# Patient Record
Sex: Male | Born: 1950 | Race: White | Hispanic: No | Marital: Single | Smoking: Never smoker
Health system: Southern US, Community
[De-identification: ages and names within clinical notes are randomized; demographics above are authoritative.]

## PROBLEM LIST (undated history)

## (undated) ENCOUNTER — Telehealth

## (undated) ENCOUNTER — Encounter: Attending: Family | Primary: Family

## (undated) ENCOUNTER — Telehealth
Attending: Pharmacist Clinician (PhC)/ Clinical Pharmacy Specialist | Primary: Pharmacist Clinician (PhC)/ Clinical Pharmacy Specialist

## (undated) ENCOUNTER — Ambulatory Visit: Payer: MEDICARE

## (undated) ENCOUNTER — Non-Acute Institutional Stay: Payer: MEDICARE

## (undated) ENCOUNTER — Telehealth: Attending: Family | Primary: Family

## (undated) ENCOUNTER — Encounter: Attending: "Endocrinology | Primary: "Endocrinology

## (undated) ENCOUNTER — Ambulatory Visit

## (undated) ENCOUNTER — Encounter: Attending: Radiation Oncology | Primary: Radiation Oncology

## (undated) ENCOUNTER — Ambulatory Visit: Payer: MEDICARE | Attending: "Endocrinology | Primary: "Endocrinology

## (undated) ENCOUNTER — Ambulatory Visit: Attending: Neurology | Primary: Neurology

## (undated) ENCOUNTER — Encounter

## (undated) ENCOUNTER — Encounter: Payer: MEDICARE | Attending: Family | Primary: Family

## (undated) ENCOUNTER — Ambulatory Visit: Payer: Medicare (Managed Care)

## (undated) ENCOUNTER — Telehealth
Attending: Student in an Organized Health Care Education/Training Program | Primary: Student in an Organized Health Care Education/Training Program

## (undated) ENCOUNTER — Encounter: Attending: Gastroenterology | Primary: Gastroenterology

## (undated) ENCOUNTER — Ambulatory Visit: Attending: Radiation Oncology | Primary: Radiation Oncology

## (undated) ENCOUNTER — Encounter: Attending: Audiologist | Primary: Audiologist

## (undated) ENCOUNTER — Ambulatory Visit: Payer: Medicare (Managed Care) | Attending: "Endocrinology | Primary: "Endocrinology

## (undated) ENCOUNTER — Ambulatory Visit: Payer: MEDICARE | Attending: Pulmonary Disease | Primary: Pulmonary Disease

## (undated) ENCOUNTER — Telehealth: Attending: Neurology | Primary: Neurology

## (undated) ENCOUNTER — Telehealth: Attending: Radiation Oncology | Primary: Radiation Oncology

## (undated) ENCOUNTER — Encounter
Attending: Student in an Organized Health Care Education/Training Program | Primary: Student in an Organized Health Care Education/Training Program

## (undated) ENCOUNTER — Telehealth: Attending: Anesthesiology | Primary: Anesthesiology

## (undated) ENCOUNTER — Ambulatory Visit: Attending: Sports Medicine | Primary: Sports Medicine

## (undated) ENCOUNTER — Encounter: Attending: Neurological Surgery | Primary: Neurological Surgery

## (undated) ENCOUNTER — Encounter: Payer: MEDICARE | Attending: Clinical | Primary: Clinical

## (undated) ENCOUNTER — Ambulatory Visit: Payer: Medicare (Managed Care) | Attending: Otology & Neurotology | Primary: Otology & Neurotology

## (undated) ENCOUNTER — Ambulatory Visit
Attending: Pharmacist Clinician (PhC)/ Clinical Pharmacy Specialist | Primary: Pharmacist Clinician (PhC)/ Clinical Pharmacy Specialist

## (undated) ENCOUNTER — Encounter
Payer: MEDICARE | Attending: Student in an Organized Health Care Education/Training Program | Primary: Student in an Organized Health Care Education/Training Program

## (undated) ENCOUNTER — Encounter: Payer: MEDICARE | Attending: Gastroenterology | Primary: Gastroenterology

## (undated) ENCOUNTER — Encounter: Attending: General Practice | Primary: General Practice

## (undated) ENCOUNTER — Ambulatory Visit: Attending: Rheumatology | Primary: Rheumatology

## (undated) ENCOUNTER — Ambulatory Visit: Payer: Medicare (Managed Care) | Attending: Radiation Oncology | Primary: Radiation Oncology

## (undated) ENCOUNTER — Ambulatory Visit: Attending: Family Medicine | Primary: Family Medicine

## (undated) ENCOUNTER — Encounter: Attending: Sports Medicine | Primary: Sports Medicine

## (undated) ENCOUNTER — Encounter: Attending: Pain Medicine | Primary: Pain Medicine

## (undated) ENCOUNTER — Encounter: Attending: Otology & Neurotology | Primary: Otology & Neurotology

## (undated) ENCOUNTER — Encounter
Attending: Pharmacist Clinician (PhC)/ Clinical Pharmacy Specialist | Primary: Pharmacist Clinician (PhC)/ Clinical Pharmacy Specialist

## (undated) ENCOUNTER — Encounter: Payer: MEDICARE | Attending: Hand Surgery | Primary: Hand Surgery

## (undated) ENCOUNTER — Ambulatory Visit: Payer: MEDICARE | Attending: Orthopaedic Surgery | Primary: Orthopaedic Surgery

## (undated) DIAGNOSIS — I442 Atrioventricular block, complete: Secondary | ICD-10-CM

## (undated) DIAGNOSIS — Z950 Presence of cardiac pacemaker: Secondary | ICD-10-CM

## (undated) DIAGNOSIS — K759 Inflammatory liver disease, unspecified: Secondary | ICD-10-CM

## (undated) DIAGNOSIS — B1920 Unspecified viral hepatitis C without hepatic coma: Secondary | ICD-10-CM

## (undated) HISTORY — DX: Atrioventricular block, complete: I44.2

## (undated) HISTORY — PX: TONSILLECTOMY: SUR1361

## (undated) HISTORY — DX: Presence of cardiac pacemaker: Z95.0

## (undated) HISTORY — PX: LIVER BIOPSY: SHX301

## (undated) MED ORDER — ALENDRONATE 70 MG TABLET: ORAL | 0 days

## (undated) MED ORDER — OMEGA 3-DHA-EPA-FISH OIL 1,000 MG (120 MG-180 MG) CAPSULE: ORAL | 0 days

## (undated) MED ORDER — VITAMIN B COMPLEX ER TABLET,EXTENDED RELEASE: Freq: Every day | ORAL | 0 days

## (undated) MED ORDER — CHOLECALCIFEROL (VITAMIN D3) 250 MCG (10,000 UNIT) CAPSULE: ORAL | 0 days

## (undated) MED ORDER — DICLOFENAC 1 % TOPICAL GEL: Freq: Four times a day (QID) | TOPICAL | 0 days | PRN

---

## 1980-06-21 HISTORY — PX: KNEE ARTHROSCOPY: SUR90

## 2008-12-12 ENCOUNTER — Inpatient Hospital Stay: Payer: Self-pay | Admitting: Specialist

## 2017-01-10 ENCOUNTER — Encounter: Payer: Self-pay | Admitting: Podiatry

## 2017-01-10 ENCOUNTER — Ambulatory Visit (INDEPENDENT_AMBULATORY_CARE_PROVIDER_SITE_OTHER): Payer: Self-pay | Admitting: Podiatry

## 2017-01-10 ENCOUNTER — Ambulatory Visit: Payer: Self-pay

## 2017-01-10 VITALS — BP 110/70 | HR 72 | Resp 16

## 2017-01-10 DIAGNOSIS — M722 Plantar fascial fibromatosis: Secondary | ICD-10-CM

## 2017-01-10 DIAGNOSIS — M79673 Pain in unspecified foot: Secondary | ICD-10-CM

## 2017-01-10 NOTE — Progress Notes (Signed)
   Subjective:    Patient ID: Devin Hale, male    DOB: 10-04-1950, 66 y.o.   MRN: [id number]  HPI he presents today with chief complaint of pain to his arches bilaterally. Stasis been going on now for the past several months. He said the right one seems to be bothering him worse than the left one. He wears old orthotics that are about 64 years old. He states that he has followed arches and takes naproxen daily he also has pain to the medial aspect of the longitudinal arch left foot. He denies trauma.    Review of Systems  HENT: Positive for sinus pressure.   All other systems reviewed and are negative.      Objective:   Physical Exam: Vital signs are stable alert and oriented 3. Pulses are palpable. Neurologic sensorium is intact. Deep tendon reflexes are intact and muscle strength is normal. He does have some fluctuance of the posterior tibial tendon left and of the tibialis anterior tendon both at their insertion sites on the navicular and the medial cuneiforms respectively. There is some soft tissue swelling in that area. Otherwise no reproducible pain bilateral. Radiographs taken today do not demonstrate any type of osseus abnormalities in this area. Tenderness on palpation medially. Tubercle bilateral.        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 Plantar fasciitis bilateral.  Plan: He was casted for orthotics.

## 2017-01-26 ENCOUNTER — Encounter: Payer: Self-pay | Admitting: *Deleted

## 2017-02-09 ENCOUNTER — Ambulatory Visit: Payer: Self-pay | Admitting: Orthotics

## 2017-02-09 DIAGNOSIS — M722 Plantar fascial fibromatosis: Secondary | ICD-10-CM

## 2017-02-09 NOTE — Progress Notes (Signed)
Patient picked up F/O.  Fit well.  Patient argumentative about balance owed.  Will pay $76 in payments.

## 2017-03-15 ENCOUNTER — Telehealth: Payer: Self-pay | Admitting: Podiatry

## 2017-03-15 NOTE — Telephone Encounter (Signed)
Pt called asking about his orthotics that he got he has put it in his new balance but did not remove the other insert prior to wearing. He asked if it was ok to buy a very thin insert to wear since his orthotics are not full length.  I told pt he needed to take the other insert out and try the orthotics that way. I also talked with Liliane Channel and he said it would be ok for him to get a very thin insert to wear just nothing with support.   I have left a message for pt stating this and asked him to call if any further questions.

## 2017-06-21 HISTORY — PX: NASAL SINUS SURGERY: SHX719

## 2017-10-25 DIAGNOSIS — R7989 Other specified abnormal findings of blood chemistry: Secondary | ICD-10-CM | POA: Insufficient documentation

## 2017-10-25 DIAGNOSIS — F419 Anxiety disorder, unspecified: Secondary | ICD-10-CM | POA: Insufficient documentation

## 2017-11-10 DIAGNOSIS — R768 Other specified abnormal immunological findings in serum: Secondary | ICD-10-CM | POA: Insufficient documentation

## 2017-11-10 DIAGNOSIS — M2550 Pain in unspecified joint: Secondary | ICD-10-CM | POA: Insufficient documentation

## 2017-11-10 DIAGNOSIS — R600 Localized edema: Secondary | ICD-10-CM | POA: Insufficient documentation

## 2017-11-23 DIAGNOSIS — M170 Bilateral primary osteoarthritis of knee: Secondary | ICD-10-CM | POA: Insufficient documentation

## 2017-11-23 DIAGNOSIS — M25361 Other instability, right knee: Secondary | ICD-10-CM | POA: Insufficient documentation

## 2017-11-25 ENCOUNTER — Telehealth: Payer: Self-pay | Admitting: Gastroenterology

## 2017-11-25 NOTE — Telephone Encounter (Signed)
Left voice message with patient that we received his voice message. We were calling  due to a cancellation with Dr. Allen Norris. That appt was filled and we will continue to call if we have any other openings.  I also told him if he was calling regarding another matter to please call us back.

## 2017-12-01 ENCOUNTER — Telehealth: Payer: Self-pay | Admitting: Gastroenterology

## 2017-12-01 NOTE — Telephone Encounter (Signed)
Offered pt 12/28/17 apt for sooner apt pt declined

## 2017-12-12 ENCOUNTER — Encounter: Admit: 2017-12-12 | Discharge: 2017-12-12 | Payer: MEDICARE

## 2017-12-12 ENCOUNTER — Ambulatory Visit: Admit: 2017-12-12 | Discharge: 2017-12-12 | Payer: MEDICARE

## 2017-12-12 DIAGNOSIS — M79641 Pain in right hand: Principal | ICD-10-CM

## 2017-12-12 DIAGNOSIS — M79642 Pain in left hand: Secondary | ICD-10-CM

## 2017-12-12 DIAGNOSIS — M542 Cervicalgia: Principal | ICD-10-CM

## 2017-12-26 ENCOUNTER — Telehealth: Payer: Self-pay | Admitting: *Deleted

## 2017-12-26 NOTE — Telephone Encounter (Signed)
Copied from [place] [id number]. Topic: General - Other >> Dec 16, 2017 11:49 AM Yvette Rack wrote: Reason for CRM: pt calling to speak with practice Administrator wanting to know more about practice and Dr [name] she would like to be a new patient here   >> Dec 20, 2017 11:01 AM Bea Graff, NT wrote: Pt would like to speak with Devin Hale regarding speaking to him last week on 12/16/17.  >> Dec 26, 2017  1:27 PM Valla Leaver wrote: Devin Hale calling back to speak with practice [name]. He says he's called twice and has not heard back.

## 2017-12-27 ENCOUNTER — Telehealth: Payer: Self-pay

## 2017-12-27 NOTE — Telephone Encounter (Signed)
Entered in error

## 2017-12-28 ENCOUNTER — Ambulatory Visit: Payer: Medicare Other | Admitting: Gastroenterology

## 2018-01-02 ENCOUNTER — Ambulatory Visit: Payer: Medicare Other | Admitting: Gastroenterology

## 2018-01-03 ENCOUNTER — Encounter

## 2018-01-03 ENCOUNTER — Ambulatory Visit: Payer: Medicare Other | Admitting: Gastroenterology

## 2018-01-18 ENCOUNTER — Ambulatory Visit: Admit: 2018-01-18 | Discharge: 2018-01-18 | Payer: MEDICARE | Attending: Gastroenterology | Primary: Gastroenterology

## 2018-01-18 DIAGNOSIS — B182 Chronic viral hepatitis C: Principal | ICD-10-CM

## 2018-01-18 DIAGNOSIS — R198 Other specified symptoms and signs involving the digestive system and abdomen: Secondary | ICD-10-CM

## 2018-01-18 DIAGNOSIS — Z1159 Encounter for screening for other viral diseases: Secondary | ICD-10-CM

## 2018-01-18 LAB — COMPREHENSIVE METABOLIC PANEL
ALBUMIN: 4.1 g/dL (ref 3.5–5.0)
ALKALINE PHOSPHATASE: 115 U/L (ref 38–126)
ANION GAP: 8 mmol/L — ABNORMAL LOW (ref 9–15)
AST (SGOT): 97 U/L — ABNORMAL HIGH (ref 17–47)
BILIRUBIN TOTAL: 0.9 mg/dL (ref 0.0–1.2)
BLOOD UREA NITROGEN: 17 mg/dL (ref 7–21)
BUN / CREAT RATIO: 23
CALCIUM: 9.6 mg/dL (ref 8.5–10.2)
CHLORIDE: 106 mmol/L (ref 98–107)
CO2: 26 mmol/L (ref 22.0–30.0)
CREATININE: 0.73 mg/dL (ref 0.70–1.30)
EGFR CKD-EPI AA MALE: >90 mL/min/{1.73_m2} (ref >=60)
EGFR CKD-EPI NON-AA MALE: >90 mL/min/{1.73_m2} (ref >=60)
GLUCOSE RANDOM: 90 mg/dL (ref 65–99)
POTASSIUM: 3.9 mmol/L (ref 3.5–5.0)
PROTEIN TOTAL: 7.6 g/dL (ref 6.5–8.3)
SODIUM: 140 mmol/L (ref 135–145)

## 2018-01-18 LAB — CBC W/ AUTO DIFF
EOSINOPHILS ABSOLUTE COUNT: 0.1 10*9/L (ref 0.0–0.4)
EOSINOPHILS RELATIVE PERCENT: 1.3 %
HEMATOCRIT: 45.1 % (ref 41.0–53.0)
HEMOGLOBIN: 14.5 g/dL (ref 13.5–17.5)
LARGE UNSTAINED CELLS: 1 % (ref 0–4)
LYMPHOCYTES ABSOLUTE COUNT: 1.5 10*9/L (ref 1.5–5.0)
LYMPHOCYTES RELATIVE PERCENT: 18.8 %
MEAN CORPUSCULAR HEMOGLOBIN: 27.7 pg (ref 26.0–34.0)
MEAN CORPUSCULAR VOLUME: 85.9 fL (ref 80.0–100.0)
MEAN PLATELET VOLUME: 8.1 fL (ref 7.0–10.0)
MONOCYTES ABSOLUTE COUNT: 0.4 10*9/L (ref 0.2–0.8)
MONOCYTES RELATIVE PERCENT: 5.1 %
NEUTROPHILS RELATIVE PERCENT: 73 %
PLATELET COUNT: 152 10*9/L (ref 150–440)
RED BLOOD CELL COUNT: 5.25 10*12/L (ref 4.50–5.90)
RED CELL DISTRIBUTION WIDTH: 13.7 % (ref 12.0–15.0)
WBC ADJUSTED: 8.2 10*9/L (ref 4.5–11.0)

## 2018-01-18 LAB — THYROID STIMULATING HORMONE: Thyrotropin:ACnc:Pt:Ser/Plas:Qn:: 1.634

## 2018-01-18 LAB — RHEUMATOID FACTOR: Rheumatoid factor:ACnc:Pt:Ser/Plas:Qn:: <8.6

## 2018-01-18 LAB — FERRITIN: Ferritin:MCnc:Pt:Ser/Plas:Qn:: 262

## 2018-01-18 LAB — BASOPHILS RELATIVE PERCENT: Lab: 0.7

## 2018-01-18 LAB — PROTIME: Lab: 13.3 — ABNORMAL HIGH

## 2018-01-18 LAB — ANION GAP: Anion gap 3:SCnc:Pt:Ser/Plas:Qn:: 8 — ABNORMAL LOW

## 2018-01-18 LAB — TOTAL IRON BINDING CAPACITY (CALC): Lab: 339.2

## 2018-01-18 LAB — IRON & TIBC
IRON: 75 ug/dL (ref 35–165)
TRANSFERRIN: 269.2 mg/dL (ref 200.0–380.0)

## 2018-01-18 LAB — AFP-TUMOR MARKER: Alpha-1-Fetoprotein.tumor marker:MCnc:Pt:Ser/Plas:Qn:: 7

## 2018-01-18 LAB — ERYTHROCYTE SEDIMENTATION RATE: Lab: 13

## 2018-01-18 LAB — CREATINE KINASE TOTAL: Creatine kinase:CCnc:Pt:Ser/Plas:Qn:: 52

## 2018-01-18 NOTE — Unmapped (Signed)
[facility]    Peter Mejia, M.D.  Professor of Medicine  Director, [facility]  [place] [facility] [place] [facility] [place]    [phone]    Peter Mejia    Peter Redwood, MD  [address]  [place], [place] [address]     Chief complaint: Patient referred for consultation for chronic hepatitis C genotype 1a.    Present illness: Patient is a 67 y.o. Caucasian male with chronic hepatitis C for at least 30 years.  He was found to have an abnormal ALT in the 1970s.  Interestingly, he was treated at the [facility] and I evaluated him there during my hepatology fellowship between [phone].  At the [facility] by his description, he was treated with interferon monotherapy which she did not tolerate well and then he was treated with ribavirin monotherapy.    Since that time he has had periodic laboratory testing but did not get treatment for hepatitis C.  He states he did well until sometime in December when he had a marked change in his clinical status.  He developed extensive fatigue, mild right upper quadrant discomfort, generalized pain manifested by myalgias and arthralgias, as well as generalized muscle weakness.  He denies nausea, vomiting, abdominal pain, chest pain, or shortness of breath.  Patient states he was seen by a rheumatologist at the [facility] clinic I do have some serologies available to me which were negative although I do not have a full consultation notes at this time.     After the patient developed all the symptoms, he called the [facility] and spoke to Dr. Nickola Major who recommended that he be evaluated at [facility].    10 system review of systems shows numerous somatic complaints as described above.    Past medical history:  1.  Resolved depression  2.  No history of diabetes, coronary artery disease, lung disease, or hypertension  4.  No prior hospitalizations  5.  Immune to hepatitis A  6.  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     No Known Allergies    Current Outpatient Medications   Medication Sig Dispense Refill   ??? NAPROXEN, BULK, MISC 400 mg.       No current facility-administered medications for this visit.      Social history: The patient is not married.  He does not have any children.  He formerly did some work from [facility] but stopped last year when he started feeling poorly.  He does not smoke cigarettes or drink alcohol.    Family history: Negative for liver disease or liver cancer.    BP 115/75  - Pulse 70  - Temp 37 ??C (98.6 ??F)  - Resp 20  - Ht 177.8 cm (5' 10)  - Wt 75.8 kg (167 lb)  - SpO2 98%  - BMI 23.96 kg/m??     Pleasant individual in NAD    HEENT: Sclera are anicteric, no temporal muscle loss, oropharynx is negative  NECK: No thyromegaly or lymphadenopathy, No carotid bruits  Chest: Clear to auscultation and percussion  Heart: S1, S2, RR, No murmurs  Abdomen: Soft, non-tender, non-distended, no hepatosplenomegaly, no masses appreciated, no ascites  Skin: No spider angiomata, No rashes  Extremities: Without pedal edema, no palmar erythema, no muscle tenderness.  Neuro: Grossly intact, No focal deficits, strength 3/4 in lower and upper extremities.      Results for orders placed or performed in visit  on 01/18/18   Rheumatoid Factor   Result Value Ref Range    Rheumatoid Factor <8.6 0.0 - 15.0 IU/mL   CK   Result Value Ref Range    Creatine Kinase, Total 52.0 45.0 - 250.0 U/L   Comprehensive Metabolic Panel   Result Value Ref Range    Sodium 140 135 - 145 mmol/L    Potassium 3.9 3.5 - 5.0 mmol/L    Chloride 106 98 - 107 mmol/L    CO2 26.0 22.0 - 30.0 mmol/L    Anion Gap 8 (L) 9 - 15 mmol/L    BUN 17 7 - 21 mg/dL    Creatinine 1.61 0.96 - 1.30 mg/dL    BUN/Creatinine Ratio 23     EGFR CKD-EPI Non-African American, Male >90 >=60 mL/min/1.60m2    EGFR CKD-EPI African American, Male >90 >=60 mL/min/1.55m2    Glucose 90 65 - 99 mg/dL    Calcium 9.6 8.5 - 04.5 mg/dL    Albumin 4.1 3.5 - 5.0 g/dL    Total Protein 7.6 6.5 - 8.3 g/dL    Total Bilirubin 0.9 0.0 - 1.2 mg/dL    AST 97 (H) 17 - 47 U/L    ALT 74 (H) 13 - 69 U/L    Alkaline Phosphatase 115 38 - 126 U/L   PT-INR   Result Value Ref Range    PT 13.3 (H) 10.2 - 12.8 sec    INR 1.17    Ferritin   Result Value Ref Range    Ferritin 262.0 27.0 - 377.0 ng/mL   Iron Level and TIBC   Result Value Ref Range    Iron 75 35 - 165 ug/dL    TIBC 409.8 119.1 - 478.2 mg/dL    Transferrin 956.2 130.8 - 380.0 mg/dL    Iron Saturation (%) 22 20 - 50 %   AFP tumor marker   Result Value Ref Range    AFP-Tumor Marker 7 <8 ng/mL   Hepatitis B Surface Antigen   Result Value Ref Range    Hepatitis B Surface Ag Nonreactive Nonreactive   Hepatitis B Core Antibody, total   Result Value Ref Range    Hep B Core Total Ab Nonreactive Nonreactive   Hepatitis B Surface Antibody   Result Value Ref Range    Hep B S Ab Reactive (A) Nonreactive, Grayzone    Hepatitis B Surface Ab Quant 16.12 (H) <8.00 m(IU)/mL   ESR   Result Value Ref Range    Sed Rate 13 0 - 20 mm/h   TSH   Result Value Ref Range    TSH 1.634 0.600 - 3.300 uIU/mL   CBC w/ Differential   Result Value Ref Range    WBC 8.2 4.5 - 11.0 10*9/L    RBC 5.25 4.50 - 5.90 10*12/L    HGB 14.5 13.5 - 17.5 g/dL    HCT 65.7 84.6 - 96.2 %    MCV 85.9 80.0 - 100.0 fL    MCH 27.7 26.0 - 34.0 pg    MCHC 32.3 31.0 - 37.0 g/dL    RDW 95.2 84.1 - 32.4 %    MPV 8.1 7.0 - 10.0 fL    Platelet 152 150 - 440 10*9/L    Neutrophils % 73.0 %    Lymphocytes % 18.8 %    Monocytes % 5.1 %    Eosinophils % 1.3 %    Basophils % 0.7 %    Absolute Neutrophils 6.0 2.0 - 7.5 10*9/L  Absolute Lymphocytes 1.5 1.5 - 5.0 10*9/L    Absolute Monocytes 0.4 0.2 - 0.8 10*9/L    Absolute Eosinophils 0.1 0.0 - 0.4 10*9/L    Absolute Basophils 0.1 0.0 - 0.1 10*9/L    Large Unstained Cells 1 0 - 4 %         Impression:  1.  Chronic hepatitis C genotype 1a.  Long-standing duration of at least 40 years. Today we discussed the natural history of HCV infection, including the risks for progression to cirrhosis, liver failure, liver cancer, and risks of hepatocellular carcinoma. Patient is aware of the need for continued follow-up and monitoring.  We discussed the importance of remaining abstinent from alcohol due to additive effects on disease progression to cirrhosis.  We discussed potential treatment options that include all-oral regimens with low rates of side effects and high rates of cure (sustained virological response).     Patient will meet with our pharmacist Erskine Squibb to review treatment options.    Addendum: FibroScan result 12.3 kPa consistent with stage F4 borderline cirrhosis.  Patient remains well compensated and I have reassured him about this finding.  Plan remains the same.    2.  Multiple somatic complaints including generalized weakness, throughout his and myalgias.  Patient apparently was evaluated by rheumatologist in [facility].  I do not have all those results available to me at this time.  We will check rheumatoid factor and cryoglobulins to see if there is any hepatitis C associated extrahepatic manifestation that could be implicated.  Patient should continue to follow-up with his primary physician and his rheumatologist as well.  If any of his symptomatology is related to hepatitis C then curing the infection hopefully will improve his symptomatology.    3.  Check for exposure and immunity to hepatitis B and vaccinate as needed.ADDENDUM-IMMUNE TO HBV    4.  HCC surveillance: Patient has evidence of well compensated cirrhosis.  We will perform abdominal MRI for [facility] surveillance.  We will also look to see if he has other evidence of lymphadenopathy that could contribute to his generalized symptomatology.    The patient will return for follow-up in 3 months or sooner once hepatitis C medications have been started to see Peter Shark, DNP.    Peter Mejia, M.D.  Professor of Medicine  Director, [facility]  [place] [facility] [place] [facility] [place]    [phone]

## 2018-01-18 NOTE — Unmapped (Addendum)
[facility]  FAST ??? Fibrosis Assessment Team  Division of Gastroenterology and Hepatology  ??  ??    ??  FIBROSCAN will be performed to assess hepatic fibrosis (scarring) in order to stage this patient's liver disease. This will assist with evaluating the natural course of the disease and will provide important information regarding prognosis, duration of therapy, and potential response to treatment. This information will also help assess risk for hepatocellular carcinoma and need for liver cancer surveillance.??  ??  FibroscanProcedure:   After obtaining verbal consent, the patient was placed in a supine position. Physical characteristics and landmarks were assessed to establish appropriate mid-axillary intercostal space for probe placement. 50Hz  Shear Wave pulses were applied and the resulting Shear Wave and Propagation Speed detected with a 3.5 MHz ultrasonic signal, using the FibroScan probe.  Skin to liver capsule distance and liver parenchyma were accessed during the entire examination with the FibroScan probe. The patient was instructed to breathe normally and to abstain from sudden movements during the procedure, allowing for random measurements of liver stiffness. At least ten Shear Waves were produced; individual measurements of each Shear Wave were calculated. Patient tolerated the procedure well and was discharged without incident.  ??  Probe used []  M+    Serial # [id number]                         [x]  XL+   Serial # [id number]  ??  Main Etiology of Liver Disease:  [x] HCV     [] HBV   [] Alcohol    [] NASH  [] PBC      [] PSC     [] Other________________  ??  50Hz  shear wave pulses were applied and the resulting shear wave and propagation speed detected with a 3.5 MHz ultrasonic signal, using the FibroScan probe.     ??  At least ten Shear Waves were produced; individual measurements of each shear wave were calculated.    ??  Patient tolerated the procedure well and was discharged without incident.  ??  Fibroscan score: ___12.3___kPa  ??  IQR:                       ___15___%  ??  Test performed by: Luiz Ochoa, RN  ??  [facility]  FAST ??? Fibrosis Assessment Team  Division of Gastroenterology and Hepatology  ??  ??    ??  ??  ??  Estimation of the stage of liver fibrosis (Metavir Score):  The results of the Liver Stiffness Score are consistent with the following liver fibrosis stage:  ??  ??           Borderline  []  F0-F1             []  F2               []  F3               [x]  F4  ??  ??  GENERAL RECOMMENDATIONS ACCORDING TO THE STAGE OF LIVER FIBROSIS.  ??  F0-F1: No-minimal fibrosis. The risk of progression to advanced fibrosis and cirrhosis is low. If the cause of liver disease is not removed, a 1-2 yr follow-up study is recommended.   F2: Significant fibrosis. There is a moderate risk of progression to cirrhosis. If the cause of liver disease is not removed, a follow-up study in 12 months is recommended.  F3: Advanced (pre-cirrhotic stage). The risk of progression  to cirrhosis is high. Imaging studies to rule out hepatocellular carcinoma should be considered. Efforts to remove the cause of liver disease are highly recommended.  F4: Cirrhosis. There is significant risk of portal hypertension and esophageal varices. An upper endoscopy is recommended. Imaging studies for hepatocellular carcinoma screening are recommended.   ??  Any and all FibroScan studies must be carefully evaluated, taking fully into account all individual measurement/scans, patient history and other factors.  As with liver biopsy, any estimation of liver fibrosis may be subject to under or over staging due to sampling error.  Any further medical or surgical intervention should be made only while fully considering the circumstances of this patient and in consultation with this patient.    I have reviewed and interpreted the FIBROSCAN test results as described above.

## 2018-01-18 NOTE — Unmapped (Signed)
Chronic hepatitis C. You will be a good candidate for treatment with medications that have a high rate of cure and a low risk of side effects. You will meet our pharmacist Erskine Squibb to review treatment options. Labs and FIBROSCAN today.  We are also checking on possible causes of muscle weakness that could be related to hepatitis C or other causes. Liver MRI scheduled for a different day. Return to office in 3 months or sooner once hepatitis C medications have been started. Continue to followup with Dr. Ephraim Hamburger as well.

## 2018-01-18 NOTE — Unmapped (Signed)
Counseling for HCV treatment     B18.2 Hep C: yes    K74.60 Cirrhosis: yes,   Child Pugh Score if applicable and for Medicaid pts: 5  Z94.4 Liver Transplant: no    Genotype: 1a (Ref Att 12/28/17 8:49 pg 3)  HCV RNA: 891,000 IU/ml on 09/05/17 (Ref Att Lab 2, pg 2)  Fibrosis score: F3-4 (12.3 kPa) on Fibroscan on 01/18/18  HIV Co-infection? no  Signs of liver decompensation? no  Previous treatment? YES, ribavirin and interferon    Planned regimen: Epclusa (sofosbuvir/velpatasvir 400/100mg ) x 12 weeks  Urgency: Routine Request  Prescribing Provider/NPI: Dr. Gavin Potters / [id number]  Please see Media tab under Chart Review on date 01/18/18 for patient signature/waiver forms.  Insurance: [employer] Part D    Peter Mejia is a 67 y.o. Caucasian male presents to clinic alone and is interested in starting treatment with Epclusa. We discussed the prior authorization (PA) process of obtaining the medication through insurance and that this may take some time.  Patient reporting progressive muscle weakness and joint pains which started about 6 months ago.     Current medications: Naproxen 220mg  2 tabs BID    Following topics were discussed during counseling:    1. Indications for medication, dosage and administration.     A. Epclusa 400/100mg  1 tablet to take daily with or without food.    2. Common side effects of medications and management strategies. (fatigue, headache)    3. Importance of adherence to regimen, follow-up clinic visits and lab monitoring.     A.Asked patient to call Ramah Kras [phone] before starting treatment to establish start date and to schedule TW#4 appointment.    4. Drug-drug interaction.    A. Current medications have been reviewed and assessed for possible interaction. Advised to check with MD or pharmacist before taking any OTC/herbal medications, with emphasis regarding indigestion/heartburn medications.  Denies use of herbal medication such as milk thistle or St. John's wart.  Allergies have been verified. Endorses seldom alcohol.   5. Importance of informing pharmacy and clinic of updated contact information.  Discussed the process of obtaining medication through specialty pharmacy and when approved medication will be delivered to patient's home. Stressed importance of being able to be reached over the phone.    Patient verbalized understanding. Provided contact information for any questions/concerns.       Park Breed, Pharm D., BCPS, BCGP, CPP  [facility]  [address]  [place], [place] [address]  [phone]    This portion of the visit was 30 minutes in duration and greater than 50% was spend in direct counseling and coordination of care regarding hepatitis C medication management.

## 2018-01-20 LAB — HEPATITIS B CORE TOTAL ANTIBODY: Hepatitis B virus core Ab:PrThr:Pt:Ser/Plas:Ord:IA: NONREACTIVE

## 2018-01-20 LAB — HEPATITIS B SURFACE ANTIGEN: Hepatitis B virus surface Ag:PrThr:Pt:Ser:Ord:: NONREACTIVE

## 2018-01-20 LAB — HEPATITIS B SURFACE ANTIBODY QUANT: Hepatitis B virus surface Ab:ACnc:Pt:Ser:Qn:: 16.12 — ABNORMAL HIGH

## 2018-01-23 LAB — CRYOGLOBULIN: Cryoglobulin/Serum.total:VFr:Pt:Ser:Qn:Spun Westergren tube: NEGATIVE

## 2018-01-23 NOTE — Unmapped (Signed)
HE SPEED Outpatient Study Visit:    This is the patient's (SID: [id number]) first assessment of their enrollment visit.    The patient was identified via chart review    Their inclusion/exclusion criteria were evaluated by Vanessa Ralphs.     Their ability to provide consent was assessed by Vanessa Ralphs and they were deemed to have the decisional capacity to provide consent.    The patient provided written consent on 01/18/18 at 2 pm.    The patient was presented the informed consent form by the study coordinator and was agreeable to participate in the study with all questions, if applicable, having been answered to their satisfaction.    Clinical information as follows:    Etiology of cirrhosis: HCV    BMI: 23.96    MELD-Na score: 8 at 01/18/2018  4:56 PM  MELD score: 8 at 01/18/2018  4:56 PM  Calculated from:  Serum Creatinine: 0.73 mg/dL (Rounded to 1 mg/dL) at 1/61/0960  4:54 PM  Serum Sodium: 140 mmol/L (Rounded to 137 mmol/L) at 01/18/2018  4:56 PM  Total Bilirubin: 0.9 mg/dL (Rounded to 1 mg/dL) at 0/98/1191  4:78 PM  INR(ratio): 1.17 at 01/18/2018  4:56 PM  Age: 67 years

## 2018-01-24 ENCOUNTER — Encounter: Admit: 2018-01-24 | Discharge: 2018-01-25 | Payer: MEDICARE

## 2018-01-24 DIAGNOSIS — B182 Chronic viral hepatitis C: Principal | ICD-10-CM

## 2018-01-26 MED ORDER — EPCLUSA 400 MG-100 MG TABLET
ORAL | 2 refills | 0 days
Start: 2018-01-26 — End: 2018-03-15

## 2018-01-26 MED ORDER — SOFOSBUVIR 400 MG-VELPATASVIR 100 MG TABLET
ORAL_TABLET | Freq: Every day | ORAL | 0 refills | 0.00000 days | Status: CP
Start: 2018-01-26 — End: 2018-06-12
  Filled 2018-01-31: qty 28, 28d supply, fill #0

## 2018-01-26 MED ORDER — SOFOSBUVIR 400 MG-VELPATASVIR 100 MG TABLET: 1 | tablet | Freq: Every day | 2 refills | 0 days | Status: AC

## 2018-01-27 NOTE — Unmapped (Signed)
[facility] Medication Referral: PA APPROVED    Medication (Brand/Generic): EPCLUSA    Initial FSI Test Claim completed with resulted information below:  No PA required  Patient ABLE to fill at [facility] Company:  [employer]  Anticipated Copay: $3.80  Is anticipated copay with a copay card or grant? NO    As Co-pay is under $100 defined limit, per policy there will be no further investigation of need for financial assistance at this time unless patient requests. This referral has been communicated to the provider and handed off to the [facility] team for further processing and filling of prescribed medication.   ______________________________________________________________________  Please utilize this referral for viewing purposes as it will serve as the central location for all relevant documentation and updates.

## 2018-01-27 NOTE — Unmapped (Signed)
Reason for call: Pt called to find out when he can start treatment and had questions about pain management    Hep C  Genotype:1a (Ref Att 12/28/17 8:49 pg 3)  Planned Treatment: Epclusa x 12 wks  Fibrosis: F3-4 (12.3 kPa) on Fibroscan on 01/18/18    Pt called to let us know he received a message from [facility] that he is approved for Epclusa and wanted to know when he is able to start treatment. Notified pt the next step is for Jane CPP to call and provide counseling. The patient will be given complete instructions on the next steps once counseling has been completed. Pt asked if the counseling will be provided today. Notified pt that Erskine Squibb CPP will not be calling today but I will be happy to let her know he called. Pt stated he is very anxious to start tx. Pt also stated he is having pain and the Tylenol is not helping and wanted to see if Erskine Squibb could prescribe something stronger. Advised pt to follow up with his PCP on pain management. Pt stated his PCP prescribed oxycodone and he is going to have his prescription filled today.     Vertell Limber RN, BSN  Nursing Care Coordinator   Pharmacy Adult GI Medicine  [facility]  [address]   [place], [place] [address]  [phone]

## 2018-01-30 NOTE — Unmapped (Signed)
Initial Counseling for HCV Treatment     Planned regimen: Epclusa (sofosbuvir/velpatasvir 400/100mg ) x 12 weeks  Planned start date: 02/01/18    Pharmacy: [facility] [phone] option #4    PMH:  1.  Resolved depression  2.  No history of diabetes, coronary artery disease, lung disease, or hypertension  3.  No prior hospitalizations  4.  Immune to hepatitis A  5.  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   ??  Current medications: naproxen 220mg  2 tabs BID, MVI prn (Has not taken in months)    Patient is ready to start treatment with Epclusa. Patient inquiring about pain medication adjustment. Recommend to follow up with PCP and request a referral to pain clinic. Patient in agreement.     Following topics were discussed during counseling:   Patient Counseling    Counseled the patient on the following:  doses and administration discussed, safe handling, storage, and disposal discussed, possible adverse effects and management discussed, possible drug and prescription drug interactions discussed, possible drug and OTC drug and food interactions discussed, lab monitoring and follow-up discussed, cost of medications and cost implications discussed, adherence and missed doses discussed, pharmacy contact information discussed          1. Indications for medication, dosage and administration.     A. Epclusa 400/100mg  1 tablet to take daily with or without food. Patient plans to take in the evenings.     2. Common side effects of medications and management strategies. (fatigue, headache)      3. Importance of adherence to regimen, follow-up clinic visits and lab monitoring.   Medication Adherence    Demonstrates understanding of importance of adherence:  yes  Informant:  patient  Patient is at risk for Non-Adherence:  No  Reasons for non-adherence:  no problems identified  Confirmed plan for next specialty medication refill:  delivery by pharmacy         A. Asked patient to call [facility] [phone] to establish start date for treatment and to schedule appointment 4 weeks before starting treatment.      4. Drug-drug interaction.  Drug Interactions    Drug interactions evaluated:  yes  Clinically relevant drug interactions identified:  no         A. Current medications have been reviewed and assessed for possible interaction.  We discussed the mechanism of drug-drug interaction with acid lowering agents. If to restart MVI, advised to separate by at least 4 hours from [place]. Advised to check with MD or pharmacist before taking any OTC/herbal medications, with emphasis regarding indigestion/heartburn medications.  Denies use of herbal medication such as milk thistle or [place]'s wart.  Allergies have been verified. Denies alcohol.     5. Importance of informing pharmacy and clinic of updated contact information. Advised patient to call pharmacy when down to about 7 day supply left to ensure there's no interruption in therapy.  Refill Coordination    Has the Patients' Contact Information Changed:  No  Is the Shipping Address Different:  No       Shipping Information    Delivery Date:  02/01/18  Medications to be Shipped:  Epclusa       Patient verbalized understanding. Provided contact information for any questions/concerns.     Park Breed, Pharm D., BCPS, BCGP, CPP  [facility]  [address]  [place], [place] [address]  [phone]    January 30, 2018 10:03 AM

## 2018-01-31 MED FILL — EPCLUSA 400 MG-100 MG TABLET: 28 days supply | Qty: 28 | Fill #0 | Status: AC

## 2018-02-03 DIAGNOSIS — M6281 Muscle weakness (generalized): Secondary | ICD-10-CM | POA: Insufficient documentation

## 2018-02-03 DIAGNOSIS — M791 Myalgia, unspecified site: Secondary | ICD-10-CM | POA: Insufficient documentation

## 2018-02-06 NOTE — Unmapped (Signed)
Patient called to report of having a bad weekend of pain. He reports he was seen by rheumatologist on Friday (See note in Care Everywhere) and was prescribed tramadol. Advised tramadol would be ok to take while on Epclusa. Patient asks if he can take tramadol more than 1 tab BID. I instructed pt to take no more than as prescribed but he may request his rheumatologist to see if he can be prescribed to take more (ex. q6h prn). His rheumatologist recommend referral to neurologist and I encouraged pt to follow up with neurologist for further workup as pt's RF and cryo were both negative. Pt verbalized understanding. No issues with Epclusa per patient.

## 2018-02-15 ENCOUNTER — Encounter: Admit: 2018-02-15 | Discharge: 2018-02-16 | Payer: MEDICARE

## 2018-02-15 DIAGNOSIS — M542 Cervicalgia: Secondary | ICD-10-CM | POA: Insufficient documentation

## 2018-02-15 DIAGNOSIS — M4802 Spinal stenosis, cervical region: Secondary | ICD-10-CM | POA: Insufficient documentation

## 2018-02-15 DIAGNOSIS — G894 Chronic pain syndrome: Secondary | ICD-10-CM

## 2018-02-15 DIAGNOSIS — R292 Abnormal reflex: Secondary | ICD-10-CM

## 2018-02-15 DIAGNOSIS — M170 Bilateral primary osteoarthritis of knee: Secondary | ICD-10-CM

## 2018-02-15 NOTE — Unmapped (Signed)
Thank you for being seen at the [facility] pain management clinic today  I have order thoracic spine as well as lumbar spine MRI -I will contact you upon obtaining the results  You may also choose to discuss those results with neurosurgery when you see them  Please continue your current management in terms of using Tylenol and tramadol as per your hepatologist  Please discuss with hepatologist possibility of doing a steroid taper as well as utilization of Voltaren gel to joints on as-needed basis before refilling the medication which I will prescribe to you today  I am also prescribing your TENS unit that you can apply to painful areas on as-needed basis  We will place referral to pain psychology for teaching of pain coping skills  Please try to schedule follow-up appointment with me as well as appointment with pain psychology on the same day  Return after completion of MRIs

## 2018-02-15 NOTE — Unmapped (Signed)
Faxed TENS unit RX to [facility].  Original RX scanned into Media tab of pt's chart

## 2018-02-15 NOTE — Unmapped (Signed)
Department of Anesthesiology  [facility]  [address]  [place], [place] [address]  [phone]    Date: February 15, 2018  Patient Name: Peter Mejia  MRN: [id number]  PCP: Marton Redwood  Referring Provider: Preston Fleeting*    Assessment:   Attending: Leighton Parody is a 67 y.o. male. He [facility] for chronic pain localized to cervical spine, lower back, and legs.    Patient presents to the clinic complaining of pain all over with his worse pain being localized to knees bilaterally, lumbar spine, and cervical spine. He reports that his pain started in December 2018, first presenting itself as flu symptoms. When his symptoms persisted, he scheduled a doctor's visit at which point he describes his pain migrated towards his right flank and RUQ. Being aware of prior diagnosis of Hep C (reportedly for >40 yrs), there was a concern for possible liver involvement. He was being seen by Dr. Annitta Needs and is diagnosed with liver cirrhosis. In March 2019, he was started on hepatitis C treatment, but despite initiation of the medication, his symptoms have not improved.     The etiology of patients diffuse body pain is certainly unknown at this point, however it is likely multifactorial in nature. Hepatitis C chronic infection is well known to contribute to diffuse body pain which in combination with age related OA changes can be quite debilitating. Hepatitis C infection causes chronic proinflammatory state which negatively influences pain. Despite diffuse nature of his pain, he does not exhibit tenderness at many of the fibromyalgia associated points. His pain is more discrete and joint focused. Based on his limited imaging he has evidence of OA changes in joints of hands as well as cervical spine with cervical spine MRI showing Multilevel cervical spondylosis, detailed above, worst at C5-6 where there is moderate canal stenosis and left foraminal stenosis. On physical exam however, the patient has +3 brisk DTRs in LEB, but not in UE as well as 5 beat ankle clonus bilaterally which certainly cannot be explained by cervical spinal stenosis, thus I would like to additionally obtain thoracic and lumbar spine MRI. Patient has neurosurgery appointment already scheduled, and I advised him to get the MRIs prior to his evaluation.     I have also discussed with the patient today various pharmacological options for pain management in the light of likely significant OA changes and stage 4 liver disease, and that this options are limited. He is currently taking tramadol 50mg  TID for 15 days (start date 02/09/2018) which I am in agreement with and 2 grams of tylenol daily (reportedly as per advise of hepatologist). The patient reports avoiding NSAIDs. Due to the diffuse nature of his pain I also advised that he discuss with hepatologist the possibility of doing a steroid taper. I have prescribed Voltaren 1% as it has inconsequential systemic absorption and may benefit the patient especially in terms of his joint pain. I have ordered a TENS unit. The patient also agreed to be seen by pain psychology for the purpose of teaching of pain coping skills and if opioids are needed for COM evaluation.      1. Chronic pain syndrome    2. Neck pain    3. Cervical spinal stenosis    4. Hyperreflexia of lower extremity    5. Bilateral primary osteoarthritis of knee        General Recommendations: The pain condition that  the patient suffers from is best treated with a multidisciplinary approach that involves an increase in physical activity to prevent de-conditioning and worsening of the pain cycle, as well as psychological counseling (formal and/or informal) to address the co-morbid psychological affects of pain.  Treatment will often involve judicious use of pain medications and interventional procedures to decrease the pain, allowing the patient to participate in the physical activity that will ultimately produce long-lasting pain reductions.  The goal of the multidisciplinary approach is to return the patient to a higher level of overall function and to restore their ability to perform activities of daily living.      Plan:     -Order MRI of Lumbar Spine  -Order MRI of Thoracic Spine  -Start Voltaren Gel if cleared by Hepatologist   - Patient advise to keep the neurosurgery appointment  - TENS unit  -Referral to Pain Psychology for COM and pain coping skills teaching  -Follow up after imaging      Orders Placed This Encounter   Procedures   ??? MRI Lumbar Spine Wo Contrast     Standing Status:   Future     Standing Expiration Date:   02/16/2019     Order Specific Question:   REASON FOR EXAM     Answer:   PAIN LUMBAR SPINE-LUMBAGO     Order Specific Question:   What is the patient's sedation requirement?     Answer:   No Sedation     Order Specific Question:   Does the patient have metallic implants or external cardiac devices?     Answer:   No Implants     Order Specific Question:   Performed at     Answer:   [facility]   ??? MRI Thoracic Spine Wo Contrast     Standing Status:   Future     Standing Expiration Date:   02/15/2019     Order Specific Question:   REASON FOR EXAM     Answer:   PAIN THORACIC SPINE     Order Specific Question:   What is the patient's sedation requirement?     Answer:   No Sedation     Order Specific Question:   Does the patient have metallic implants or external cardiac devices?     Answer:   No Implants     Order Specific Question:   Performed at     Answer:   [facility]   ??? AMBULATORY REFERRAL TO PAIN PSYCHOLOGY     Standing Status:   Future     Standing Expiration Date:   02/16/2019     Referral Priority:   Routine     Referral Type:   Generic Referral     Number of Visits Requested:   1     Requested Prescriptions      No prescriptions requested or ordered in this encounter         Subjective:     HPI:  Peter Mejia is seen in consultation at the request of Mancheno Revelo, Koleen Nimrod*  For evaluation and recommendations regarding His chronic in cervical paraspinals, generalized back pain and knee pain bilaterally.    Patient states that he first experienced symptoms December 2018 that he attributed to the flu. He saw a physican in March 2019 at which point he noticed pain migrating from his back anteriorly to RUQ. The pattern of his pain prompted his physician to think the pain was liver related. He has had liver issues for over  40 years and was recently started on hepatitis medication.     Patient also presented with secondary pain of arthritis in his knee and thumbs. Patient reports that he experiences pain essentially everywhere but pain in joints and his neck is the worst. He states that he only experiences relief when he is stationary taking both his tylenol and tramadol concurrently. He maintains that he takes the maximum dose advised by his hepatologist of 2000mg  tylenol per day. He states he had initial concerns regarding opioids as his doctor wanted to start him on oxycodone. He refused but was later put on tramadol which he benefits from.     Patient states that he initially did not desire interventional procedures, but has seen an orthopedist and decided to undergo Hyaluronic acid treatment for his knee pain. Patient reports that aside from his knees, the second worse pain he experiences is localized in his neck. He takes tramadol twice a day in between doses of tylenol. He states that tramadol provides some benefit. He finds that he receive best analgesia when he overlaps tylenol and tramadol. He denies trying topicals such as voltaren and lidocaine. He denies ever trying a TENS unit. He also denies having any implanted device.    Pain Clinic? No    Current Medications:  Tramadol 50mg  TID for 15 days (02/09/2018 start)  Tylenol 1000mg  BID    His pain started approximately  9 months ago.  His pain is localized to bilateral neck, shoulder, hands, upper back, lower back and knee.    His pain is described as aching, sharp, shooting and stabbing  Peter Mejia is  presenting to the clinic today with pain that is graded as 10/10 in intensity. His highest level of pain is reported as 10/10 in intensity, least pain level is 5/10 and average pain level is 9/10.  His pain started Over time.  His pain symptoms are associated with numbness, tingling, stiffness and swelling.  The patient has experienced lost of bowel or bladder control.  His pain is made worse with standing, walking and climbing stairs.  His pain is present all of the time   His pain is improved with medication and nothing.       Previous interventions include Medications    Previous tests include MRI and XRAYs  Workmans Compensation n/a involved.  This n/a involved with a lawsuit or lawyer  The treatment goals include Complete resolution and no medication, Return to previous occupation, Ability to return to some type of employment and Ability to return to previous daily routine and activity    Previous Medication Trials: Naproxen/Aleve and Tramadol    No past medical history on file.  No past surgical history on file.  No family history on file.    Social History:  He reports that he has never smoked. He has never used smokeless tobacco. He reports that he does not drink alcohol.    The patient is single  The patient has children: No  The patient is living alone  Highest level of education: some university/college  Current Employment: Unemployed/part-time, because of pain  Occupation: Former Scientist, water quality  Exercise: None  Recreational Drugs: Has tried previously  Treatment for Substance abuse: No  Use anothers prescription medications: No    Allergies as of 02/15/2018   ??? (No Known Allergies)      Current Outpatient Medications   Medication Sig Dispense Refill   ??? acetaminophen (TYLENOL) 100 mg/mL suspension Take 1,000 mg by mouth.     ???  EPCLUSA 400-100 mg tablet TAKE 1 TABLET BY MOUTH ONCE DAILY 28 each 2   ??? traMADol (ULTRAM) 50 mg tablet 1 tab as needed for pain, 3 times per day for fifteen days  Forty five tablets with no refills     ??? NAPROXEN, BULK, MISC 400 mg.       No current facility-administered medications for this visit.        Imaging/Tests: MRI Cervical Spine     EXAM: XR HAND 3 OR MORE VIEWS BILATERAL  DATE: 12/12/2017 1:45 PM  ACCESSION: [id number]  DICTATED: 12/12/2017 1:56 PM  INTERPRETATION LOCATION: [facility]    CLINICAL INDICATION: 67 years old Male with M79.641 - Pain in both hands - M79.642 - Pain in both hands ??    COMPARISON: None.    TECHNIQUE: PA, oblique and lateral views of the hands.    FINDINGS:   Remote fracture of the left long finger proximal phalanx with dorsal angulation. No evidence for recent fracture. There is bilateral first CMC subluxation with narrowing, sclerosis, geode swelling, and osteophytosis. Scattered round well-circumscribed lucencies in the carpal bones are likely degenerative geodes. Multifocal distal interphalangeal narrowing with osteophytosis, greatest at the right index and long fingers. No marginal erosions or chondrocalcinosis. No soft tissue swelling.      Impression     Multifocal osteoarthrosis of both hands, greatest at the first [facility] joints.             EXAM: Magnetic resonance imaging, spinal canal and contents, cervical without contrast material.  DATE: 12/12/2017 1:12 PM  ACCESSION: [id number] UN  DICTATED: 12/12/2017 3:13 PM  INTERPRETATION LOCATION: [facility]    CLINICAL INDICATION: 67 years old Male with neurological deficits ??- M54.2 - Pain, neck ??    COMPARISON: None    TECHNIQUE: Multiplanar multisequence MRI was performed through the cervical spine without intravenous contrast.    FINDINGS:   Bone marrow signal is heterogeneous. Spinal cord signal is normal.    At C2-3 there is Grade one C2 anterolisthesis. No significant disc bulging, canal or foraminal stenosis.    At C3-4 there is disc bulging and uncovertebral hypertrophy without significant canal stenosis. Mild to moderate left foraminal stenosis. No significant right foraminal stenosis.    At C4-5 there is disc bulging and uncovertebral hypertrophy without significant canal stenosis. Mild to moderate left foraminal stenosis. No significant right foraminal stenosis.    At C5-6 there is moderate disc height loss. Grade one C5 retrolisthesis. Disc bulging, and uncovertebral hypertrophy, and ligamentum flavum hypertrophy cause moderate canal stenosis and moderate left, mild right ??foraminal stenosis.    At C6-7 there is disc bulging, uncovertebral hypertrophy, and ligament flavum hypertrophy causing mild canal stenosis. Mild bilateral foraminal stenosis.    At C7-T1 there is no significant disc bulging, canal or foraminal stenosis.      Impression     --Multilevel cervical spondylosis, detailed above, worst at C5-6 where there is moderate canal stenosis and left foraminal stenosis.         Urine toxiciology screen  No results found for: AMPHU, BARBU, BENZU, CANNAU, METHU, OPIAU, COCAU    OPIOID CONFIRMATION:  No results found for: CDIFFTOX, NBUPR, LABCO, HYDROCODONE, HYDROMORPH, MORPHINE, OXYCODONE, OXMU, MAMU, OPIU     BENZODIAZEPINE CONFIRMATION:  No results found for: CDIFFTOX, CDIFFTOX, OHALP, CLONU, HDXYFLRAZUR, 7NHFLU, DESALKYCONF, LORAZURQT, HDXYTRIAZUR, MIDAZURQT, BNZU    Review of Systems:  GENERAL: weight loss, fatigue, daytime drowsiness  HEENT: blurred vision, deafness/hearing problems, problems swallowing, ear aches, bleeding gums  SKIN:itching, dryness  PULMONARY:shortness of breath  ENDOCRINE: excess urination, change in hair or skin texture, intolerance to heat, intolerance to cold, decreased sex drive, decreased sexual performance, difficulties with orgasm  GASTROINTESTINAL:constipation  GENITOURINARY:frequent urination  MUSCULOSKELETAL:joint aches/, joint swelling, back pain, muscle aches, muscle weakness and muscle wasting  CARDIOVASCULAR:shortness of breath on exertion and swelling of ankles/legs  NEUROLOGIC:dizzy/vertigo, excessive sleeping, coordination difficulty, migraines/headaches and numbness/tingling  PSYCHIATRIC: anxiety, low concentration, low energy, anger, emotional outbursts, lack of interests in activities and feeling hopeless/helpless  He denies any homicidal or suicidal ideation.   HEMATOLOGIC: none  IMMUNOLOGIC: HCV      Objective:     PHYSICAL EXAM:  Vitals:    02/15/18 1250   BP: 118/72   Pulse: 75   SpO2: 95%     Wt Readings from Last 3 Encounters:   01/18/18 75.8 kg (167 lb)   04/24/09 77.8 kg (171 lb 8.3 oz)     GENERAL:  The patient is a well developed, male and appears to be in no apparent distress. The patient is pleasant and interactive. He presented in a wheelchair and reports difficulty ambulating. Patient is a good historian.  No evidence of sedation or intoxication.  HEAD/NECK:    Reveals normocephalic/atraumatic. Mucous membranes are moist.  Oropharynx is clear.   Range of motion of cervical spine fairly preserved with limited extension  CARDIOVASCULAR:   Regular rate and regular rhythm  RESPIRATORY:   Clear to auscultation bilaterally. No wheezing noted.  EXTREMITIES:  no clubbing, cyanosis noted, some muscle atrophy and mild LEB edema was noted.  GASTROINTESTINAL:  +BSX4, nontender  NEUROLOGIC:    The patient was alert and oriented times 3 with normal language, attention, cognition.  Cranial nerve exam was normal.  Sensation Intact to light touch throughout the bilateral upper and lower extremities.  MUSCULOSKELETAL:    Appropriate muscle mass in the upper and lower extremities bilaterally.    Strength 5/5 in upper extremities to flexion and extension, +4/5 in LEB diffusely  REFLEXES:  Reflexes were +3 in BLE patellar,  +2 Achilles, +2 BUE, + clonus ankle 5 beats bilaterally  Preserved sensation in all extremities to touch, pin prick and cold swab  GAIT:  The patient rises from a seated position with some difficulty, finds ambulation challenging, gait wide based and unsteady, although he is able to rise to toes and heels with assistance, gait nonantalgic.   SPINE:  The patient does  have pain with palpation of the Cervical and lumbar paraspinal musculature and facets.  Surgical scaring is not noted on spine.  - SLRT bilaterally  JOINTS:  No acute synovitis noted, some OA changes, bilateral knee crepitus, no effusion, ROM preserved  SKIN:   No obvious rashes lesions or erythema on the exposed skin  PSYCHIATRIC:  Appropriate affect today in clinic    I have personally reviewed the patient's medical record.   I have personally reviewed the patient's clinical labs and/or urine toxicology studies  I have independently reviewed patient's imaging studies.   I have reviewed the [place] Narcotic Database today    ______________________________________________________________________    Documentation assistance was provided by Peggyann Juba, Scribe, on February 15, 2018 at 12:56 PM for Dr. Clarene Essex, MD.     ----------------------------------------------------------------------------------------------------------------------  February 19, 2018 6:14 PM. Documentation assistance provided by the Scribe. I was present during the time the encounter was recorded. The information recorded by the Scribe was done at my direction and has been reviewed and validated by me.  ----------------------------------------------------------------------------------------------------------------------

## 2018-02-17 NOTE — Unmapped (Signed)
Pt left a message on the nurse line wanting to coordinate his  Clinic appts and pain psych appts at [facility].  Please call pt to schedule.  Thanks, renee

## 2018-02-21 ENCOUNTER — Encounter: Admit: 2018-02-21 | Discharge: 2018-02-22 | Payer: MEDICARE

## 2018-02-21 DIAGNOSIS — R292 Abnormal reflex: Secondary | ICD-10-CM

## 2018-02-21 DIAGNOSIS — G894 Chronic pain syndrome: Principal | ICD-10-CM

## 2018-02-21 NOTE — Unmapped (Signed)
Patient is doing ok on the epclusa  He hasn't been able to get around too great with the 'liver condition' but he says he's alright overall    He was concerned about the delivery- assured him I will call with the tracking number tomorrow so he can watch it online and get an idea for when they'll come-however cannot guarantee anything other than it will be there by 8pm    [facility] Refill Coordination Note    Specialty Medication(s) to be Shipped:   Infectious Disease: Eplusa    Other medication(s) to be shipped: n/a     Peter Mejia, DOB: September 23, 1950  Phone: [phone] (home)   Shipping Address: [address]  [place] [address]    All above HIPAA information was verified with patient.     Completed refill call assessment today to schedule patient's medication shipment from the [facility] [phone]).       Specialty medication(s) and dose(s) confirmed: Regimen is correct and unchanged.   Changes to medications: Dimas Aguas reports no changes reported at this time.  Changes to insurance: No  Questions for the pharmacist: No    The patient will receive a drug information handout for each medication shipped and additional FDA Medication Guides as required.      DISEASE/MEDICATION-SPECIFIC INFORMATION        For Hepatitis C patients:  Treatment start date: 8/14  Current treatment week: 2    ADHERENCE     patient hasn't missed any doses       SHIPPING     Shipping address confirmed in Epic.     Delivery Scheduled: Yes, Expected medication delivery date: 9/4 via UPS or courier.     Renette Butters   [facility] Specialty Technician

## 2018-02-21 NOTE — Unmapped (Signed)
Left msg for pt to call back and schedule.

## 2018-02-22 MED FILL — EPCLUSA 400 MG-100 MG TABLET: 28 days supply | Qty: 28 | Fill #1 | Status: AC

## 2018-02-22 MED FILL — EPCLUSA 400 MG-100 MG TABLET: ORAL | 28 days supply | Qty: 28 | Fill #1

## 2018-02-24 ENCOUNTER — Encounter
Admit: 2018-02-24 | Discharge: 2018-02-25 | Payer: MEDICARE | Attending: Neurological Surgery | Primary: Neurological Surgery

## 2018-02-24 DIAGNOSIS — M542 Cervicalgia: Principal | ICD-10-CM

## 2018-02-24 DIAGNOSIS — R531 Weakness: Secondary | ICD-10-CM

## 2018-02-24 NOTE — Unmapped (Signed)
NEUROSURGERY CLINIC NOTE  NEW PATIENT    Assessment and Recommendations  Peter Mejia is a 67 y.o. male who presents to clinic for evaluation of neck pain.  Over the past 9 months, the patient has had a neurologic decline with aggressive myalgias, joint pain, muscular weakness.  These symptoms are diffuse involving both upper and lower extremities.  He has been evaluated by a rheumatologist at the [facility] clinic who had referred him to neurology and pain medicine for further treatment and evaluation.  In addition imaging of his entire spine was obtained which showed multilevel degenerative disease with a moderate amount of central canal stenosis in his cervical spine without obvious cord compression or signal change that could explain his neurologic decline.  On neurologic exam he has diffuse muscle weakness and takes an incredible amount of effort to provide strength in his extremities.  In addition he has long track signs with hyperreflexia and bilateral clonus.  We think it is possible that he has ALS and have placed an urgent referral to [facility] neurology for evaluation.  In addition he has a neurology appointment at the month with an outside neurologist.  We have also ordered MRI brain and EMG/nerve conduction studies to help complete his work-up.  No neurosurgical intervention indicated in his case at this time.    Encounter diagnoses   Diagnosis ICD-10-CM Associated Orders   1. Neck pain M54.2 Ambulatory referral to Neurosurgery       History of present illness  Peter Mejia is a 67 y.o. male whom we are seeing today in neurosurgical clinic at the request of Raynaldo Opitz* for evaluation of neck pain and diffuse weakness.  The patient reports that over the past 9 months to 1 year he has had worsening diffuse joint pain as well as muscle pains and weakness.  He reports that he currently has difficulty with his activities of daily living and takes him 2 hours to shower.  He has been seen by a rheumatologist and orthopedist at an outside facility and has been referred to a neurologist.  Upon being seen in [facility] pain management clinic, he was found to have long track signs including bilateral clonus and MRIs of the thoracic and lumbar spines were obtained.  He was referred to [facility] neurosurgery for evaluation of his cervical stenosis in the setting of neck pain and diffuse weakness.  Of note, the patient has hepatitis C and was started on treatment with Epclusa in the recent past.  He is followed by Dr. Annitta Needs at [facility] for this treatment.    Review of Systems  A 10-system review of systems was conducted and was negative except as documented above in the HPI.    Physical Exam    Vital Signs:   BP 101/66  - Pulse 83 Comment: 8 - Temp 37 ??C  - Resp 16  - Ht 177.8 cm (5' 10)  - Wt 78.9 kg (174 lb)  - BMI 24.97 kg/m??   General: No acute distress; Body mass index is 24.97 kg/m??.    Neurological Exam:  Eyes open spontaneously  Oriented to name, location, time  Pupils equal and reactive bilaterally  Extraocular movements intact bilaterally  Face symmetric  Tongue protrudes in the midline with possible subtle fasciculations  Full shrug bilaterally  RUE: 4+/5 deltoid, 4+/5 bicep, 4/5 tricep, 3/5 wrist extension, 3/5 wrist flexion, 4-/5 hand grip, 3/5 hand intrinsics  LUE: 4+/5 deltoid, 4+/5 bicep, 4-/5 tricep, 3/5 wrist extension, 3/5 wrist flexion, 3/5 hand  grip, 3/5 hand intrinsics  RLE: 4+/5 hip flexion, 4/5 knee extension, 4/5 dorsiflexion, 4/5 plantarflexion, 4/5 EHL  LLE: 4+/5 hip flexion, 4+/5 knee extension, 4/5 dorsiflexion, 4/5 plantarflexion, 4/5 EHL  Absent Hoffman sign bilaterally  Positive ankle clonus on both side/s  Hyperreflexic in bilateral lower extremities and right upper extremity    Cardiovascular: Warm and well perfused with good pulses, no edema  Pulmonary: Unlabored breathing, no pursed lips or wheezing, acyanotic  Abdomen: Soft, nontender, nondistended  Skin: No petechiae, rashes or ecchymoses Neurological imaging reviewed  MRI cervical spine: Multilevel degenerative disease with posterior augmented hypertrophy from C5-C6 causing moderate canal stenosis  MRI thoracic and lumbar spines: Multilevel degenerative disease with mild scoliotic deformity.  No obvious central canal stenosis with minimal multilevel lumbar neuroforaminal stenosis.    ---Historical data---      Allergies  Patient has no known allergies.    Medications    Medications reviewed in Epic.    Past Medical History  Past Medical History:   Diagnosis Date   ??? Back pain    ??? HCV (hepatitis C virus)    ??? Osteoarthritis        Past Surgical History  No past surgical history on file.    Family History  Family History   Problem Relation Age of Onset   ??? Lymphoma Mother    ??? Heart attack Father        Social History  Social History     Tobacco Use   ??? Smoking status: Never Smoker   ??? Smokeless tobacco: Never Used   Substance Use Topics   ??? Alcohol use: Never     Frequency: Never   ??? Drug use: Not on file

## 2018-02-24 NOTE — Unmapped (Signed)
You were seen today in neurosurgery clinic for neck pain.  We will plan to order EMG/nerve conduction studies and refer you to a neurologist.    _____________________________    Thank you for coming to see me today.  The best way to contact me is to establish your [facility] patient portal.  This is called [facility].  The information on how to set up an account is on your after visit summary.    Communication to the patient portal comes directly to Dr. Samuella Cota or his team, and is attached to your medical record.  The communications are secure, and designed to protect the privacy of your information.  We will ask that you use this for communication of important medical information between you and your medical team.  Obviously if you have an urgent or emergent issue, it is important to call or contact us immediately rather than relying on patient portal communications. The phone number for Dr. Kandice Hams nurse, Peyton Bottoms, is [phone].    Sampson Goon, MD  Department of Neurosurgery  [facility]

## 2018-02-27 ENCOUNTER — Ambulatory Visit: Admit: 2018-02-27 | Discharge: 2018-02-28 | Payer: MEDICARE

## 2018-02-27 DIAGNOSIS — R531 Weakness: Principal | ICD-10-CM

## 2018-02-27 NOTE — Unmapped (Signed)
Please place a new referral for pain psychology. Patient stated, I didn't think Dr. Fayrene Fearing needed me to see pain psychology. Patient declined appointment when contacted by M. Deaver.  I explained to patient, that Dr. Fayrene Fearing would not be able to prescribe pain medication without this appointment. Patient verbalized understanding.

## 2018-02-27 NOTE — Unmapped (Signed)
Patient called and left a message that his condition is deteriorating severely and rapidly and he feels it is due to [place]. He cancelled an appointment with Owens Shark, DNP, NP-C on Tuesday and wants to reschedule it.   Patient appointment is still on the schedule.  He is requesting a call back from Dr. Romeo Apple.  WIll forward message to her and to Park Breed PharmD.

## 2018-02-27 NOTE — Unmapped (Signed)
Follow up for HCV treatment:    Regimen: Epclusa x 12 wks  Start date: 02/01/18    Patient called to report he had an appointment with Neurosurgery last Friday, 02/24/18 for symptoms which have been progressively getting worse since last December (not related to [facility]) and was told other than bit of stenosis in cervical spine, findings were non-worrisome. However, he reports Neurosurgeon made an urgent referral to Neurology for further work up and wants to know if he can reschedule his appointment with our clinic tomorrow to another day as Neurology had appt available tomorrow. Advised we can reschedule to 03/01/18 if he needs to see Neurology tomorrow. Pt called back later to report he will keep his appointment with our clinic as scheduled.     Park Breed, Pharm D., BCPS, BCGP, CPP  [facility]  [address]  [place], [place] [address]  [phone]

## 2018-02-28 ENCOUNTER — Encounter: Admit: 2018-02-28 | Discharge: 2018-03-01 | Payer: MEDICARE

## 2018-02-28 ENCOUNTER — Encounter: Admit: 2018-02-28 | Discharge: 2018-03-01 | Payer: MEDICARE | Attending: Family | Primary: Family

## 2018-02-28 DIAGNOSIS — B182 Chronic viral hepatitis C: Principal | ICD-10-CM

## 2018-02-28 DIAGNOSIS — K7460 Unspecified cirrhosis of liver: Secondary | ICD-10-CM

## 2018-02-28 LAB — CBC W/ AUTO DIFF
BASOPHILS ABSOLUTE COUNT: 0.1 10*9/L (ref 0.0–0.1)
BASOPHILS RELATIVE PERCENT: 0.8 %
EOSINOPHILS ABSOLUTE COUNT: 0.1 10*9/L (ref 0.0–0.4)
EOSINOPHILS RELATIVE PERCENT: 1 %
HEMATOCRIT: 42 % (ref 41.0–53.0)
LARGE UNSTAINED CELLS: 1 % (ref 0–4)
LYMPHOCYTES ABSOLUTE COUNT: 1.5 10*9/L (ref 1.5–5.0)
LYMPHOCYTES RELATIVE PERCENT: 20.3 %
MEAN CORPUSCULAR HEMOGLOBIN CONC: 33.3 g/dL (ref 31.0–37.0)
MEAN CORPUSCULAR HEMOGLOBIN: 28.3 pg (ref 26.0–34.0)
MEAN CORPUSCULAR VOLUME: 85 fL (ref 80.0–100.0)
MONOCYTES ABSOLUTE COUNT: 0.4 10*9/L (ref 0.2–0.8)
MONOCYTES RELATIVE PERCENT: 5.5 %
NEUTROPHILS ABSOLUTE COUNT: 5.3 10*9/L (ref 2.0–7.5)
NEUTROPHILS RELATIVE PERCENT: 71.1 %
PLATELET COUNT: 179 10*9/L (ref 150–440)
RED BLOOD CELL COUNT: 4.93 10*12/L (ref 4.50–5.90)
RED CELL DISTRIBUTION WIDTH: 13.2 % (ref 12.0–15.0)
WBC ADJUSTED: 7.5 10*9/L (ref 4.5–11.0)

## 2018-02-28 LAB — HEPATIC FUNCTION PANEL
ALKALINE PHOSPHATASE: 92 U/L (ref 38–126)
ALT (SGPT): 13 U/L (ref 13–69)
BILIRUBIN DIRECT: 0.1 mg/dL (ref 0.00–0.40)
PROTEIN TOTAL: 7.7 g/dL (ref 6.5–8.3)

## 2018-02-28 LAB — BASIC METABOLIC PANEL
ANION GAP: 9 mmol/L (ref 9–15)
BLOOD UREA NITROGEN: 12 mg/dL (ref 7–21)
BUN / CREAT RATIO: 16
CHLORIDE: 103 mmol/L (ref 98–107)
CO2: 25 mmol/L (ref 22.0–30.0)
CREATININE: 0.77 mg/dL (ref 0.70–1.30)
EGFR CKD-EPI AA MALE: >90 mL/min/{1.73_m2} (ref >=60)
EGFR CKD-EPI NON-AA MALE: >90 mL/min/{1.73_m2} (ref >=60)
GLUCOSE RANDOM: 104 mg/dL (ref 65–179)
POTASSIUM: 4.2 mmol/L (ref 3.5–5.0)

## 2018-02-28 LAB — PROTIME-INR: INR: 1.15

## 2018-02-28 LAB — PROTIME: Lab: 13.3 — ABNORMAL HIGH

## 2018-02-28 LAB — ALBUMIN: Albumin:MCnc:Pt:Ser/Plas:Qn:: 4.2

## 2018-02-28 LAB — CHLORIDE: Chloride:SCnc:Pt:Ser/Plas:Qn:: 103

## 2018-02-28 LAB — MEAN CORPUSCULAR HEMOGLOBIN CONC: Lab: 33.3

## 2018-02-28 NOTE — Unmapped (Signed)
[facility]    Alba Destine, M.D.  Professor of Medicine  Director, [facility]  [facility]    [phone]    Marton Redwood, MD  [address]  [place], [place] [address]     Chief complaint: Treatment follow up HCV, genotype 1a. Prior treatment exposure  Epclusa x 12 weeks  Start Date: 02/01/2018  TW #4 D #7    Present illness: Patient is a 67 y.o. Caucasian male with chronic hepatitis C for at least 30 years. He was found to have an abnormal ALT in the 1970s.  Interestingly, he was treated at the [facility] and was evaluated by Dr. Sharon Mt when he was there during his hepatology fellowship between [phone].  At the [facility] by his description, he was treated with interferon monotherapy which she did not tolerate well and then he was treated with ribavirin monotherapy.    He presents alone today. Prior treatment exposure patient. He has adhered to taking Epclusa one tablet daily ~ 2 PM. Denies having missed any doses. No alcohol use or anti acid medications during treatment. He does not feel he has experienced any potential adverse events with DAA. He presents today with several complaints. He states he was normal state of health up until sometime in December when he had a marked change in his clinical status. He developed extensive fatigue, mild right upper quadrant discomfort, generalized pain manifested by myalgias and arthralgias, as well as generalized muscle weakness. He was seen by Dr. Gavin Potters at [facility] and evaluation was inconclusive. Seen by Dr. Dayna Barker and again inconclusive evaluation. RF <8.6 with negative cryoglobulin.     He denies nausea, vomiting, abdominal pain, chest pain, or shortness of breath. Complaint of being very weak today and requesting wheelchair to get from waiting room to examination room. Requesting something for pain. Advises he is going to run out of Tramadol. He has been seen by pain management clinic at [facility] x 1 time.  Their plan is inclusive of him seeing pain psychologist. He has declined seeing her. He advises, no one will give me something else for pain. He advises he is experiencing severe pain all over at times. He is unable to walk at times. In review of his dietary intake. Very poor caloric intake. Skipping meals frequently.      ROS: All systems reviewed and negative except in HPI.     Liver Section:  1. Chronic hepatitis C, treatment naive, genotype 1A  Baseline HCV RNA: 891,000 IU/ml on 09/05/17   HCV RNA TW #5 - detected less than 12 IU/mL    2. + immunity hepatitis A.   3.  Immunity hepatitis B:   4.  F3-4 (12.3 kPa) on Fibroscan on 01/18/18  5.  HCC screening: MRI of abdomen 01/24/2018 - Cirrhotic morphology of the liver with sequela of portal hypertension including splenomegaly and upper abdominal varices.  6. Variceal screening: EGD ordered. Pending examination result.   7.  OLT: Not indicated at this time. Low MELD score. ? Ideal candidate. Concern existing with psych well being.   8.  MELD-Na score: 8 at 02/28/2018  4:27 PM  MELD score: 8 at 02/28/2018  4:27 PM  Calculated from:  Serum Creatinine: 0.77 mg/dL (Rounded to 1 mg/dL) at 01/13/3663  4:03 PM  Serum Sodium: 137 mmol/L at 02/28/2018  4:27 PM  Total Bilirubin: 0.9 mg/dL (Rounded to 1 mg/dL) at  02/28/2018  4:27 PM  INR(ratio): 1.15 at 02/28/2018  4:27 PM  Age: 56 years    Past medical history:    Active Ambulatory Problems     Diagnosis Date Noted   ??? Chronic pain syndrome 02/15/2018   ??? Neck pain 02/15/2018   ??? Cervical spinal stenosis 02/15/2018     Resolved Ambulatory Problems     Diagnosis Date Noted   ??? No Resolved Ambulatory Problems     Past Medical History:   Diagnosis Date   ??? Back pain    ??? HCV (hepatitis C virus)    ??? Hepatitis C    ??? Osteoarthritis    Resolved depression  No history of diabetes, coronary artery disease, lung disease, or hypertension.     Surgical History:  Past Surgical History:   Procedure Laterality Date   ??? KNEE ARTHROSCOPY Left 1982    meniscus   ??? TONSILLECTOMY       Family History:  Family History   Problem Relation Age of Onset   ??? Lymphoma Mother         non hodkins   ??? Heart attack Father    Negative for liver disease or liver cancer.    Social History:   Social History     Socioeconomic History   ??? Marital status: Single     Spouse name: Not on file   ??? Number of children: 0   ??? Years of education: Not on file   ??? Highest education level: Some college, no degree   Occupational History   ??? Not on file   Social Needs   ??? Financial resource strain: Not on file   ??? Food insecurity:     Worry: Not on file     Inability: Not on file   ??? Transportation needs:     Medical: Not on file     Non-medical: Not on file   Tobacco Use   ??? Smoking status: Never Smoker   ??? Smokeless tobacco: Never Used   Substance and Sexual Activity   ??? Alcohol use: Never     Frequency: Never   ??? Drug use: Not Currently     Types: Cocaine   ??? Sexual activity: Not Currently     Partners: Female   Lifestyle   ??? Physical activity:     Days per week: Not on file     Minutes per session: Not on file   ??? Stress: Not on file   Relationships   ??? Social connections:     Talks on phone: Not on file     Gets together: Not on file     Attends religious service: Not on file     Active member of club or organization: Not on file     Attends meetings of clubs or organizations: Not on file     Relationship status: Not on file   Other Topics Concern   ??? Exercise Not Asked   ??? Living Situation Not Asked   Social History Narrative   ??? Not on file   The patient is not married.  He does not have any children.  He formerly did some work from [employer] but stopped last year when he started feeling poorly.     No Known Allergies    Current Outpatient Medications   Medication Sig Dispense Refill   ??? acetaminophen (TYLENOL) 100 mg/mL suspension Take 1,000 mg by mouth.     ??? EPCLUSA 400-100 mg tablet  TAKE 1 TABLET BY MOUTH ONCE DAILY 28 each 2   ??? traMADol (ULTRAM) 50 mg tablet Take 50 mg by mouth Four (4) times a day.      ??? NAPROXEN, BULK, MISC 400 mg.       No current facility-administered medications for this visit.      Physical Examination:    BP 108/72  - Pulse 70  - Temp 36.8 ??C (98.2 ??F) (Oral)  - Resp 18  - Ht 177.8 cm (5' 10)  - Wt 74.4 kg (164 lb 1.6 oz)  - SpO2 98%  - BMI 23.55 kg/m??     General: Pleasant individual in NAD. WD. WN. Moaning with pain at times. Weak appearance but laughing and manic like behavior.   Neuro: No gross neurological deficits.   Rest of PE deferred.     Laboratory Results:   Results for orders placed or performed in visit on 02/28/18   Basic metabolic panel   Result Value Ref Range    Sodium 137 135 - 145 mmol/L    Potassium 4.2 3.5 - 5.0 mmol/L    Chloride 103 98 - 107 mmol/L    CO2 25.0 22.0 - 30.0 mmol/L    Anion Gap 9 9 - 15 mmol/L    BUN 12 7 - 21 mg/dL    Creatinine 8.11 9.14 - 1.30 mg/dL    BUN/Creatinine Ratio 16     EGFR CKD-EPI Non-African American, Male >90 >=60 mL/min/1.67m2    EGFR CKD-EPI African American, Male >90 >=60 mL/min/1.65m2    Glucose 104 65 - 179 mg/dL    Calcium 9.6 8.5 - 78.2 mg/dL   Hepatic Function Panel   Result Value Ref Range    Albumin 4.2 3.5 - 5.0 g/dL    Total Protein 7.7 6.5 - 8.3 g/dL    Total Bilirubin 0.9 0.0 - 1.2 mg/dL    Bilirubin, Direct 9.56 0.00 - 0.40 mg/dL    AST 25 17 - 47 U/L    ALT 13 13 - 69 U/L    Alkaline Phosphatase 92 38 - 126 U/L   PT-INR   Result Value Ref Range    PT 13.3 (H) 10.2 - 13.1 sec    INR 1.15    Hepatitis C RNA, Quantitative, PCR   Result Value Ref Range    HCV RNA Detected     HCV RNA (IU) <12 (H) <=0 IU/mL    HCV RNA Log(10)      HCV RNA Comment       Hepatitis C virus RNA quantification is performed using the FDA-approved Abbott RealTime PCR HCV test, targeting the 5' UTR. This test can quantify HCV RNA over the range of 12-100,000,000 IU/mL (1.08 log(10) - 8.0 log(10) IU/mL). Both the limit of detection and limit of quantification are 12 IU/mL. The reference range for this assay is Not Detected.   CBC w/ Differential   Result Value Ref Range    WBC 7.5 4.5 - 11.0 10*9/L    RBC 4.93 4.50 - 5.90 10*12/L    HGB 14.0 13.5 - 17.5 g/dL    HCT 21.3 08.6 - 57.8 %    MCV 85.0 80.0 - 100.0 fL    MCH 28.3 26.0 - 34.0 pg    MCHC 33.3 31.0 - 37.0 g/dL    RDW 46.9 62.9 - 52.8 %    MPV 7.9 7.0 - 10.0 fL    Platelet 179 150 - 440 10*9/L    Neutrophils % 71.1 %  Lymphocytes % 20.3 %    Monocytes % 5.5 %    Eosinophils % 1.0 %    Basophils % 0.8 %    Absolute Neutrophils 5.3 2.0 - 7.5 10*9/L    Absolute Lymphocytes 1.5 1.5 - 5.0 10*9/L    Absolute Monocytes 0.4 0.2 - 0.8 10*9/L    Absolute Eosinophils 0.1 0.0 - 0.4 10*9/L    Absolute Basophils 0.1 0.0 - 0.1 10*9/L    Large Unstained Cells 1 0 - 4 %     Assessment/Plan:  1.  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 Prior treatment exposure. He presents today for treatment follow up ~ TW #4. He has adhered to taking Epclusa one tablet daily. Denies having missed any doses. No alcohol use or anti-acid medications during treatment. Overall, he has tolerated treatment well. He was symptomatic with myalgias and chronic pain prior to initiation of treatment. He does not feel his symptoms have worsen with DAA treatment. FibroScan result 12.3 kPa consistent with stage F4 borderline cirrhosis.      ~ HCV safety labs ordered.   ~ Office follow up nine weeks for EOT   ~ Continue Epclusa one tablet daily. Avoid missing any doses.   ~ No alcohol or anti acid use.   ~ Increase water intake  ~ Exchange toothbrushes and razors regular basis.     2. 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 We discussed the naturally history of cirrhosis including increased risk of liver failure, hepatic decompensation, liver cancer and need for liver transplantation. At this point, patient has a relatively low MELD and is not in need of liver transplant evaluation.     3. Variceal screening: EGD ordered. Pending performance and result.     4.  HCC screening: Up to date. It has been discussed with patient of the importance of hepatic imaging performed approximately every 6 months to screen for the development of HCC. Warrants surveillance 07/2018.     4. Alcohol assessment: No alcohol use. AUDIT score = zero.     5. Nutritional support: Instructed patient to increase lean protein in your diet. Recommend 30 grams of lean protein with each meal and high protein snack at bedtime. Patient education given on high protein food choices.     *Following office visit, after telephone communication with patient, we will proceed with referral to our nutritional services to assist patient. Patient in agreement to proceed forward in this manner.     6. Chronic pain syndrome: Multilevel degenerative changes in the lumbar spine and thoracic spine per MRI findings. Pending evaluation with neurology. Under the care of pain management clinic at [facility]. He has been instructed to see pain psychologist, Harland German, MD. He has not been taking Tramadol as prescribed. Taking qid as opposed to tid along with 2,000 mg Tylenol daily. As stated above he requested medication refill. Instructed patient I am legally unable to fulfill his request with him being under the care of [facility] pain management clinic. Patient voiced understanding, but not pleased with my response.     7. Positive immunity hepatitis B.     8. Immunity status hepatitis A: IgG test ordered.     All patient's questions were answered to his satisfaction during office visit today. Patient was seen by Park Breed, our clinical pharmacist per patient request to  update her of my status.    Rodman Key, DNP, FNP-BC  [facility]  [address]  [place] [address]  Phone [phone] Office visit ~ 60 minutes in length given extensive questions as well as numerous complaints. Discussed patient's office visit with Dr. Sharon Mt.

## 2018-02-28 NOTE — Unmapped (Addendum)
1. Laboratory studies done today as part of continued hepatitis C and cirrhosis care.   2. Office follow up nine weeks for end of treatment follow up.   3. Continue taking Epclusa one tablet daily. Avoid missing any doses.   4. Increase lean protein in your diet. Recommend 30 grams of lean protein with each meal and high protein snack at bedtime. See patient information below.   5.  Exhange toothbrushes, razor blades as we discussed.   6.  Remain well hydrated.   7.  Okay to take Tylenol up to 2,000 mg total daily as needed for pain.   8.  Any questions please call Sherin Marciano Sequin at [phone]. She will know how to reach me.   9.  Follow up with pain management as scheduled.   10.  Remain scheduled for neurology appointment.    Patient Education     Learning About Getting More Protein  How does your body use protein?    Protein is an essential part of our diets. It is made up of chemicals called amino acids. Your body needs protein to help build and repair muscle, skin, and other body tissues. Protein also helps fight infection, balance body fluids, and carry oxygen through the body.  How much protein do you need?  How much protein you need each day depends on your age, sex, and how active you are. It's recommended that most adults eat 5 to 7 ounces of protein foods a day. Sometimes you may need to eat more protein. Your doctor may advise you on the right amount of protein you need.  What foods contain protein?  Protein is found in a variety of foods. High-protein foods include lean meat, poultry, and fish. A serving of these foods is 2 to 3 ounces. (3 ounces is about the size and thickness of a deck of cards.)  Protein isn't just found in meat. If you are looking for other types of protein, the following foods are equal to about 1 ounce of meat:  ?? ?? cup of cooked beans, peas, or lentils  ?? ?? cup of tofu (about 2 ounces)  ?? 2 Tbsp of hummus  ?? ?? ounce of nuts or seeds (for example, 12 almonds or 7 walnut halves) ?? 1 egg  ?? 1 Tbsp of peanut butter or other nut or seed butter  Other sources of protein include cheese, milk, and other milk products. You can also buy protein bars, drinks, and powders. Check the nutrition label for the amount of protein in each serving.  What are some tips for getting more protein?  You can get more protein in your food by adding high-protein ingredients. For example, you can:  ?? Add powdered milk to other foods, such as pudding or soups.  ?? Add powdered protein to fruit smoothies and cooked cereal.  ?? Add beans to soup and chili.  ?? Add nuts, seeds, or wheat germ to yogurt.  You can also:  ?? Spread peanut butter onto a banana.  ?? Mix cottage cheese into noodle dishes or casseroles.  ?? Sprinkle hard-boiled eggs on a salad.  ?? Grate cheese over vegetables and soups.  Where can you learn more?  Go to [facility] at https://carlson-fletcher.info/.  [facility] under the Resources menu. Enter 404-056-3840 in the search box to learn more about Learning About Getting More Protein.  Current as of: April 27, 2017  Content Version: 12.2  ?? 2006-2019 [employer]. Care instructions adapted under license by  [facility]. If you have questions about a medical condition or this instruction, always ask your healthcare professional. [employer], Inc[employer] disclaims any warranty or liability for your use of this information.

## 2018-02-28 NOTE — Unmapped (Signed)
I placed an order for pain psychology for COM eval as requested

## 2018-02-28 NOTE — Unmapped (Signed)
Patient refused pain psychology aptms

## 2018-02-28 NOTE — Unmapped (Signed)
Patient left message on nurse line, stating I want to talk directly with Dr. Cheri Guppy anyone else in the office, not even nurse Ranata Laughery. I need clarification about our discussion during my clinic visit. (FYI-Patient declined pain psychologist appointment when Emelda Fear Deaver attempted to schedule. Patient insisted when speaking with me that Dr. Fayrene Fearing said I do not need to see pain psychology. I need her to take over witting my Rx's asap. I am running out of medication. I checked with Renee. We noted in plan patient was to see pain psychology, prior to next visit. I had new referral placed Harland German, MD r/t original being canceled upon patient declining appointment. Patient was instructed to call and schedule next appointment with Dr. Fayrene Fearing and pain psychologist once referral was placed. Patient voiced his displeasure when he was told we were not able to prescribe his pain medications, stating No one informed me of that at my initial visit.

## 2018-03-01 NOTE — Unmapped (Signed)
Spoke with patient. He advises he has questions about proper nutritional intake for himself in the setting of having cirrhosis and noted weight loss and myalgias. Referral placed for nutritional services at [facility].

## 2018-03-01 NOTE — Unmapped (Signed)
Attempted to reach patient. Left message on voice and telephone identified machine. Request patient to call me back at telephone number he was given yesterday and/or I will attempt to reach him later this afternoon.

## 2018-03-02 LAB — HCV RNA(IU): Hepatitis C virus RNA:ACnc:Pt:Ser/Plas:Qn:Probe.amp.tar: <12 — ABNORMAL HIGH

## 2018-03-02 LAB — HEPATITIS C RNA, QUANTITATIVE, PCR

## 2018-03-03 NOTE — Unmapped (Signed)
Patient left message on nurse line, stating he missed a call from Dr. Fayrene Fearing and would like to speak with her today if possible.

## 2018-03-03 NOTE — Unmapped (Signed)
Spoke with Mr Peter Mejia about his good results: liver enzymes are normalized and HCV level is < 12, essentially not detected. Patient verbalizes understands. States feels lack of energy. We discussed the importance of the protein intact and the various choices available. Told him he was doing a great job with the epclusa and to keep taking it each day. He replied he takes it at 2pm every day.

## 2018-03-07 ENCOUNTER — Encounter: Admit: 2018-03-07 | Discharge: 2018-03-08 | Payer: MEDICARE

## 2018-03-07 ENCOUNTER — Ambulatory Visit: Admit: 2018-03-07 | Discharge: 2018-03-08 | Payer: MEDICARE

## 2018-03-07 ENCOUNTER — Encounter
Admit: 2018-03-07 | Discharge: 2018-03-08 | Payer: MEDICARE | Attending: Student in an Organized Health Care Education/Training Program | Primary: Student in an Organized Health Care Education/Training Program

## 2018-03-07 DIAGNOSIS — R531 Weakness: Principal | ICD-10-CM

## 2018-03-07 DIAGNOSIS — B182 Chronic viral hepatitis C: Secondary | ICD-10-CM

## 2018-03-07 DIAGNOSIS — R634 Abnormal weight loss: Secondary | ICD-10-CM

## 2018-03-07 DIAGNOSIS — G1221 Amyotrophic lateral sclerosis: Principal | ICD-10-CM

## 2018-03-07 DIAGNOSIS — K7460 Unspecified cirrhosis of liver: Secondary | ICD-10-CM

## 2018-03-07 DIAGNOSIS — G959 Disease of spinal cord, unspecified: Secondary | ICD-10-CM

## 2018-03-07 LAB — VITAMIN B-12: Cobalamins:MCnc:Pt:Ser/Plas:Qn:: 839

## 2018-03-07 NOTE — Unmapped (Signed)
pt. returned my phone call. pt requested I send a message to Dr. Foy Guadalajara with specific questions relating to his not feeling well, if he needs a colonoscopy, and a referral to oncology due to his concerns of having a hidden cancer.   Msg. sent per pt. request.

## 2018-03-07 NOTE — Unmapped (Signed)
[facility]  [facility] [place]  [address]  [place] [address]  [phone]  Division of Neuromuscular disorders  Hilbert Bible, MD  Clinical Assistant Professor    Date: March 07, 2018  Patient Name: Peter Mejia  MRN: [id number]  PCP: Creola Corn, MD    [name] is a 67 y.o. Caucasian right handed male seen in consultation at the [facility] at the request of Dr. Samuella Cota for evaluation of muscle weakness and a concern for MND.  The following is the information obtained from the patient, , from his electronic medical records from [facility].    Assessment:      [name] is a 67 y.o. male who  has a past medical history of Back pain, HCV (hepatitis C virus), Hepatitis C, and Osteoarthritis. presenting in consultation for evaluation of MND.     The patient's clinical presentation is not consistent with MND, such as ALS. He has significant pain syndrome: pain in his joints and his muscles, as well as some cognitive issues ( he is here unaccompanied, so I am not sure if this is his baseline), he has difficulties to follow up even simple commands, he has to repeat the instructions be fore he follows commands. He also reports that he has to think about the movements to make his arm or leg move. He has asymmetric reflexes, R>L and ankle clonus on the right.   The DDx should include autoimmune/inflammaotry myelopathy:     Plan:      Blood work:  - ANA/ENA, Immunofixation, cryoglobulin, B12, Copper, Vit E, HIV, hepatitis B, paraneoplastic panel  - CSF: basic: cell count, protein and glucose; CMV, EBV, HHV-6, oligoclonal bands  - MRI cervical and thoracic spine with contrast: he has numerous lesions on his MRI of the spine in vertebral bodies, which has not been commented on the read. I have contacted the radiology today and have discussed his images, they have recommended to obtain the MRI with contrast.   - I'll see him back in 2 months to review all these test results.       More than 50 % of today's 90 minute visit was dedicated to face-to-face counseling on test results, diagnosis, treatment and prognosis.    Subjective:      Main complaints: pain muscle and joint/bone    HPI:  Initial symptoms started with arthritis in his knees in November- December 2018. 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 In December 2018 he started feeling lethargic: he didn't have energy and some pain in his liver area. He has been diagnosed with Hep C in 1978.  Then he started feeling that he is starting loosing muscle bulk and since March 2019 it became very difficult to walk, take stares, lifting his right arm.     [facility]              MYOPATHY QUESTIONNAIRE                PLEASE ANSWER QUESTIONS AS THEY PERTAIN TO THE LAST 1 WEEK     FUNCTION     GRADE     SCORE     no changes compare to baseline mild dysfunction, 1-30 % less compare to baseline  moderate dysfunction, 30-60%  less compare to baseline  severe dysfunction, 60-90%  less compare to baseline  advanced stage- unable  0 1 2 3 4      Chewing  x             Swallowing, some, no episodes of chocking, but  He needs water sometimes to move the food     x           Holding head up  x             Raising arms up: combing hair, placing objects on the shelves: he has to think how to lift his arms up, he also has weakness and pain in his muscles and jopints        x       Lifting heavy objects, he can hold gallon of milk now, but has to assist with the other hand, used to do 50 lbs      x         Carrying heavy objects      x         Hand grip  x             Dexterity: buttoning, writing, picking up small objects  x             Sitting from lying position, mild difficult     x           Standing form sitting position, very difficuolt      x         Maintaining standing position a few minutes      x         Walking, 50 steps x       Running, last time 3-4 years ago               Going up stairs, don't know               Muscle pain, 10/10        x       Breathing while lying down/ sitting/ with physical activities  x                       Total score:     Other               Weight loss, over 10 lbs in 6 montsh      x         Skin changes               Joint pain      x         Swelling    x             Muscle pain: with movements, 10/10, at rest he has no pain    Bowel: he has BM 2-3 days, thsi was daily before  bludder: frequent urination: since April 2019, both, during the day or at the night  Muscle cramps: no cramps, only big toe extension  Muscle twitching  Muscle waisting   Memory: nothing severe  Behavioral changes: more anxiety  Lives alone  Numbness and tingling: sometimes in his right foot    I have personally reviewed his images:  MRIs: brain, C-spine, T-spine and L spine , personally reviewed:  - he has multilevel DDD with moderate spinal stenosis, no signal changes in the spine. He has numerous changes in the vertebrae, this was not commented on the read.     CK normal, cryoglobulin normal, AST and ALT elevated 97/74 from 7/19  and normal on 02/28/18, hepatitis B Abs/Ag- normal, TSH 1.634, positive Hep C, normal RF      Past Medical Hx:  Past Medical History:   Diagnosis Date   ??? Back pain    ??? HCV (hepatitis C virus)    ??? Hepatitis C    ??? Osteoarthritis        Past Surgical Hx:  Past Surgical History:   Procedure Laterality Date   ??? KNEE ARTHROSCOPY Left 1982    meniscus   ??? TONSILLECTOMY         Social Hx:  Social History     Socioeconomic History   ??? Marital status: Single     Spouse name: None   ??? Number of children: 0   ??? Years of education: None   ??? Highest education level: Some college, no degree   Occupational History   ??? None   Social Needs   ??? Financial resource strain: None   ??? Food insecurity:     Worry: None     Inability: None   ??? Transportation needs:     Medical: None     Non-medical: None   Tobacco Use ??? Smoking status: Never Smoker   ??? Smokeless tobacco: Never Used   Substance and Sexual Activity   ??? Alcohol use: Never     Frequency: Never   ??? Drug use: Not Currently     Types: Cocaine   ??? Sexual activity: Not Currently     Partners: Female   Lifestyle   ??? Physical activity:     Days per week: None     Minutes per session: None   ??? Stress: None   Relationships   ??? Social connections:     Talks on phone: None     Gets together: None     Attends religious service: None     Active member of club or organization: None     Attends meetings of clubs or organizations: None     Relationship status: None   Other Topics Concern   ??? Exercise Not Asked   ??? Living Situation Not Asked   Social History Narrative   ??? None       Family Hx:  Family History   Problem Relation Age of Onset   ??? Lymphoma Mother         non hodkins   ??? Heart attack Father        ALLERGIES:  No Known Allergies    CURRENT MEDICATIONS:  Prior to Admission medications    Medication Sig Start Date End Date Taking? Authorizing Provider   acetaminophen (TYLENOL) 100 mg/mL suspension Take 1,000 mg by mouth.    Historical Provider, MD   EPCLUSA 400-100 mg tablet TAKE 1 TABLET BY MOUTH ONCE DAILY 01/26/18 01/26/19  Calton Golds, MD   traMADol Janean Sark) 50 mg tablet Take 50 mg by mouth Four (4) times a day.  02/09/18   Historical Provider, MD       Review of Systems:  A 10-systems review was performed and, unless otherwise noted, declared negative by patient.     Objective:     Vitals:    03/07/18 1109   BP: 112/83   BP Site: L Arm   BP Position: Sitting   BP Cuff Size: Medium   Pulse: 74   Resp: 17   Temp: 36.5 ??C (97.7 ??F)   TempSrc: Temporal   SpO2: 98%  Physical Exam:  GENERAL:NAD, awake, alert, well nourished   Eyes: no scleral injection, no tearing  ENT: Moist mucous membranes of the oral cavity.   Neck: Supple, no carotid bruits   Cardiovascular: RRR, w/o any murmurs, rub, or gallop.   Lungs: CTAB, no wheezes/crackles/rhonchi.   Skin: No rash, lesions, breakdown   Psychiatry: Mood and affect appropriate   Abdomen: soft, non tender, non distended,   Extremeties: No cyanosis, clubbing or edema.     Neurological Examination:   Mental Status:   alert, oriented to person, place and time. Speech with no  dysarthria, able to name and repeat with no  difficulty.  He has difficulties with following up commands: he has to repeat the command and f/u on it very slowly, but eventually is abl;e to do.     Cranial Nerves:   II, III- Pupls are equal 3 mm and reactive to light b/l, visual fields are intact   III, IV, VI- extraocular movements are intact, No ptosis, no nystagmus.  V- sensation is intact on the face   VII- face symmetrical, no facial droop, normal facial movements with smile/grimace  VIII- Hearing grossly intact.  IX and X- symmetric palate contraction, normal gag bilaterally  XI- Full shoulder shrug bilaterally  XII- Tongue protrudes midline, full range of movements of the tongue    Motor Exam:    Normal bulk and tone, no abnormal spontaneous movements observed in the muscles, such as fasciculations, myokymia.   Patient did not have high arched feet, hammer toes, kyphoscoliosis.    5/5 neck flexion/extension  Eye closure: normal  Buccinator: normal  Jaw strength: normal    Muscle strength in the upper and lower extremities was assessed using MRC score (Right/Left). There was no pain with passive and active movements of the upper and lower extremities.  Arm Abduction (deltoids, supraspinati): 5/5   Arm Adduction: 5/5  Arm Flexion: 5/5    Arm Extension: 5/5   Wrist Extension: 5/5  Wrist Flexion: 5/5   Hand grip: 5/5   Long Finger Flexors: median innervated 5/5, ulnar innervated 5/5  Finger Extensors: 5/5   Interossei: 4/4  Hip Flexion: 4-/4-   Knee Flexion: 5/5  Knee Eextension: 5/5   Foot Dorsiflexion: 5/5   Foot Plantar flexion: 5/5   Big toe Dorsiflexion: 5/5   Big toe Plantar flexion: 5/5       Sensory UEs LEs    R L R L   Temperature WNL WNL WNL WNL Vibration WNL WNL WNL WNL       Reflexes R L   Biceps +2, brisk +2   Triceps +2 +2   Patella +2 +2   Achilles +3 +1     Ankle clonus 2+  Negative Hoffmann's sign b/l     Coordination: FtN without dysmetria or ataxia. Foot tap is normal    Gait: small steps, wide base gait, unsteady in Romberg, unable to tandem and walk on toes and heels       Diagnostic Studies and Review of Records:      Please see in HPI

## 2018-03-07 NOTE — Unmapped (Addendum)
You were seen in the neuromuscular clinic for evaluation of weakness and pian.    Plan:  - [facility] 2[facility]30[facility]    I'll see you back in 2 months in the clinic (different location), someone will call you to schedule for the f/u.

## 2018-03-08 LAB — HEPATITIS A IGG: Hepatitis A virus Ab.IgG:PrThr:Pt:Ser:Ord:: REACTIVE — AB

## 2018-03-08 NOTE — Unmapped (Signed)
Call Center Message, 03/08/2018, 11:27 AM    Renea Ee:    Mr. [name], MR# [id number], called to report to Dr. Lanora Manis that he is scheduled for his cervical and thoracic MRI on 03/14/18 at the [facility] in [place], [place], and an upper GI endoscopy on 04/17/18 at [facility].  He is inquiring if his upper GI can be scheduled in [place].  Please advise.  Phone:  [phone]

## 2018-03-08 NOTE — Unmapped (Signed)
Dr. Fran Lowes see message below.

## 2018-03-09 LAB — IMMUNOFIXATION ELECTROPHORESIS 1: Lab: 0

## 2018-03-09 LAB — SERUM PROTEIN ELECTROPHORESIS AND IMMUNOFIXATION
ALBUMIN (SPE): 4.6 g/dL (ref 3.5–5.0)
ALPHA-1 GLOBULIN: 0.4 g/dL (ref 0.2–0.5)
ALPHA-2 GLOBULIN: 1.2 g/dL — ABNORMAL HIGH (ref 0.5–1.1)
BETA-1 GLOBULIN: 0.4 g/dL (ref 0.3–0.6)
BETA-2 GLOBULIN: 0.4 g/dL (ref 0.2–0.6)
GAMMAGLOBULIN: 1.6 g/dL — ABNORMAL HIGH (ref 0.5–1.5)

## 2018-03-09 LAB — COPPER: Copper:MCnc:Pt:Ser/Plas:Qn:: 1.43

## 2018-03-10 LAB — ANA

## 2018-03-10 LAB — ANA TITER 1: Lab: 1:320 {titer}

## 2018-03-10 NOTE — Unmapped (Signed)
Patient called to inquire about any interaction between his contrast dye, Gadobenate dimeglumine and Epclusa. Advised ok to get his imaging done as his providers deem necessary.

## 2018-03-11 LAB — GAD65 AB, SERUM: Glutamate decarboxylase 65 Ab:SCnc:Pt:Ser:Qn:: 0

## 2018-03-11 LAB — VITAMIN E LEVEL: Alpha tocopherol:MCnc:Pt:Ser/Plas:Qn:: 15

## 2018-03-11 NOTE — Unmapped (Signed)
Addended by: Tommie Raymond on: 03/11/2018 02:32 PM     Modules accepted: Orders

## 2018-03-14 ENCOUNTER — Encounter: Admit: 2018-03-14 | Discharge: 2018-03-15 | Payer: MEDICARE

## 2018-03-14 DIAGNOSIS — G959 Disease of spinal cord, unspecified: Secondary | ICD-10-CM

## 2018-03-14 DIAGNOSIS — R531 Weakness: Principal | ICD-10-CM

## 2018-03-14 LAB — PARANEOPLASTIC ANTIBODY PANEL
ACHR GANGLIONIC NEURONAL ANTIBODY: 0 nmol/L
AGNA-1, SERUM: NEGATIVE {titer}
ANNA TYPE 3, SERUM: NEGATIVE {titer}
ANTINEURONAL NUCLEAR ANTIBODY-TYPE 1: NEGATIVE {titer}
ANTINEURONAL NUCLEAR ANTIBODY-TYPE 2: NEGATIVE {titer}
N-TYPE CA CHANNEL ANTIBODY, SERUM: 0 nmol/L
P/Q-TYPE CA CHANNEL ANTIBODY: 0 nmol/L
PCA TYPE 1, SERUM: NEGATIVE {titer}
PCA TYPE 2, SERUM: NEGATIVE {titer}
PCA TYPE TR SERUM: NEGATIVE {titer}
STRIATED MUSCLE AB: NEGATIVE {titer}
VGKC ANTIBODY, SERUM: 0 nmol/L

## 2018-03-14 LAB — AMPHIPHYSIN ANTIBODY, SERUM: Amphiphysin Ab:Titr:Pt:Ser:Qn:: NEGATIVE

## 2018-03-15 NOTE — Unmapped (Signed)
[facility] Refill Coordination Note  Specialty Medication(s): Epclusa 400-100 mg  Additional Medications shipped: none    [name], DOB: 03-25-1951  Phone: [phone] (home) , Alternate phone contact: N/A  Phone or address changes today?: No  All above HIPAA information was verified with patient.  Shipping Address: [address]  [place] [address]   Insurance changes? No    Completed refill call assessment today to schedule patient's medication shipment from the [facility] [phone]      Confirmed the medication and dosage are correct and have not changed: Yes, regimen is correct and unchanged.    Confirmed patient started or stopped the following medications in the past month:  No, there are no changes reported at this time.    Are you tolerating your medication?:  Dimas Aguas reports tolerating the medication.    ADHERENCE    Did you miss any doses in the past 4 weeks? No missed doses reported.    FINANCIAL/SHIPPING    Delivery Scheduled: Yes, Expected medication delivery date: 03/23/18 via UPS    The patient will receive a drug information handout for each medication shipped and additional FDA Medication Guides as required.      Dimas Aguas did not have any additional questions at this time.    Delivery address validated in Epic.    We will follow up with patient monthly for standard refill processing and delivery.      Thank you,  Verdia Kuba   [facility] Specialty Pharmacist

## 2018-03-20 NOTE — Unmapped (Signed)
Patient calling requesting his MRI results. He would like to know if there is anything that may require his immediate attention. He was informed that Dr. Lanora Manis is not in the office until next week and he stated he would like for someone to give him the results prior to that.     Please let me know if you would like for me to call patient back

## 2018-03-21 LAB — ENA SCREEN: Lab: 0.8 — ABNORMAL HIGH

## 2018-03-22 MED FILL — EPCLUSA 400 MG-100 MG TABLET: ORAL | 28 days supply | Qty: 28 | Fill #0

## 2018-03-22 MED FILL — EPCLUSA 400 MG-100 MG TABLET: 28 days supply | Qty: 28 | Fill #0 | Status: AC

## 2018-03-23 LAB — ENA PANEL
ANTI-JO1 AB: NEGATIVE
ANTI-RNP AB: UNDETERMINED — AB
ANTI-SCL-70 AB: NEGATIVE
ANTI-SSA AB: NEGATIVE
ANTI-SSB AB: UNDETERMINED — AB

## 2018-03-23 LAB — ANTI-RNP AB: Lab: UNDETERMINED — AB

## 2018-03-24 NOTE — Unmapped (Signed)
Has multiple concerns with parking and time for prep  Given phone number for parking issues  Get up early for to finish the prep so that dont need to worry about travel .  Length of conversation 10 minutes

## 2018-03-29 NOTE — Unmapped (Signed)
pt. called with several questions.   He stated he was under the impression he is cured and if he needed to continue taking his Epclusa.  Stressed to pt. to continue taking his medication as directed.  Pt verbalized understanding and will begin his last bottle tomorrow.   pt. expressed difficulty procuring transportation for upcoming GI procedures, and may have his PCP send a referral for procedures to be done at [facility]. Requested pt. notify Endoscopy Dept. if he needs to cancel his procedures at [facility].  pt verbalized understanding with no further questions.

## 2018-04-12 NOTE — Unmapped (Signed)
Patient called to clarify the necessity of having endoscopic procedures done at [facility]. Informed him that he may have procedures done at any facility - results just need to be sent here.  Referral should be made by PCP. Faxed request to PCP office.  Phone x 25 minutes.

## 2018-04-13 NOTE — Unmapped (Signed)
Follow-Up Counseling for HCV Treatment      Regimen: Epclusa (sofosbuvir/velpatasvir 400/100mg ) x 12 weeks  Start Date: 02/01/18  Completed Treatment Week #10    Pharmacy: [facility] [phone]     Following topics were reviewed during the phone call:  Patient Counseling    Counseled the patient on the following:  doses and administration discussed, possible adverse effects and management discussed, possible drug and prescription drug interactions discussed, possible drug and OTC drug and food interactions discussed, lab monitoring and follow-up discussed, therapeutic rationale discussed, adherence and missed doses discussed, pharmacy contact information discussed         1. Medication administration - Takes Epclusa every day around 2:00 PM.     2. Importance of adherence -   Medication Adherence    Patient reported X missed doses in the last month:  0  Specialty Medication:  Epclusa  Patient is on additional specialty medications:  No  Patient is on more than two specialty medications:  No  Any gaps in refill history greater than 2 weeks in the last 3 months:  no  Demonstrates understanding of importance of adherence:  yes  Informant:  patient  Reliability of informant:  reliable  Provider-estimated medication adherence level:  90-100%  Patient is at risk for Non-Adherence:  No  Reasons for non-adherence:  no problems identified      Adherence tools used:  directed education               Pill count over the phone revealed #13 tablets which is appropriate.    3. Side effects - Pt reports  my mental acuity has popped up. Pt also reports he has more energy, and his appetitive has improved. Pt states he is working on the 30 g of protein that Dawn suggested for each meal. Pt stated he is eating more robustly.   Adverse Effects        *All other systems reviewed and are negative         4. Drug-drug interaction -Pt denies starting any new medications. Pt denies any alcohol use.   Drug Interactions    Drug interactions evaluated:  yes  Clinically relevant drug interactions identified:  no                 5. Follow up - Has follow up appointment scheduled in HCV treatment clinic on 05/04/18 @ 2:30 PM with Owens Shark, DNP.  Informed patient that there is still a small chance of relapse after finishing the treatment. Stressed importance of follow up 3 months post treatment to assess for cure.      All questions were answered.      Vertell Limber RN, [facility]   Pharmacy Adult GI Medicine  [facility]  [address]   [place], [place] [address]  [phone]    April 13, 2018 10:25 AM

## 2018-04-20 ENCOUNTER — Encounter: Payer: Self-pay | Admitting: *Deleted

## 2018-04-21 NOTE — Unmapped (Signed)
Referral for endoscopic procedures faxed to [facility]

## 2018-04-25 ENCOUNTER — Ambulatory Visit: Admit: 2018-04-25 | Discharge: 2018-04-26 | Payer: MEDICARE

## 2018-04-25 ENCOUNTER — Non-Acute Institutional Stay: Admit: 2018-04-25 | Discharge: 2018-04-26 | Payer: MEDICARE | Attending: Clinical | Primary: Clinical

## 2018-04-25 ENCOUNTER — Encounter: Admit: 2018-04-25 | Discharge: 2018-04-26 | Payer: MEDICARE | Attending: Clinical | Primary: Clinical

## 2018-04-25 DIAGNOSIS — F419 Anxiety disorder, unspecified: Secondary | ICD-10-CM

## 2018-04-25 DIAGNOSIS — G894 Chronic pain syndrome: Principal | ICD-10-CM

## 2018-04-25 DIAGNOSIS — Z79891 Long term (current) use of opiate analgesic: Secondary | ICD-10-CM

## 2018-04-25 DIAGNOSIS — M47816 Spondylosis without myelopathy or radiculopathy, lumbar region: Secondary | ICD-10-CM

## 2018-04-25 LAB — TOXICOLOGY SCREEN, URINE
AMPHETAMINE SCREEN URINE: <500
BARBITURATE SCREEN URINE: <200
BENZODIAZEPINE SCREEN, URINE: <200
CANNABINOID SCREEN URINE: <20
COCAINE(METAB.)SCREEN, URINE: <150
OPIATE SCREEN URINE: <300

## 2018-04-25 LAB — COCAINE(METAB.)SCREEN, URINE: Lab: <150

## 2018-04-25 MED ORDER — TRAMADOL 50 MG TABLET
Freq: Four times a day (QID) | ORAL | 0 refills | 0.00000 days | Status: CP
Start: 2018-04-25 — End: 2018-04-25

## 2018-04-25 MED ORDER — TRAMADOL 50 MG TABLET: 50 mg | each | Freq: Four times a day (QID) | 0 refills | 0 days | Status: AC
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
## 2018-04-25 NOTE — Unmapped (Signed)
Department of Anesthesiology  [facility]  [address]  [place], [place] [address]  [phone]    Date: April 30, 2018  Patient Name: Peter Mejia  MRN: [id number]  PCP: Pamala Hurry Revelo  Referring Provider: Preston Fleeting*    Assessment:   Attending: San Jetty is a 67 y.o. male. [facility] pain localized to cervical spine, lower back, and legs. He reports that his pain started in December 2018, first presenting itself as flu symptoms. When his symptoms persisted, he scheduled a doctor's visit at which point he describes his pain migrated towards his right flank and RUQ in the light of prior Hep C diagnosis, he is currently undergoing Hep C treatment and reportedly will be done with it tomorrow.    Since last visit, pt has been evaluated by Dr. Willette Brace for COM. After discussion with her, we have agreed to take over his Tramadol prescription at today's visit. His pain today is relatively unchanged since last visit. He continues to endorse neck and shoulder pain along with BLE weakness. He has been doing at-home exercises and stretching to help his shoulder tightness, which has been helpful. He relates to me his concerns about possible ALS vs MS contributing to his symptoms, but I do not see an evidence of such on imaging. I have reviewed with him the imaging I have ordered (lumbar/thoracic spine MRI) which shows multilevel degenerative changes in the lumbar spine, with mild bilateral neuroforaminal narrowing at L5-S1, and mild right neuroforaminal narrowing at L4-L5 but with no evidence of high-grade canal or neural foraminal narrowing. He certainly has reasons for back pain as he has multilevel facet arthropathy which we may consider to treat with lumbar facet injections in the future, however facet arthropathy does not explain his diffuse weakness and stiffness in BUE and BLE musculature, although on exam he appears to have uniform and adequate 5/5 muscle strength with exception of hip flexion which is 4/5. At this point I believe the patient should first follow up with neurology to determine etiology of his symptoms, thus will I will hold of with any interventions at this point. In fact, he has an appointment with neurology tomorrow. His upper extremity discomfort may in fact be related to cervical radiculopathy as his cervical spine MRI shows evidence of cervical spondylosis, worst at C5-C6, where there is moderate canal and bilateral neural foraminal stenosis.     Regarding pharmacological therapy, I will be taking over his Tramadol prescription today of 50 mg every 6 hours. Will obtain UDS and opioid agreement today, the NCCSRS database was reviewed and is appropriate, the patient denies any misuse abuse or diversion of medication and reports an medication significantly improve patient's quality of life as well as functionality status. The patient denies any medication associated side effects and wishes to continue on medications as currently prescribed. Risks and benefits of opiate medications were once again discussed with patient and patient describes his understanding.    I have reviewed this patient's treatment plan and have determined that opioid management, potentially at high doses, is appropriate for this patient based on underlying pathology, prior history in the clinic, co-morbidities, risk assessment, adverse effects of non-opioid treatments, and continued close monitoring for risk, benefits, and side effects.      These measures fulfill the spirit and intent of [employer] professional regulatory board advice and statements to maintain a safe  care environment for patients, individualize care, while keeping responsible medication management at the forefront of the treatment plan      1. Chronic pain syndrome    2. Lumbar facet arthropathy    3. Long term current use of opiate analgesic        General Recommendations: The pain condition that the patient suffers from is best treated with a multidisciplinary approach that involves an increase in physical activity to prevent de-conditioning and worsening of the pain cycle, as well as psychological counseling (formal and/or informal) to address the co-morbid psychological affects of pain.  Treatment will often involve judicious use of pain medications and interventional procedures to decrease the pain, allowing the patient to participate in the physical activity that will ultimately produce long-lasting pain reductions.  The goal of the multidisciplinary approach is to return the patient to a higher level of overall function and to restore their ability to perform activities of daily living.      Plan:     - Will prescribe Tramadol 50 mg q6h  - Obtain UDS and opioid agreement today   - Continue following with Dr. Willette Brace  - Follow up with Neurology tomorrow  - May consider lumbar facet injections in the future   - Follow up in one month    Future Considerations:  Butrans  Low dose Naltrexone    Orders Placed This Encounter   Procedures   ??? Toxicology Screen, Urine     OPIATE CONFIRMATION   ??? Opiate Confirmation, Urine     Requested Prescriptions     Signed Prescriptions Disp Refills   ??? traMADol (ULTRAM) 50 mg tablet 120 each 0     Sig: Take 1 tablet (50 mg total) by mouth Four (4) times a day.         Subjective:     HPI:  Peter Mejia is seen in follow-up for evaluation and recommendations regarding His chronic in cervical paraspinals, generalized back pain and knee pain bilaterally, which has been ongoing since December 2018. He saw a physican in March 2019 at which point he noticed pain migrating from his back anteriorly to RUQ. The pattern of his pain prompted his physician to think the pain was liver related. He has had liver issues for over 40 years and was recently started on hepatitis medication.     Patient returns today reporting that he is doing about the same since last visit. He continues to endorse pain primarily to BUE in the shoulders down to his elbows. He has been doing exercises at home in the mornings to help loosen up the muscles, which has been helpful. He does endorse shoulder tightness causing decreased range of motion at times. Denies any associated numbness or tingling, but does experience some weakness. Pt has a concern that he has some sort of auto-immune disorder that makes him weak and is looking forward to getting answers as to the nature of his condition during his neurology appointment tomorrow. He also endorses pain to his bilateral knees and associated weakness in his BLE, which is relatively unchanged since last visit.     Of note, pt is almost done with his hepatitis C treatment. He takes his last dose of medication tomorrow. Some of his myalgias may remain to be Hep C related.    He is currently taking Tramadol 50 mg every 6 hours, but reports that he runs out of that today. He was seen by Dr. Willette Brace today, who deemed him an appropriate  opioid candidate. He plans on continuing to follow up with her. He has also been taking tylenol 500 mg QID, but does not feel that it helps. He is inquiring about adding a new medication to his regimen if possible.    Current Medications:  Tramadol 50mg  q6h  Tylenol 500 mg four times per day    The patient states his pain is located to back, right shoulder, bilateral knees, bilateral feet, right thigh and the severity of his pain ranges from 4/10 to 9/10.  His pain currently is 8/10 and on average is 8.5/10. He describes the sensation of his pain as burning, sharp. His pain is present all of the time and worst mornings. The patient???s pain impacts enjoyment of life, general activity, mood, normal work, recreational activities, relationships, walking, standing. His interval history includes PCP visit. His pain is worse, and he does not have new pain to discuss today. He is not on blood thinners or anti-coagulants. In regards to medications currently taken for pain management, the patient is tolerating these medications well and complains of associated side effects: ankle swelling, constipation.      Previous Medication Trials: Naproxen/Aleve and Tramadol    Allergies as of 04/25/2018   ??? (No Known Allergies)      Current Outpatient Medications   Medication Sig Dispense Refill   ??? acetaminophen (TYLENOL) 500 MG tablet Take 500 mg by mouth Every six (6) hours.      ??? sofosbuvir-velpatasvir (EPCLUSA) 400-100 mg tablet Take 1 tablet by mouth daily. 28 tablet 0   ??? traMADol (ULTRAM) 50 mg tablet Take 1 tablet (50 mg total) by mouth Four (4) times a day. 120 each 0     No current facility-administered medications for this visit.        Imaging/Tests:     MRI Thoracic Spine 03/14/18:  FINDINGS: Levoscoliosis of the thoracic spine with apex at T3-T4. Straightening of the lower thoracic spine. The vertebral body heights are maintained. There is mild multilevel disc desiccation. Scattered small vertebral hemangiomas, largest at T11. No abnormal contrast enhancement. The spinal cord is normal in signal.  ??  Minimal left subarticular protrusion at T6-T7. No significant canal stenosis. No significant neural foraminal stenosis.  ??  IMPRESSION:  Mild degenerative changes in the thoracic spine without high-grade neural foraminal or canal stenosis.  ??  No abnormal signal or enhancement in the cord.    MRI Cervical Spine 03/14/18:  FINDINGS: Reversal of the cervical lordosis at C3-C4. Grade 1 anterolisthesis of C2 on C3 and C3 on C4. Stepwise grade 1 retrolisthesis from C4 to C7.  ??  The marrow signal is heterogeneous. The spinal cord is normal in signal. There is no abnormal contrast enhancement.  ??  C2-C3: No significant canal or neural foraminal stenosis.  ??  C3-C4: Disc bulge, uncovertebral arthrosis. Moderate left neural foraminal stenosis, unchanged. No significant canal or right neural foraminal stenosis.  ??  C4-C5: Uncovertebral arthrosis. Mild left neural foraminal stenosis, unchanged. No significant canal or right neural foraminal stenosis.  ??  C5-C6: Disc bulge, ligamentum flavum thickening and uncovertebral arthrosis. Moderate canal and severe bilateral neural foraminal stenosis, worse on the left, unchanged.  ??  C6-C7: Disc bulge, uncovertebral arthrosis and ligamentum flavum thickening. Mild canal and severe right neural foraminal stenosis.  ??  C7-T1: No significant canal or neural foraminal stenosis.  ??  IMPRESSION:  Unchanged cervical spondylosis, worst at C5-C6, where there is moderate canal and bilateral neural foraminal stenosis.  MRI Lumbar Spine 02/21/18:  FINDINGS: For the purposes of this dictation, the lowest well formed intervertebral disc space is assumed to be the L5-S1 level, and there are presumed to be five lumbar-type vertebral bodies.  ??  Mild thoracic dextroscoliosis. Mild lumbar levoscoliosis. The vertebral bodies are normally aligned. Vertebral body heights are preserved. Multilevel intervertebral disc height loss most prominently at L5-S1. T11 osseous hemangioma. The conus medullaris ends at a normal level.  ??  At L1-2, mild bulges, mild facet arthropathy, and hypertrophy of the ligamentum flavum, resulting in mild indentation on the ventral thecal sac. No significant neuroforaminal narrowing.  ??  At L2-3, mild disc bulge, facet arthropathy, and mild thickening of the ligamentum flavum there is no significant neuroforaminal narrowing.  ??  At L3-4, disc bulge, facet arthropathy, and hypertrophy of the ligamentum flavum resulting in mild indentation on the ventral lateral thecal sac with narrowing of the right lateral recess. No significant neuroforaminal narrowing.  ??  At L4-5, there is a mild circumferential disc bulge, facet arthropathy, and thickening of ligamentum flavum without significant central canal narrowing. There is mild right neuroforaminal narrowing.  ??  At L5-S1, there is degenerative disc height loss with lateral foraminal disc bulges bilaterally resulting in mild, left greater than right bilateral neuroforaminal narrowing. No significant central canal stenosis.  ??  There is no significant spinal canal or neural foraminal stenosis in the thoracic spine.  ??  IMPRESSION:  - Multilevel degenerative changes in the lumbar spine, as above with mild bilateral neuroforaminal narrowing at L5-S1, and mild right neuroforaminal narrowing at L4-L5. No high-grade canal or neural foraminal narrowing.        Urine toxiciology screen  Lab Results   Component Value Date    Amphetamine Screen, Ur <500 ng/mL 04/25/2018    Barbiturate Screen, Ur <200 ng/mL 04/25/2018    Benzodiazepine Screen, Urine <200 ng/mL 04/25/2018    Cannabinoid Scrn, Ur <20 ng/mL 04/25/2018    Methadone Screen, Urine <300 ng/mL 04/25/2018    Opiate Scrn, Ur <300 ng/mL 04/25/2018    Cocaine(Metab.)Screen, Urine <150 ng/mL 04/25/2018       OPIOID CONFIRMATION:  Lab Results   Component Value Date    Hydrocodone-by LC-MS/MS Negative 04/25/2018        BENZODIAZEPINE CONFIRMATION:  No results found for: CDIFFTOX, CDIFFTOX, OHALP, CLONU, HDXYFLRAZUR, 7NHFLU, DESALKYCONF, LORAZURQT, HDXYTRIAZUR, MIDAZURQT, BNZU    Review Of Systems  General weight loss, fatigue, daytime drowsiness  Cardiovascular chest pain, swelling of ankles/legs  Gastrointestinal bloody stool/black stool, diarrhea and constipation  Skin dryness  Endocrine excess urination, change in hair or skin texture, intolerance to heat or cold, decreased sex drive, decreased sexual performance  Musculoskeletal joint aches/swelling, back pain, muscle aches/weakness, muscle wasting  Neurologic dizziness/vertigo, numbness/tingling  Psychiatric anxiety, low energy, agitation, anger, lack of interest in activities, feeling hopeless/helpless        Objective:     PHYSICAL EXAM:  Vitals:    04/25/18 1545   BP: 119/71   Pulse: 72   Resp: 16   Temp: 36.7 ??C (98 ??F)   SpO2: 98% Wt Readings from Last 3 Encounters:   04/26/18 73.6 kg (162 lb 3.2 oz)   04/25/18 73 kg (161 lb)   02/28/18 74.4 kg (164 lb 1.6 oz)     GENERAL:  WD/WN slender male in NAD, arrived in wheelchair, well groomed  HEAD/NECK:    Rondo/AT, PERRLA, EOMI  CARDIOVASCULAR:   RR  RESPIRATORY:   NWB  EXTREMITIES:  Well  perfused, warm, mild LE edema noted  NEUROLOGIC:    The patient was alert and oriented times 3    Cranial nerve II-XII intact  Sensation Intact to light touch throughout the bilateral upper and lower extremities.  MUSCULOSKELETAL:    Appropriate muscle mass in the upper and lower extremities bilaterally.    Strength 5/5 in upper extremities to flexion and extension, +4/5 in LEB hip flexion, 5/5 knee extension  REFLEXES:  Reflexes were +3 in BLE patellar,  +2 Achilles, +2 BUE, + clonus ankle 5 beats bilaterally  Preserved sensation in all extremities to touch, pin prick and cold swab  SPINE:  Facet tenderness bilat in cervical and lumbar spine  SKIN:   No obvious rashes lesions or erythema on the exposed skin  PSYCHIATRIC:  Appropriate affect today in clinic    Scribe's Attestation: Clarene Essex, MD obtained and performed the history, physical exam and medical decision making elements that were  entered into the chart. Documentation assistance was provided by me personally, a scribe. Signed by Maple Hudson, scribe, on April 25, 2018 at 1:09 PM.     ----------------------------------------------------------------------------------------------------------------------  April 30, 2018 9:48 PM. Documentation assistance provided by the Scribe. I was present during the time the encounter was recorded. The information recorded by the Scribe was done at my direction and has been reviewed and validated by me.  ----------------------------------------------------------------------------------------------------------------------

## 2018-04-25 NOTE — Unmapped (Addendum)
It was good to see you!    Today we did the following:  -I have prescribed Tramadol 50 mg to take once every 6 hours  -Continue tylenol 500 mg up to four times per day as needed  -Follow up with neurology tomorrow  - Urine toxicology today  - Treatment agreement renewal  -Return to clinic to see me and Dr. Willette Brace in one month

## 2018-04-26 ENCOUNTER — Ambulatory Visit
Admit: 2018-04-26 | Discharge: 2018-04-27 | Payer: MEDICARE | Attending: Student in an Organized Health Care Education/Training Program | Primary: Student in an Organized Health Care Education/Training Program

## 2018-04-26 DIAGNOSIS — G959 Disease of spinal cord, unspecified: Principal | ICD-10-CM

## 2018-04-26 LAB — C4 COMPLEMENT: Complement C4:MCnc:Pt:Ser/Plas:Qn:: 35.1

## 2018-04-26 LAB — C3 COMPLEMENT: Complement C3:MCnc:Pt:Ser/Plas:Qn:: 106

## 2018-04-26 NOTE — Unmapped (Signed)
Treatment Agreement, AVS and Rxes reviewed.  Below controlled  Rxes sent electronically to pt's pharmacy.v  raMADol (ULTRAM) 50 mg tablet 120 each 0 04/25/2018     Sig - Route: Take 1 tablet (50 mg total) by mouth Four (4) times a day. - Oral    Sent to pharmacy as: traMADol 50 mg tablet Janean Sark)    E-Prescribing Status: Receipt confirmed by pharmacy (04/25/2018 ??3:55 PM EST)        Pt voiced understanding.

## 2018-04-26 NOTE — Unmapped (Signed)
[facility]  [facility]   [address]  [place] [address]  [phone]  Division of Neuromuscular disorders  Peter Bible, MD  Clinical Assistant Professor    Date: April 26, 2018  Patient Name: Peter Mejia  MRN: [id number]  PCP: Creola Corn, MD    Peter Mejia is a 67 y.o. Caucasian right handed male seen in consultation at the [facility] at the request of Peter Mejia for evaluation of muscle weakness and a concern for MND.  The following is the information obtained from the patient, , from his electronic medical records from [facility].    Assessment:      [name] is a 67 y.o. male who  has a past medical history of Back pain, HCV (hepatitis C virus), Hepatitis C, and Osteoarthritis. presenting in consultation for evaluation of MND.      The DDx should include autoimmune/inflammaotry myelopathy:  He had following evaluations since his last clinic visit:  ANA/ENA: 1:320/ equivocal for RNP-Abs  Immunofixation: The SPE pattern demonstrates an irregularity in the gamma region, which may represent monoclonal protein. Comment: Monoclonal component typed as IgM Lambda.   Concentration of monoclonal protein is too low to accurately quantify.  Cryoglobulin, B12 , Copper, Vit E, paraneoplastic panel - negative/normal     Plan:      Blood work:  - repeat Immunofixation  - repeat ANA  - obtain free light chains  - obtain dsDNA, C3, C4  LP with CSF:   basic: cell count, protein and glucose; CMV, EBV, HHV-6, oligoclonal bands, VDRL    - I'll see him back in 2 months to review all these test results.       More than 50 % of today's 25 minute visit was dedicated to face-to-face counseling on test results, diagnosis, treatment and prognosis.    Subjective:      Interval history:  Patient continues complaining of pain in the morning, severe stiffness and coordination issues. He reports stiffness and deformation in his knee and hand joints, which is more profound in the morning. However, he reports that this gets better through the day, however, he has been experiencing stiffness in his thigh muscles, which is different from his joint pain. He has quite severe restrictions of his ambulation because of aforementioned symptoms, he can not walk much, even to reach to his mailbox on some days.     HPI:  Initial symptoms started with arthritis in his knees in November- December 2018. 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 In December 2018 he started feeling lethargic: he didn't have energy and some pain in his liver area. He has been diagnosed with Hep C in 1978.  Then he started feeling that he is starting loosing muscle bulk and since March 2019 it became very difficult to walk, take stares, lifting his right arm.     [facility]              MYOPATHY QUESTIONNAIRE                PLEASE ANSWER QUESTIONS AS THEY PERTAIN TO THE LAST 1 WEEK     FUNCTION     GRADE     SCORE     no changes compare to baseline mild dysfunction, 1-30 % less compare to baseline  moderate dysfunction, 30-60%  less compare to baseline  severe dysfunction, 60-90%  less compare to baseline  advanced stage- unable        0 1 2 3 4      Chewing  x             Swallowing, some, no episodes of chocking, but  He needs water sometimes to move the food     x           Holding head up  x             Raising arms up: combing hair, placing objects on the shelves: he has to think how to lift his arms up, he also has weakness and pain in his muscles and jopints        x       Lifting heavy objects, he can hold gallon of milk now, but has to assist with the other hand, used to do 50 lbs      x         Carrying heavy objects      x         Hand grip  x             Dexterity: buttoning, writing, picking up small objects  x             Sitting from lying position, mild difficult     x           Standing form sitting position, very difficuolt      x         Maintaining standing position a few minutes      x         Walking, 50 steps        x       Running, last time 3-4 years ago               Going up stairs, don't know               Muscle pain, 10/10        x       Breathing while lying down/ sitting/ with physical activities  x                       Total score:     Other               Weight loss, over 10 lbs in 6 montsh      x         Skin changes               Joint pain      x         Swelling    x             Muscle pain: with movements, 10/10, at rest he has no pain    Bowel: he has BM 2-3 days, thsi was daily before  bludder: frequent urination: since April 2019, both, during the day or at the night  Muscle cramps: no cramps, only big toe extension  Muscle twitching  Muscle waisting   Memory: nothing severe  Behavioral changes: more anxiety  Lives alone  Numbness and tingling: sometimes in his right foot    I have personally reviewed his images:  MRIs: brain, C-spine, T-spine and L spine , personally reviewed:  - he has multilevel DDD with moderate spinal stenosis, no signal changes in the spine. He has numerous  changes in the vertebrae, this was not commented on the read.     CK normal, cryoglobulin normal, AST and ALT elevated 97/74 from 7/19 and normal on 02/28/18, hepatitis B Abs/Ag- normal, TSH 1.634, positive Hep C, normal RF      Past Medical Hx:    Past Medical History:   Diagnosis Date   ??? Back pain    ??? HCV (hepatitis C virus)    ??? Hepatitis C    ??? Osteoarthritis        Past Surgical Hx:    Past Surgical History:   Procedure Laterality Date   ??? KNEE ARTHROSCOPY Left 1982    meniscus   ??? TONSILLECTOMY         Social Hx:    Social History     Socioeconomic History   ??? Marital status: Single     Spouse name: None   ??? Number of children: 0   ??? Years of education: None   ??? Highest education level: Some college, no degree   Occupational History   ??? None   Social Needs   ??? Financial resource strain: None   ??? Food insecurity:     Worry: None     Inability: None   ??? Transportation needs:     Medical: None     Non-medical: None   Tobacco Use   ??? Smoking status: Never Smoker   ??? Smokeless tobacco: Never Used   Substance and Sexual Activity   ??? Alcohol use: Never     Frequency: Never   ??? Drug use: Not Currently     Types: Cocaine   ??? Sexual activity: Not Currently     Partners: Female   Lifestyle   ??? Physical activity:     Days per week: None     Minutes per session: None   ??? Stress: None   Relationships   ??? Social connections:     Talks on phone: None     Gets together: None     Attends religious service: None     Active member of club or organization: None     Attends meetings of clubs or organizations: None     Relationship status: None   Other Topics Concern   ??? Exercise Not Asked   ??? Living Situation Not Asked   Social History Narrative   ??? None       Family Hx:    Family History   Problem Relation Age of Onset   ??? Lymphoma Mother         non hodkins   ??? Heart attack Father        ALLERGIES:  No Known Allergies    CURRENT MEDICATIONS:  Prior to Admission medications    Medication Sig Start Date End Date Taking? Authorizing Provider   acetaminophen (TYLENOL) 100 mg/mL suspension Take 1,000 mg by mouth.    Historical Provider, MD   EPCLUSA 400-100 mg tablet TAKE 1 TABLET BY MOUTH ONCE DAILY 01/26/18 01/26/19  Calton Golds, MD   traMADol Janean Sark) 50 mg tablet Take 50 mg by mouth Four (4) times a day.  02/09/18   Historical Provider, MD       Review of Systems:  A 10-systems review was performed and, unless otherwise noted, declared negative by patient.     Objective:     Vitals:    04/26/18 1047   BP: 110/70   BP Site: R Arm   BP Position: Sitting  BP Cuff Size: Medium   Pulse: 70   Weight: 73.6 kg (162 lb 3.2 oz)   Height: 179.1 cm (5' 10.51)          Physical Exam:  GENERAL:NAD, awake, alert, well nourished   Eyes: no scleral injection, no tearing  ENT: Moist mucous membranes of the oral cavity.   Neck: Supple, no carotid bruits   Cardiovascular: RRR, w/o any murmurs, rub, or gallop.   Lungs: CTAB, no wheezes/crackles/rhonchi.   Skin: No rash, lesions, breakdown   Psychiatry: Mood and affect appropriate   Abdomen: soft, non tender, non distended,   Extremeties: No cyanosis, clubbing or edema.     Neurological Examination:   Mental Status:   alert, oriented to person, place and time. Speech with no  dysarthria, able to name and repeat with no  difficulty.  He has difficulties with following up commands: he has to repeat the command and f/u on it very slowly, but eventually is abl;e to do.     Cranial Nerves:   II, III- Pupls are equal 3 mm and reactive to light b/l, visual fields are intact   III, IV, VI- extraocular movements are intact, No ptosis, no nystagmus.  V- sensation is intact on the face   VII- face symmetrical, no facial droop, normal facial movements with smile/grimace  VIII- Hearing grossly intact.  IX and X- symmetric palate contraction, normal gag bilaterally  XI- Full shoulder shrug bilaterally  XII- Tongue protrudes midline, full range of movements of the tongue    Motor Exam:    Normal bulk and tone, no abnormal spontaneous movements observed in the muscles, such as fasciculations, myokymia.   Patient did not have high arched feet, hammer toes, kyphoscoliosis.    5/5 neck flexion/extension  Eye closure: normal  Buccinator: normal  Jaw strength: normal    Muscle strength in the upper and lower extremities was assessed using MRC score (Right/Left). There was no pain with passive and active movements of the upper and lower extremities.  Arm Abduction (deltoids, supraspinati): 5/5   Arm Adduction: 5/5  Arm Flexion: 5/5    Arm Extension: 5/5   Wrist Extension: 5/5  Wrist Flexion: 5/5   Hand grip: 5/5   Long Finger Flexors: median innervated 5/5, ulnar innervated 5/5  Finger Extensors: 5/5   Interossei: 4/4  Hip Flexion: 4-/4-   Knee Flexion: 5/5  Knee Eextension: 5/5   Foot Dorsiflexion: 5/5   Foot Plantar flexion: 5/5   Big toe Dorsiflexion: 5/5   Big toe Plantar flexion: 5/5       Sensory UEs LEs    R L R L   Temperature WNL WNL WNL WNL   Vibration WNL WNL WNL WNL       Reflexes R L   Biceps +2, brisk +2   Triceps +2 +2   Patella +2 +2   Achilles +3 +1     Ankle clonus 2+  Negative Hoffmann's sign b/l     Coordination: FtN without dysmetria or ataxia. Foot tap is normal    Gait: small steps, wide base gait, unsteady in [place], unable to tandem and walk on toes and heels       Diagnostic Studies and Review of Records:      Please see in HPI

## 2018-04-26 NOTE — Unmapped (Addendum)
You were seen in the neuromuscular clinic for evaluation of weakness and pian.    Plan:  - blood work today: repeat ANA/ENA, SPEP; obtain free light chains, dsDNA, C3 and C4  - lumbar puncture and CSF for: glucose, protein, cell count, oligoclonal bands, HHV-6, CMV, EBV    I'll see you back in 2 months

## 2018-04-27 LAB — DSDNA ANTIBODY: Lab: NEGATIVE

## 2018-04-28 LAB — OPIATE, URINE, QUANTITATIVE
CODEINE-BY LC-MS/MS: NEGATIVE ng/mL
DIHYDROCODEINE-BY LC-MS/MS: NEGATIVE ng/mL
HYDROMORPHONE-BY LC-MS/MS: NEGATIVE ng/mL
OPIATES INTERPRETATION: NEGATIVE
OXYCODONE-BY LC-MS/MS: NEGATIVE ng/mL
OXYMORPHONE-BY LC-MS/MS: NEGATIVE ng/mL

## 2018-04-28 LAB — MORPHINE-BY LC-MS/MS: Morphine:MCnc:Pt:Urine:Qn:Confirm: NEGATIVE

## 2018-04-28 LAB — IMMUNOGLOBULIN FREE LT CHAINS BLOOD
K/L FLC RATIO: 1.48 (ref 0.26–1.65)
LAMBDA FREE, SER: 1.92 mg/dL (ref 0.57–2.63)

## 2018-04-28 LAB — ANA TITER 1: Lab: 1:160 {titer}

## 2018-04-28 LAB — LAMBDA FREE, SER: Lab: 1.92

## 2018-04-28 LAB — ANA: ANA TITER 1: 1:160 {titer}

## 2018-04-30 DIAGNOSIS — Z79891 Long term (current) use of opiate analgesic: Secondary | ICD-10-CM | POA: Insufficient documentation

## 2018-04-30 DIAGNOSIS — M47816 Spondylosis without myelopathy or radiculopathy, lumbar region: Secondary | ICD-10-CM | POA: Insufficient documentation

## 2018-05-01 LAB — ENA SCREEN: Lab: 0.8 — ABNORMAL HIGH

## 2018-05-01 NOTE — Unmapped (Signed)
Addended by: Tommie Raymond on: 05/01/2018 07:54 AM     Modules accepted: Orders

## 2018-05-03 LAB — ANTI-SM AB: Lab: NEGATIVE

## 2018-05-03 LAB — ENA PANEL
ANTI-JO1 AB: NEGATIVE
ANTI-SCL-70 AB: NEGATIVE
ANTI-SM AB: NEGATIVE
ANTI-SSA AB: NEGATIVE

## 2018-05-03 NOTE — Unmapped (Signed)
Patient is requesting the Lumbar puncture be done at [facility] because it is easier to get to as opposed to [facility].    He has spoken with the hospital and they state than a doctor from [facility] comes to [facility] weekly to do the procedure.    Please call him at [phone] if Dr. Lanora Manis is ok with this and referral will need to be sent to [place].

## 2018-05-05 NOTE — Unmapped (Signed)
Patient called about getting LP done locally (see message from [place]).  He would like to hear back today on plan.  He is anxious to get this in place.  [facility] cannot do until 07/07/2018.    His phone: [phone]

## 2018-05-08 NOTE — Unmapped (Signed)
Dr. Lanora Manis: patient is anxious to know if you authorized the LP to be done locally. Please let me know and I will fax the order.

## 2018-05-08 NOTE — Unmapped (Signed)
Left message for patient to call me back. 

## 2018-05-09 NOTE — Unmapped (Signed)
Patient called with multiple questions about his plan of care. He missed his last clinic appointment . Will change Peter Mejia???s appointment on Jun 22, 2018 to the first week in February. His SVR date is Jan 29. He is going to get labs done at [facility] today for EOT and again the last week of January.  E-mail to K. Mims.  Orders to [facility] via portal.  Phone x 15 minutes

## 2018-05-10 NOTE — Unmapped (Signed)
Left message for patient to call me back. 

## 2018-05-15 NOTE — Unmapped (Signed)
Patient called to request Peter Mejia call him back. He is wanting to know about an option for an LP for himself.    Please call at [phone].

## 2018-05-15 NOTE — Unmapped (Signed)
Spoke with patient and he will contact his insurance for guidance in who can perform an LP locally. He will communicate with [place] once he coordinates.

## 2018-05-16 ENCOUNTER — Encounter: Admit: 2018-05-16 | Discharge: 2018-05-16 | Payer: MEDICARE

## 2018-05-16 ENCOUNTER — Encounter: Admit: 2018-05-16 | Discharge: 2018-05-16 | Payer: MEDICARE | Attending: Clinical | Primary: Clinical

## 2018-05-16 DIAGNOSIS — M179 Osteoarthritis of knee, unspecified: Secondary | ICD-10-CM | POA: Insufficient documentation

## 2018-05-16 DIAGNOSIS — K5903 Drug induced constipation: Principal | ICD-10-CM

## 2018-05-16 DIAGNOSIS — F419 Anxiety disorder, unspecified: Principal | ICD-10-CM

## 2018-05-16 DIAGNOSIS — T402X5A Adverse effect of other opioids, initial encounter: Secondary | ICD-10-CM

## 2018-05-16 DIAGNOSIS — G894 Chronic pain syndrome: Secondary | ICD-10-CM

## 2018-05-16 DIAGNOSIS — M47816 Spondylosis without myelopathy or radiculopathy, lumbar region: Secondary | ICD-10-CM

## 2018-05-16 LAB — TEST INFORMATION:

## 2018-05-16 LAB — HCV RT-PCR

## 2018-05-16 MED ORDER — SENNOSIDES 8.6 MG TABLET
ORAL_TABLET | Freq: Every day | ORAL | 2 refills | 0 days | Status: CP
Start: 2018-05-16 — End: 2019-01-23

## 2018-05-16 MED ORDER — TRAMADOL 50 MG TABLET
Freq: Four times a day (QID) | ORAL | 1 refills | 0.00000 days | Status: CP
Start: 2018-05-16 — End: 2018-07-25

## 2018-05-16 MED ORDER — DOCUSATE SODIUM 100 MG CAPSULE
Freq: Two times a day (BID) | ORAL | 2 refills | 0.00000 days | Status: CP
Start: 2018-05-16 — End: ?

## 2018-05-16 MED ORDER — GABAPENTIN 300 MG CAPSULE
ORAL_CAPSULE | Freq: Three times a day (TID) | ORAL | 2 refills | 0 days | Status: CP
Start: 2018-05-16 — End: 2018-06-12

## 2018-05-16 NOTE — Unmapped (Signed)
Medication:TRAMADOL 50MG   ZOX:WRUE 1 TABLET 4 TIMES A DAY   Quantity on RX: #120  Filled on:04/25/18  Pill count today: #45

## 2018-05-16 NOTE — Unmapped (Signed)
Left message for patient to call me back. 

## 2018-05-16 NOTE — Unmapped (Signed)
Department of Anesthesiology  [facility]  [address]  [place], [place] [address]  [phone]    Date: May 16, 2018  Patient Name: Peter Mejia  MRN: [id number]  PCP: Crisoforo Oxford Revelo  Referring Provider: Preston Fleeting*    Assessment:   Attending: San Jetty is a 67 y.o. male. He is [facility] for diffuse chronic pain. His primary pain generator are low back and legs. He reports that his pain started in December 2018, first presenting itself as flu symptoms. When his symptoms persisted, he scheduled a doctor's visit at which point he describes his pain migrated towards his right flank and RUQ in the light of prior Hep C diagnosis, he is currently undergoing Hep C treatment and reportedly will be done with it tomorrow.    His pain today is relatively unchanged since last visit. He continues to endorse low back pain along with BLE weakness. I have reviewed with him the imaging I have ordered (lumbar/thoracic spine MRI) which shows multilevel degenerative changes in the lumbar spine, with mild bilateral neuroforaminal narrowing at L5-S1, and mild right neuroforaminal narrowing at L4-L5 but with no evidence of high-grade canal or neural foraminal stenosis. He certainly has reasons for back pain as he has multilevel facet arthropathy which we may consider to treat with lumbar facet injections in the future, however facet arthropathy does not explain his diffuse weakness and stiffness in BUE and BLE musculature, although on exam he appears to have uniform and adequate 5/5 muscle strength with exception of hip flexion which is 4/5.  At this point, I would like to hold off on any interventions until pt follows up with neurology again in January to determine the etiology of his symptoms. His upper extremity discomfort may in fact be related to cervical radiculopathy as his cervical spine MRI shows evidence of cervical spondylosis, worst at C5-C6, where there is moderate canal and bilateral neural foraminal stenosis.     Patient also endorses bilateral knee pain and has been following with [facility]. He reportedly has undergone bilateral hyaluronic acid injections approximately 8 weeks ago, but did not find any relief. Offered genicular nerve blocks, but patient would like to hold off for now.     Regarding pharmacological therapy, I will refill Tramadol prescription today of 50 mg every 6 hours. Pt endorses associated OIC, reporting one BM every 3-5 days. He has been taking Senna, but not regularly. Advised that he takes this once daily along with Colace 100 mg twice per day. Also discussed initiating Gabapentin today, but patient would like to hold off for now until he talks with hepatologist. If approved, pt can titrate up to 300 mg TID. Instructions were provided.     The patient has updated treatment agreement on file and urine toxicology was appropriate and within the past 12 months, the [place]CC[place] database was reviewed and is appropriate, the patient denies any misuse abuse or diversion of medication and reports an medication significantly improve patient's quality of life as well as functionality status. The patient denies any medication associated side effects and wishes to continue on medications as currently prescribed. Risks and benefits of opiate medications were once again discussed with patient and patient describes his understanding.    I have reviewed this patient's treatment plan and have determined that opioid management, potentially at high doses, is appropriate for this patient based on underlying pathology, prior history  in the clinic, co-morbidities, risk assessment, adverse effects of non-opioid treatments, and continued close monitoring for risk, benefits, and side effects.      These measures fulfill the spirit and intent of [place] professional regulatory board advice and statements to maintain a safe care environment for patients, individualize care, while keeping responsible medication management at the forefront of the treatment plan    Utox: 04/25/18  Opioid Agreement: 04/25/18      No diagnosis found.    General Recommendations: The pain condition that the patient suffers from is best treated with a multidisciplinary approach that involves an increase in physical activity to prevent de-conditioning and worsening of the pain cycle, as well as psychological counseling (formal and/or informal) to address the co-morbid psychological affects of pain.  Treatment will often involve judicious use of pain medications and interventional procedures to decrease the pain, allowing the patient to participate in the physical activity that will ultimately produce long-lasting pain reductions.  The goal of the multidisciplinary approach is to return the patient to a higher level of overall function and to restore their ability to perform activities of daily living.      Plan:     - Refill Tramadol 50 mg q6h prn  - Start Senna 8.6 mg daily  - Start Colace 100 mg BID  - Start Gabapentin 300 mg TID, titration instructions provided. Pt will consult hepatologist prior to initiation  - Continue following with Dr. Willette Brace  - Continue following with neurology   - May consider lumbar facet injections in the future     Future Considerations:  Gabapentin  Genicular nerve blocks  Butrans  Low dose Naltrexone    No orders of the defined types were placed in this encounter.    Requested Prescriptions     Signed Prescriptions Disp Refills   ??? gabapentin (NEURONTIN) 300 MG capsule 90 capsule 2     Sig: Take 1 capsule (300 mg total) by mouth Three (3) times a day.   ??? traMADol (ULTRAM) 50 mg tablet 120 each 1     Sig: Take 1 tablet (50 mg total) by mouth Four (4) times a day. DNF: 05/25/18   ??? senna (SENNA) 8.6 mg tablet 30 tablet 2     Sig: Take 1 tablet by mouth daily.   ??? docusate sodium (COLACE) 100 MG capsule 60 each 2     Sig: Take 1 capsule (100 mg total) by mouth Two (2) times a day.         Subjective:     HPI:  [name] is seen in follow-up for evaluation and recommendations regarding His chronic in cervical paraspinals, generalized back pain and knee pain bilaterally, which has been ongoing since December 2018. He saw a physican in March 2019 at which point he noticed pain migrating from his back anteriorly to RUQ. The pattern of his pain prompted his physician to think the pain was liver related. He has had liver issues for over 40 years and was recently started on hepatitis medication.     Since last visit, pt was seen by neurology and was advised to undergo lumbar puncture for future evaluation. Blood work was also obtained. He has a follow up appointment with them in January to discuss those results.     Patient returns today doing about the same since last visit with no new complaints to discuss. He is currently managed on tramadol 50 mg q6h, which has reportedly been beneficial when he does not overdo  it. He is overall stable on Tramadol, but does not want to get hooked on it. Pt endorses associated constipation. Reports a BM every 3-5 days. He has been using Senna, but not regularly. He has not tried Gabapentin in the past.     Of note, pt received hyaluronic acid injections to his knees about 8 weeks ago, but did not find any benefit from it. He followed back up with orthopedics yesterday for his bilateral knee pain. Pt would like to hold off on TKA for now and try more conservative interventions first. He plans on wearing braces and may be interested in genicular nerve blocks in the future.    Current Medications:  Tramadol 50mg  q6h  Tylenol 500 mg    The patient states his pain is located to knees and the severity of his pain ranges from 7/10 to 9/10.  His pain currently is 7/10 and on average is 8/10. He describes the sensation of his pain as aching. His pain is present all of the time and worst mornings, during the day, evenings, middle of the night, with weather changes. The patient???s pain impacts enjoyment of life, general activity, mood, normal work, recreational activities, relationships, walking, standing. His interval history includes None. His pain has stayed the same, and he does not have new pain to discuss today. He is not on blood thinners or anti-coagulants. In regards to medications currently taken for pain management, the patient is tolerating these medications well and complains of associated side effects: dry mouth, ankle swelling, constipation, agitation.      Previous Medication Trials: Naproxen/Aleve and Tramadol    Allergies as of 05/16/2018   ??? (No Known Allergies)      Current Outpatient Medications   Medication Sig Dispense Refill   ??? acetaminophen (TYLENOL) 500 MG tablet Take 500 mg by mouth Every six (6) hours.      ??? sofosbuvir-velpatasvir (EPCLUSA) 400-100 mg tablet Take 1 tablet by mouth daily. 28 tablet 0   ??? traMADol (ULTRAM) 50 mg tablet Take 1 tablet (50 mg total) by mouth Four (4) times a day. DNF: 05/25/18 120 each 1   ??? docusate sodium (COLACE) 100 MG capsule Take 1 capsule (100 mg total) by mouth Two (2) times a day. 60 each 2   ??? gabapentin (NEURONTIN) 300 MG capsule Take 1 capsule (300 mg total) by mouth Three (3) times a day. 90 capsule 2   ??? senna (SENNA) 8.6 mg tablet Take 1 tablet by mouth daily. 30 tablet 2     No current facility-administered medications for this visit.        Imaging/Tests:     MRI Thoracic Spine 03/14/18:  FINDINGS: Levoscoliosis of the thoracic spine with apex at T3-T4. Straightening of the lower thoracic spine. The vertebral body heights are maintained. There is mild multilevel disc desiccation. Scattered small vertebral hemangiomas, largest at T11. No abnormal contrast enhancement. The spinal cord is normal in signal.  ??  Minimal left subarticular protrusion at T6-T7. No significant canal stenosis. No significant neural foraminal stenosis.  ??  IMPRESSION:  Mild degenerative changes in the thoracic spine without high-grade neural foraminal or canal stenosis.  ??  No abnormal signal or enhancement in the cord.    MRI Cervical Spine 03/14/18:  FINDINGS: Reversal of the cervical lordosis at C3-C4. Grade 1 anterolisthesis of C2 on C3 and C3 on C4. Stepwise grade 1 retrolisthesis from C4 to C7.  ??  The marrow signal is heterogeneous. The spinal cord is  normal in signal. There is no abnormal contrast enhancement.  ??  C2-C3: No significant canal or neural foraminal stenosis.  ??  C3-C4: Disc bulge, uncovertebral arthrosis. Moderate left neural foraminal stenosis, unchanged. No significant canal or right neural foraminal stenosis.  ??  C4-C5: Uncovertebral arthrosis. Mild left neural foraminal stenosis, unchanged. No significant canal or right neural foraminal stenosis.  ??  C5-C6: Disc bulge, ligamentum flavum thickening and uncovertebral arthrosis. Moderate canal and severe bilateral neural foraminal stenosis, worse on the left, unchanged.  ??  C6-C7: Disc bulge, uncovertebral arthrosis and ligamentum flavum thickening. Mild canal and severe right neural foraminal stenosis.  ??  C7-T1: No significant canal or neural foraminal stenosis.  ??  IMPRESSION:  Unchanged cervical spondylosis, worst at C5-C6, where there is moderate canal and bilateral neural foraminal stenosis.     MRI Lumbar Spine 02/21/18:  FINDINGS: For the purposes of this dictation, the lowest well formed intervertebral disc space is assumed to be the L5-S1 level, and there are presumed to be five lumbar-type vertebral bodies.  ??  Mild thoracic dextroscoliosis. Mild lumbar levoscoliosis. The vertebral bodies are normally aligned. Vertebral body heights are preserved. Multilevel intervertebral disc height loss most prominently at L5-S1. T11 osseous hemangioma. The conus medullaris ends at a normal level.  ??  At L1-2, mild bulges, mild facet arthropathy, and hypertrophy of the ligamentum flavum, resulting in mild indentation on the ventral thecal sac. No significant neuroforaminal narrowing.  ??  At L2-3, mild disc bulge, facet arthropathy, and mild thickening of the ligamentum flavum there is no significant neuroforaminal narrowing.  ??  At L3-4, disc bulge, facet arthropathy, and hypertrophy of the ligamentum flavum resulting in mild indentation on the ventral lateral thecal sac with narrowing of the right lateral recess. No significant neuroforaminal narrowing.  ??  At L4-5, there is a mild circumferential disc bulge, facet arthropathy, and thickening of ligamentum flavum without significant central canal narrowing. There is mild right neuroforaminal narrowing.  ??  At L5-S1, there is degenerative disc height loss with lateral foraminal disc bulges bilaterally resulting in mild, left greater than right bilateral neuroforaminal narrowing. No significant central canal stenosis.  ??  There is no significant spinal canal or neural foraminal stenosis in the thoracic spine.  ??  IMPRESSION:  - Multilevel degenerative changes in the lumbar spine, as above with mild bilateral neuroforaminal narrowing at L5-S1, and mild right neuroforaminal narrowing at L4-L5. No high-grade canal or neural foraminal narrowing.        Urine toxiciology screen  Lab Results   Component Value Date    Amphetamine Screen, Ur <500 ng/mL 04/25/2018    Barbiturate Screen, Ur <200 ng/mL 04/25/2018    Benzodiazepine Screen, Urine <200 ng/mL 04/25/2018    Cannabinoid Scrn, Ur <20 ng/mL 04/25/2018    Methadone Screen, Urine <300 ng/mL 04/25/2018    Opiate Scrn, Ur <300 ng/mL 04/25/2018    Cocaine(Metab.)Screen, Urine <150 ng/mL 04/25/2018       OPIOID CONFIRMATION:  Lab Results   Component Value Date    Hydrocodone-by LC-MS/MS Negative 04/25/2018        BENZODIAZEPINE CONFIRMATION:  No results found for: CDIFFTOX, CDIFFTOX, OHALP, CLONU, HDXYFLRAZUR, 7NHFLU, DESALKYCONF, LORAZURQT, HDXYTRIAZUR, MIDAZURQT, BNZU    Review Of Systems  General weight loss, fevers  Cardiovascular swelling of ankles/legs  Gastrointestinal bloody stool/black stool and constipation  Skin dryness  Endocrine excess urination, intolerance to heat or cold, decreased sex drive, decreased sexual performance  Musculoskeletal joint aches/swelling, back pain, muscle aches/weakness, muscle  wasting  Neurologic coordination difficulty, numbness/tingling  Psychiatric anxiety, low energy, agitation, anger, emotional outbursts, lack of interest in activities, feeling hopeless/helpless, isoliting yourself      Objective:     PHYSICAL EXAM:  Vitals:    05/16/18 1359   BP: 121/67   Pulse: 62   SpO2: 100%     Wt Readings from Last 3 Encounters:   04/26/18 73.6 kg (162 lb 3.2 oz)   04/25/18 73 kg (161 lb)   02/28/18 74.4 kg (164 lb 1.6 oz)     GENERAL:  WD/WN CM, NAD  HEAD/NECK:    [place]/AT  CARDIOVASCULAR:   RR, BP WNL  RESPIRATORY:   NWB, no supplemental O2  EXTREMITIES:  Well perfused, warm, mild LE edema noted  NEUROLOGIC:    The patient was alert and oriented times 3    MUSCULOSKELETAL:    Appropriate muscle mass in the upper and lower extremities bilaterally.    Strength 5/5 in upper extremities to flexion and extension, +4/5 in LEB hip flexion, 5/5 knee extension  REFLEXES:  Brisk reflexes diffusely  SPINE:  Facet tenderness bilat in cervical and lumbar spine  Unable to perform facet loading as the patient is in a wheelchair but he describes pain on spinal extension  SKIN:   No obvious rashes lesions or erythema on the exposed skin  PSYCHIATRIC:  Appropriate affect today in clinic    Scribe's Attestation: Clarene Essex, MD obtained and performed the history, physical exam and medical decision making elements that were  entered into the chart. Documentation assistance was provided by me personally, a scribe. Signed by Maple Hudson, scribe, on May 16, 2018 at 1:14 PM.     ----------------------------------------------------------------------------------------------------------------------  May 23, 2018 1:54 PM. Documentation assistance provided by the Scribe. I was present during the time the encounter was recorded. The information recorded by the Scribe was done at my direction and has been reviewed and validated by me.  ----------------------------------------------------------------------------------------------------------------------

## 2018-05-16 NOTE — Unmapped (Signed)
Confidential Psychological Therapy Session  [facility]      Patient Name: Peter Mejia  Medical Record Number: [id number]  Date of Service: May 16, 2018  Attending Psychologist: Colon Branch, PhD  Time Spent: 45 minutes of face-to-face counseling  CPT Procedure Code: 06237      Therapy Type: Behavior Modifying/Acceptance and Commitment Therapy (ACT)   Purpose of Treatment: reduce depression symptoms, reduce anxiety symptoms, improve quality of life, improve chronic pain coping      Subjective:   Mr.  Peter Mejia is a pleasant 67 y.o.  male who presents for cognitive behavioral and acceptance based therapy to address chronic pain and anxiety. He arrived on time and presented with anxious affect.  He described his mood as changes with the wind. He reports that his pain is fairly well controlled on current medication but that he continues to live life as a couch potato. Patient detailed increased intolerance and agitation when dealing with others and frustration with uncertainty about his medical diagnosis. Explored the workability of putting life on hold while he searches for an answer and identified SMART goals consistent with his values. Patient was receptive to this and at the same time remained fixed on his frustration with the uncertainty of his diagnosis and fears about his decline.      Objective / Mental Status Exam:  Appearance:    Appears stated age and Clean/Neat   Motor:   No abnormal movements   Speech/Language:    Pressured   Mood:    Anxious   Affect:   Full   Thought process:   Logical, linear, clear, coherent, goal directed   Thought content:    Denies SI, HI, self harm, delusions, obsessions, paranoid ideation, or ideas of reference   Perceptual disturbances:    Denies auditory and visual hallucinations, behavior not concerning for response to internal stimuli     Orientation:  Oriented to person, place, time, and general circumstances   Attention:   Able to fully attend without fluctuations in consciousness   Concentration:   Able to fully concentrate and attend   Memory:   Immediate, short-term, long-term, and recall grossly intact    Fund of knowledge:    Consistent with level of education and development   Insight:     Fair   Judgment:    Fair   Impulse Control:   Fair         Assessment:  Mr.  Peter Mejia  participated in this ACT session and exhibits fair motivation towards treatment goals. Although he expresses understanding about concepts and skills introduced, he maintains that he cannot engage in values guided behavior until there is better diagnostic clarification.     Mr.  Peter Mejia remains clinically anxious at this time with symptoms in the moderate range.     Improvement in anxiety symptoms can contribute to improved quality of life, functionality, and ability to cope with chronic pain and pain flares.     Focus on current treatment is values identification and values guided behavioral activation     Focus on future sessions will include cognitive defusion and pain willingness vs catastrophizing.    Adherence concerns: none.    Diagnostic Impression:   Anxiety      Plan:  Mr.  Peter Mejia will continue meeting with me for ACT, to address chronic pain and anxiety.     Mr.  Peter Mejia  will return when he is scheduled to return to clinic to see Dr.  Fayrene Fearing.      Mr.  Peter Mejia  was given instructions for values guided action, including noticing opportunities for values guided behavior.         Colon Branch, PhD  Clinical Pain Psychologist

## 2018-05-16 NOTE — Unmapped (Addendum)
Thank you for visiting the [facility] today.  It was nice to see you again.    Start senna once daily and colace 100 mg twice daily for constipation.   Start gabapentin 300 mg at night x 1 week, then 300 mg twice daily for 1 week, then 300 mg 3 times daily - consult your hepatologist regarding initiation of this medication  Continue tramadol up to 4 times daily  Contact me if you decide to undergo knee nerve injections for knee pain treatment  Follow up in 2 months    PLEASE BRING ALL YOUR PAIN MEDICATION PILLS IN THE ORIGINAL CONTAINERS TO EVERY CLINIC VISIT     Opiates (narcotics) are medicines used to relieve moderate to severe pain. They may be used for a short time for pain, such as after surgery. Or they may be used for long-term pain. They don't cure a health problem. But they help you manage the pain.  Opiates relieve pain by changing the way your body feels pain and the way you feel about pain. Opiates are powerful medicines. You may need to take extra steps to stay safe. Taking too much (overdose) of an opiate can cause death.     DO:    -Read the medication guide - ask you doctor if you have not received one.    -Take your medicine exactly as prescribed.    -Keep your medicine away from children and pets, or anyone who might steal or             misuse it. Store it in a safe and secure place. Also secure any unfilled    prescriptions.      -Flush unused medicine down the toilet.     DON'T:    -Share your medicine with others.  - Take any medicine unless it was prescribed for you.    -Stop taking your medicine without talking to your doctor.  - Break, chew, crush, dissolve, or inject your medicine. Do not cut or tear a patch.    -Drink alcohol or use illegal drugs while taking this medicine.    -Drive or operate machinery unless you feel entirely normal taking your medication (no drowsiness, foggy-headedness, or slowed reaction time).    -Take your medication in combination with a benzodiazepine medication (nerve pill       such as Valium, Xanax, Klonopin, Ativan) due to increased risk of overdose and       death.     Side effects  You may report side effects to the FDA at 1-800-FDA-1088.  Common side effects include:  ??         Constipation. It is important to treat this early and effectively - your doctor can help!  ??         Feeling dizzy or lightheaded. You may feel like you might faint.  ??         Feeling sleepy.  ??         Nausea or vomiting.  Other effects could include:  ??         Sexual difficulties or loss of interest.  ??         Dry mouth and dental problems.  ??         Mood changes.  ??         Cognitive impairment (not thinking or responding normally).     What to know about taking this medicine  ??  Your body gets used to opiates if you take them all the time. You could have withdrawal symptoms when you stop taking them. Symptoms include nausea, sweating, chills, diarrhea, and shaking. But you can avoid these symptoms if you slowly stop taking the medicine as your doctor tells you to.  ??         You have a small chance of addiction if you take opiates as prescribed. Your risk is a bit higher if you have abused drugs in the past.  ??         Some opiates have acetaminophen in them. Check the labels on all the other medicines you take. This includes over-the-counter drugs. Many medicines have acetaminophen. Do not take others with acetaminophen in them unless your doctor has told you to. Taking too much acetaminophen can be harmful. Talk to your doctor or pharmacist if you have questions about this.  ??         Be sure you know how to safely get rid of any leftover medicine. Talk to your doctor or pharmacist about how to do this. Ask for written instructions.     When should you call for help?  Call 911 anytime you think you may need emergency care. For example, call if:  ??         You have trouble breathing.  ??         You have swelling of your face, lips, tongue, or throat. ??         You have signs of an overdose. These include:  ??       Cold, clammy skin.  ??       Confusion.  ??       Severe nervousness or restlessness.  ??       Severe dizziness, drowsiness, or weakness.  ??       Slow breathing.  ??       Seizures.  Call your doctor now or seek immediate medical care if:  ??         You have hives.  Watch closely for changes in your health, and be sure to contact your doctor if:  ??         Your medicine is not helping with the pain.  ??         You are having side effects, such as constipation.     Where can you learn more?  Go to http://[facility]  Enter F734 in the search box to learn more about Learning About Opiates.  ?? 2006-2014 [employer] Inc[employer]. Care instructions adapted under license by [facility]. This care instruction is for use with your licensed healthcare professional. If you have questions about a medical condition or this instruction, always ask your healthcare professional. Healthwise, Incorporated disclaims any warranty or liability for your use of this information.  Content Version: 10.0.270728; Last Revised: Oct 26, 2011

## 2018-05-16 NOTE — Unmapped (Signed)
-----   Message from Jeneen Rinks, [place] sent at 05/16/2018 11:10 AM EST -----  Regarding: Referral  Pt called with information based On LUMBAR PUNCTURE, and referral.  Ordered: Lumbar Puncture   [facility]:#[phone]  Fax # For referral: [phone]    Pt stated he would like a confirmation when the referral is done.     I hope all information Is helpful for you Mrs Gordy Councilman.

## 2018-05-17 NOTE — Unmapped (Signed)
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 He understands he needs to return post therapy in Feb to have his labs drawn to get the final determination that he is cured.     He also talked about going to the pain clinic on Monday and they want him to take gabapentin. I notified Dawn of the taper up that is written in the chart.

## 2018-05-17 NOTE — Unmapped (Signed)
Patient returned your call.  He will wait to hear from you on Monday.      Phone: [phone]

## 2018-05-22 ENCOUNTER — Other Ambulatory Visit: Payer: Self-pay | Admitting: Neurology

## 2018-05-22 DIAGNOSIS — G959 Disease of spinal cord, unspecified: Secondary | ICD-10-CM

## 2018-05-22 NOTE — Unmapped (Signed)
Spoke with patient and fax form to [facility] per his request.

## 2018-05-22 NOTE — Unmapped (Signed)
Patient called to inquire about starting gabapentin in setting completing Epclusa. Advised ok to take gabapentin as instructed by his pain management specialists. Again, instructed to use naproxen sparingly. Pt verbalized understanding.

## 2018-05-22 NOTE — Unmapped (Signed)
Patient returned your call. States he will wait for a return call today, at your convenience.

## 2018-05-24 NOTE — Unmapped (Signed)
I spoke with [name] and he stated that he was prescribed Gabapentin by Dr Fayrene Fearing, he took his first tablet last pm and when he woke up this am he felt out of sorts,, his mental acuity was fuzzy, felt mildly dizzy, unsteady gait. He states he will not be taking anymore Gabapentin  until he has spoken with the Dr.    Please call Mr Peter Mejia at [phone]

## 2018-05-24 NOTE — Unmapped (Signed)
Mr Peter Mejia called and stated he was prescribed Tramadol by Dr Fayrene Fearing last week to be dispensed on 12/5.     [place] Dr's appointment today 12/4 and he wanted to know if we could instruct pharmacy to fill script today so that  He would not have to make another trip into town tomorrow. He is also scheduled to have a lumbar puncture on Friday and wanted to have script picked up in advance just in case he is not able to move around well on Friday.    Please advise

## 2018-05-25 NOTE — Unmapped (Signed)
Pt called to tell Alex that LP is scheduled for tomorrow 12/6 at 1100 at [facility].  There is a note from phlebotomist to complete blood work at time of procedure. Pt would like to ensure that everything is ordered as it should be.  Ph # to radiology dept is [phone]. Fax is [phone].

## 2018-05-25 NOTE — Unmapped (Addendum)
Lab and specimen requisitions faxed. Confirmation received. Left message for patient to call me back

## 2018-05-25 NOTE — Unmapped (Signed)
I attempted to call [facility] pharmacy: [facility] at [phone] to inform them that it was OK to fill script for Tramadol today 12/4 per MD multiple times today which resulted in phone ringing without answer and call being recycled to press 0 to speak with someone in the pharmacy.    I attempted to reach Mr Peter Mejia at [phone] to inform him of MD instructions and he did not answer. [name] had stated in previous message that he had an appointment today and would like to pick up medication today.     I spoke with Mr Peter Mejia this pm and informed him that I had attempted to reach his pharmacy multiple times today to inform them that his MD stated that it was [facility] to fill script for Tramadol today and had received no answer. Mr Peter Mejia stated that he did not keep his appointment in town today as he did not feel well s/p taking the Gabapentin and would not be going into town today anyway to pick up Tramadol.

## 2018-05-26 ENCOUNTER — Ambulatory Visit
Admission: RE | Admit: 2018-05-26 | Discharge: 2018-05-26 | Disposition: A | Discharge status: Home / Self Care | Payer: Medicare Other | Source: Ambulatory Visit | Attending: Neurology | Admitting: Neurology

## 2018-05-26 DIAGNOSIS — G959 Disease of spinal cord, unspecified: Secondary | ICD-10-CM | POA: Insufficient documentation

## 2018-05-26 HISTORY — DX: Inflammatory liver disease, unspecified: K75.9

## 2018-05-26 HISTORY — DX: Unspecified viral hepatitis C without hepatic coma: B19.20

## 2018-05-26 LAB — CBC
HCT: 42.9 % (ref 39.0–52.0)
Hemoglobin: 13.7 g/dL (ref 13.0–17.0)
MCH: 28 pg (ref 26.0–34.0)
MCHC: 31.9 g/dL (ref 30.0–36.0)
MCV: 87.6 fL (ref 80.0–100.0)
Platelets: 187 10*3/uL (ref 150–400)
RBC: 4.9 MIL/uL (ref 4.22–5.81)
RDW: 13 % (ref 11.5–15.5)
WBC: 6.6 10*3/uL (ref 4.0–10.5)
nRBC: 0 % (ref 0.0–0.2)

## 2018-05-26 LAB — PROTIME-INR
INR: 1.13
Prothrombin Time: 14.4 seconds (ref 11.4–15.2)

## 2018-05-26 LAB — CSF CELL COUNT WITH DIFFERENTIAL
Eosinophils, CSF: 1 %
Lymphs, CSF: 31 %
Monocyte-Macrophage-Spinal Fluid: 11 %
Other Cells, CSF: 2
RBC Count, CSF: 4023 /mm3 — ABNORMAL HIGH (ref 0–3)
Segmented Neutrophils-CSF: 55 %
WBC, CSF: 21 /mm3 (ref 0–5)

## 2018-05-26 LAB — APTT: aPTT: 34 seconds (ref 24–36)

## 2018-05-26 LAB — GLUCOSE, CSF: Glucose, CSF: 63 mg/dL (ref 40–70)

## 2018-05-26 LAB — CRYPTOCOCCAL ANTIGEN, CSF: Crypto Ag: NEGATIVE

## 2018-05-26 LAB — PATHOLOGIST SMEAR REVIEW

## 2018-05-26 LAB — PROTEIN, CSF: Total  Protein, CSF: 84 mg/dL — ABNORMAL HIGH (ref 15–45)

## 2018-05-26 MED ORDER — LIDOCAINE HCL (PF) 1 % IJ SOLN
2.0000 mL | Freq: Once | INTRAMUSCULAR | Status: AC
  Administered 2018-05-26: 2 mL
  Filled 2018-05-26: qty 2

## 2018-05-26 NOTE — Progress Notes (Signed)
Pt was informed by Dr. Register that he did not have sedationl therefore he could drive home. The doctor also informed him that he would rather him to have someone drive him but pt stated he would just drive himself. He stated he would be fine to drive himself home.

## 2018-05-26 NOTE — Progress Notes (Signed)
Gave D/C instructions concerning Lumbar puncture to the pt and he stated he understood the instructions and that he would comply.

## 2018-05-26 NOTE — Progress Notes (Signed)
Critical lab results from [place] (lab) of a Nucleated cell count of 21. Results are being called to [facility] at the [facility] Neu[facility] clinic at [phone].

## 2018-05-26 NOTE — Progress Notes (Signed)
Pt. Stable in RR.Back stable.D/C instructions given.F/U with pt's M.D.

## 2018-05-26 NOTE — Discharge Instructions (Signed)
Myelogram, Care After These instructions give you information about caring for yourself after your procedure. Your doctor may also give you more specific instructions. Call your doctor if you have any problems or questions after your procedure. Follow these instructions at home:  Drink enough fluid to keep your pee (urine) clear or pale yellow.  Rest as told by your doctor.  Lie flat with your head slightly raised (elevated).  Do not bend, lift, or do any hard activities for 24-48 hours or as told by your doctor.  Take over-the-counter and prescription medicines only as told by your doctor.  Take care of and remove your bandage (dressing) as told by your doctor.  Bathe or shower as told by your doctor. Contact a health care provider if:  You have a fever.  You have a headache that lasts longer than 24 hours.  You feel sick to your stomach (nauseous).  You throw up (vomit).  Your neck is stiff.  Your legs feel numb.  You cannot pee.  You cannot poop (have a bowel movement).  You have a rash.  You are itchy or sneezing. Get help right away if:  You have new symptoms or your symptoms get worse.  You have a seizure.  You have trouble breathing. This information is not intended to replace advice given to you by your health care provider. Make sure you discuss any questions you have with your health care provider. Document Released: 03/16/2008 Document Revised: 02/05/2016 Document Reviewed: 03/20/2015 Elsevier Interactive Patient Education  2018 [place]. Lumbar Puncture, Care After Refer to this sheet in the next few weeks. These instructions provide you with information on caring for yourself after your procedure. Your health care provider may also give you more specific instructions. Your treatment has been planned according to current medical practices, but problems sometimes occur. Call your health care provider if you have any problems or questions after your  procedure. What can I expect after the procedure? After your procedure, it is typical to have the following sensations:  Mild discomfort or pain at the insertion site.  Mild headache that is relieved with pain medicines.  Follow these instructions at home:   Avoid lifting anything heavier than 10 lb (4.5 kg) for at least 12 hours after the procedure.  Drink enough fluids to keep your urine clear or pale yellow. Contact a health care provider if:  You have fever or chills.  You have nausea or vomiting.  You have a headache that lasts for more than 2 days. Get help right away if:  You have any numbness or tingling in your legs.  You are unable to control your bowel or bladder.  You have bleeding or swelling in your back at the insertion site.  You are dizzy or faint. This information is not intended to replace advice given to you by your health care provider. Make sure you discuss any questions you have with your health care provider. Document Released: 06/12/2013 Document Revised: 11/13/2015 Document Reviewed: 02/13/2013 Elsevier Interactive Patient Education  2017 [place]   1) The drugs that you were given will stay in your system until tomorrow so for the next 24 hours you should not:  A) Drive an automobile B) Make any legal decisions C) Drink any alcoholic beverage   2) You may resume regular meals tomorrow.  Today it is better to start with liquids and gradually work up to solid foods.  You may eat  anything you prefer, but it is better to start with liquids, then soup and crackers, and gradually work up to solid foods.   3) Please notify your doctor immediately if you have any unusual bleeding, trouble breathing, redness and pain at the surgery site, drainage, fever, or pain not relieved by medication.    4) Additional Instructions:        Please contact your physician with any problems or Same Day  Surgery at [phone], Monday through Friday 6 am to 4 pm, or [place] at [facility] number at [phone].

## 2018-05-26 NOTE — Unmapped (Signed)
Patient wants Helmut Muster to know that the LP was completed. Everything went fine and he will schedule an appointment with Dr. Lanora Manis. Patient was transferred to the scheduler.

## 2018-05-26 NOTE — Unmapped (Signed)
Called  Peter Mejia this am, left a voice message asking him to call us in the clinic.

## 2018-05-26 NOTE — Unmapped (Signed)
I spoke with Mr Peter Mejia and informed him of the Md's response. He stated that he would like a physician to call him as he is wondering if his reaction to Gabapentin is a normal reaction and if he stuck with it he might feel or tolerate the medication better.     He is insisting he speak with a physician rather than choosing to decrease the medication dosage or schedule an appointment to see his provider per MD previous response.

## 2018-05-26 NOTE — Unmapped (Signed)
Patient called asking to speak to [facility]. Call forwarded as requested.

## 2018-05-27 NOTE — Unmapped (Signed)
Spoke with [name]. I reiterated that he should go to the nearest ED as directed by Dr. Lanora Manis for concern for CNS infection.

## 2018-05-27 NOTE — Unmapped (Signed)
I received a page as the resident on call. I attempted to call patient back at number provided, [phone] but no one picked up.

## 2018-05-27 NOTE — Unmapped (Signed)
[facility] called as patient had LP completed today and they have a critical lab value:    Nucleated cell count of 21    Provider paged and  is faxing lab results.

## 2018-05-27 NOTE — Unmapped (Signed)
Upon receiving the messages re: abnormal CSF results, specifically nucleated cell count 21 with RBC count 4000 (this is elevated WBC count) I have contacted the [facility] lab to confirm they have done all ordered viral tests and ask them to add bacterial and fungal tests.    [facility], phone [phone]: reports they have the CSF (they received 4 vials with 4 ml each) and they had orders for VDRL only (aparently, though I have ordered more and this is reflected in the orders under the Epic)  I have asked them to add: HHV-6, VZV, HIV, HSV, CMV, EBV and bacterial cultures, TB cultures and cryptococcal. They have informed me that I have to call [facility] for bacterial cultures.    I have called [facility], phone [phone] and I have asked them if they have the sample. They have reported that the bacterial cultures were sent out and they do not have CSF, but they'll try to add on orders. I have asked them to coordinate this with [facility], since they still have CFS. I have informed that I will need bacterial cultures, TB cultures and cryptococcal staining.   I have called [facility] again and confirmed that all orders are placed, including bacterial.    I called the patient, he was not available. I have left a message and informed him that his CSF was consistent with an infection and he needs to present to his nearest ER and informed the ER about this, so they can admit him and treat. His symptoms has been going on for quite some time and he did not exhibit signs and symptoms of infection, however, he has Hep C and potentially can be immunocompromised and exhibit no symptoms of CNS infection.

## 2018-05-27 NOTE — Unmapped (Signed)
May 27, 2018 9:42 AM    Received page to call x2 [phone] as Neurology resident on call.    [name] has additional information that he needs help with. He reports at [facility] Friday Dr. Lanora Mejia called him and left a voicemail telling him about CSF infection, to go to the hospital to be treated and get additional tests. Felt very disturbed that this had happened. Feels inconvenienced because it is the weekend and he needs to organize leaving home and having someone look after his dog. Feels as if he is in a vacuum.     The patient himself called [place] ED, and they told him they did not know about any of the tests Dr. Lanora Mejia had talked about. He also called [facility] [facility] lab this morning, and was told they had no orders, and that there's nothing we can do until there is some written orders from your doctor.     I explained that Dr. Lanora Mejia has called multiple labs to communicate the orders, but unfortunately Dr. Lanora Mejia ultimately does not have control over orders that are placed at a [facility] hospital. As she has communicated her recommendations to multiple labs, a ED physician at [facility] should be able to place those orders, furthermore she has already talked to the ED at [facility] about this. I apologized that some requests have been lost in communication and also apologized that we do not have control over this. I offered for him to come to [facility] ED, he explained that the whole point of his original LP getting done at [facility] was because it was closer to him and he didn't want to come to [facility].    I reiterated to him that infection cannot wait for weekends and that in the worse case scenario, if he were to have bacterial meningitis, he could die within 24 hours. I empathized that it is indeed inconvenient to have this news on a weekend, and agreed with him that he has the freedom of choice to due whatever we would like, however I reiterated that now 3 doctors have communicated their professional opinion that he go to the ED to have further workup for infection and be treated, regardless of what orders have been communicated or not. He asked me if I knew if he were going to be admitted or how he was going to be treated. I communicated that I cannot guarantee what [facility] [place] will do, but that they should have his lab results and know what the appropriate workup is for CSF infection/possible meningitis.

## 2018-05-28 LAB — CMV DNA BY PCR, QUALITATIVE: CMV DNA, Qual PCR: NEGATIVE

## 2018-05-29 LAB — PROTEIN ELECTROPHORESIS, SERUM
A/G Ratio: 1 (ref 0.7–1.7)
Albumin ELP: 3.5 g/dL (ref 2.9–4.4)
Alpha-1-Globulin: 0.3 g/dL (ref 0.0–0.4)
Alpha-2-Globulin: 0.9 g/dL (ref 0.4–1.0)
Beta Globulin: 0.9 g/dL (ref 0.7–1.3)
Gamma Globulin: 1.3 g/dL (ref 0.4–1.8)
Globulin, Total: 3.4 g/dL (ref 2.2–3.9)
Total Protein ELP: 6.9 g/dL (ref 6.0–8.5)

## 2018-05-29 LAB — HSV DNA BY PCR (REFERENCE LAB)
HSV 1 DNA: NEGATIVE
HSV 2 DNA: NEGATIVE

## 2018-05-29 LAB — IGG CSF INDEX
Albumin CSF-mCnc: 48 mg/dL (ref 11–48)
Albumin: 4.5 g/dL (ref 3.6–4.8)
CSF IgG Index: 0.7 (ref 0.0–0.7)
IgG (Immunoglobin G), Serum: 1316 mg/dL (ref 700–1600)
IgG, CSF: 9.8 mg/dL — ABNORMAL HIGH (ref 0.0–8.6)
IgG/Alb Ratio, CSF: 0.2 (ref 0.00–0.25)

## 2018-05-29 LAB — OLIGOCLONAL BANDS, CSF + SERM

## 2018-05-29 LAB — VDRL, CSF: VDRL Quant, CSF: NONREACTIVE

## 2018-05-29 NOTE — Unmapped (Signed)
Patient called to tell Helmut Muster. Call was transferred as requested.

## 2018-05-29 NOTE — Unmapped (Signed)
Pt states the message is urgent: Per Mr.[place]ka exact words: Calling per Dr.Mehrabyan message left Friday evening at 7 pm. He needs additional information immediately. He is requesting a call back with in the hour. There was not a guarantee made on this.      Cal back - [phone]

## 2018-05-30 LAB — CSF CULTURE: Gram Stain: NONE SEEN

## 2018-05-30 LAB — IMMUNOFIXATION ELECTROPHORESIS
IgA: 202 mg/dL (ref 61–437)
IgG (Immunoglobin G), Serum: 1380 mg/dL (ref 700–1600)
IgM (Immunoglobulin M), Srm: 161 mg/dL (ref 20–172)
Total Protein ELP: 6.9 g/dL (ref 6.0–8.5)

## 2018-05-30 LAB — CSF CULTURE W GRAM STAIN: Culture: NO GROWTH

## 2018-05-30 LAB — EPSTEIN BARR VRS(EBV DNA BY PCR)
EBV DNA QN by PCR: NEGATIVE copies/mL
log10 EBV DNA Qn PCR: UNDETERMINED log10 copy/mL

## 2018-05-30 LAB — MISC LABCORP TEST (SEND OUT): Labcorp test code: 138479

## 2018-05-30 LAB — OLIGOCLONAL BANDS, CSF + SERM

## 2018-05-30 NOTE — Unmapped (Signed)
Pt stated that he is back home from hospital, as of 330pm, hospitalitis spoke with she has stated that emphatically, categorically, unequivocally there is no infection in the spine?? He wants you to call for further instruction.

## 2018-05-30 NOTE — Unmapped (Signed)
Patient called and wanted to let you know he received all messages from you and Dr. Lanora Manis. Patient would like to thank you very much. Has appointment on 06/02/2018, asking if he still needs to be seen at this appointment or if this can be deferred. Please call at your convenience. States this is not urgent. Thank you!

## 2018-06-02 ENCOUNTER — Encounter
Admit: 2018-06-02 | Discharge: 2018-06-03 | Payer: MEDICARE | Attending: Student in an Organized Health Care Education/Training Program | Primary: Student in an Organized Health Care Education/Training Program

## 2018-06-02 DIAGNOSIS — G959 Disease of spinal cord, unspecified: Principal | ICD-10-CM

## 2018-06-02 NOTE — Unmapped (Signed)
[facility]  [facility] [place] [facility] [place]  [address]  [place] [address]  [phone]  Division of Neuromuscular disorders  Hilbert Bible, MD  Clinical Assistant Professor    Date: June 02, 2018  Patient Name: Peter Mejia  MRN: [id number]  PCP: Koleen Nimrod A Mancheno Revelo  Preston Fleeting*    Peter Mejia is a 67 y.o. Caucasian right handed male seen at the [place] [facility] for f/u with a concern for MND.  The following is the information obtained from the patient, from his electronic medical records from [facility] and well as from results on his CSF studies (under the media) from [place] [facility].    Assessment:      Peter Mejia is a 68 y.o. male who  has a past medical history of Back pain, HCV (hepatitis C virus), Hepatitis C, and Osteoarthritis. presenting in consultation for evaluation of MND.      The DDx should include autoimmune/inflammaotry myelopathy:  He had following evaluations since his last clinic visit:  ANA/ENA: 1:320/ equivocal for RNP-Abs  Immunofixation: The SPE pattern demonstrates an irregularity in the gamma region, which may represent monoclonal protein. Comment: Monoclonal component typed as IgM Lambda.   Concentration of monoclonal protein is too low to accurately quantify.  Cryoglobulin, B12 , Copper, Vit E, paraneoplastic panel - negative/normal     Plan:      Myelopathy:  MRI spine was negative  F/u on the CSF results:  basic: cell count, protein and glucose; CMV, EBV, HHV-6, oligoclonal bands, VDRL  May consider MRI brain and repeat MRI of the spine (C and T, given the symptoms of myelopathy).    The patient does not have symptoms consistent with NM pathology. I think it is more reasonable for him to f/u with general neurology clinic. I have sent a referral and discussed his case with Dr. Lia Foyer.       More than 50 % of today's 40 minute visit was dedicated to face-to-face counseling on test results, diagnosis, treatment and prognosis.    Subjective:      Interval history: LP done in [place] on 05/26/18. Initially though to have CSF infection, but ultimately did not have infection (culture negative). Denies recent fevers. Please see telephone encounters in Epic for the details on f/u of his CSF.  In summary: he had a traumatic tap, but his adjusted WBC count is still high compare to the RBC count. I have contacted the lab at the [facility] and asked then to run several viral tests including HHV-6.    Pain remains the same. Rib pain, back pain, knee pain. Patient taking tramadol 4x daily and this seems to help the pain quite a bit. Also takes one dose of tylenol daily. Patient believes he currently has a broken rib on the left side since 04/25/18. Pain is worse in the morning and subsides slightly throughout the day. Mainly having neck pain through lumbar spine pain, legs have been better lately. He compares his stiffness to the Tin Man from Wizard of Oz as he first gets up in the morning. He has quite severe restrictions of his ambulation because of aforementioned symptoms. He can not walk much, but does ambulate around home alone with dog. Has difficulty getting in and out of the shower, cannot walk down to end of driveway to get the mail. He does still drive but feels like his reaction time is getting a little  slower.     HPI:  Initial symptoms started with arthritis in his knees in November- December 2018. 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 In December 2018 he started feeling lethargic: he didn't have energy and some pain in his liver area. He has been diagnosed with Hep C in 1978.  Then he started feeling that he is starting loosing muscle bulk and since March 2019 it became very difficult to walk, take stares, lifting his right arm.     [facility]              MYOPATHY QUESTIONNAIRE                PLEASE ANSWER QUESTIONS AS THEY PERTAIN TO THE LAST 1 WEEK FUNCTION     GRADE     SCORE     no changes compare to baseline mild dysfunction, 1-30 % less compare to baseline  moderate dysfunction, 30-60%  less compare to baseline  severe dysfunction, 60-90%  less compare to baseline  advanced stage- unable        0 1 2 3 4      Chewing  x             Swallowing, some, no episodes of chocking, but  He needs water sometimes to move the food     x           Holding head up  x             Raising arms up: combing hair, placing objects on the shelves: he has to think how to lift his arms up, he also has weakness and pain in his muscles and jopints        x       Lifting heavy objects, he can barely hold gallon of milk, but has to assist with the other hand, used to do 50 lbs      x         Carrying heavy objects      x         Hand grip  x             Dexterity: buttoning, writing, picking up small objects; takes longer than usual to button      x         Sitting from lying position, uses arms to leverage himself up        x       Standing form sitting position, very difficult uses both hands to push up      x         Maintaining standing position a few minutes, hangs onto grocery cart, back pain      x         Walking, 50 steps        x       Running, last time 3-4 years ago               Going up stairs, with difficulty 5-6 steps max, needs to pull on the handrail               Muscle pain, 8/10        x       Breathing while lying down/ sitting/ with physical activities  x                       Total score:  Other               Weight loss, over 15 lbs in 8 months      x         Skin changes, dry and flaky      x         Joint pain      x         Swelling, BL feet constant      x           Muscle pain: with movements, 8/10, no pain at rest (thinks this is due to tramadol)    Bowel: he has BM 2-3 days, this was daily before  bludder: frequent urination: since April 2019, both, during the day or at the night  Muscle cramps: no cramps, only big toe extension  Muscle twitching Muscle waisting   Memory: nothing severe  Behavioral changes: more anxiety  Lives alone  Numbness and tingling: sometimes in his right foot    I have personally reviewed his images:  MRIs: brain, C-spine, T-spine and L spine , personally reviewed:  - he has multilevel DDD with moderate spinal stenosis, no signal changes in the spine. He has numerous changes in the vertebrae, this was not commented on the read.     CK normal, cryoglobulin normal, AST and ALT elevated 97/74 from 7/19 and normal on 02/28/18, hepatitis B Abs/Ag- normal, TSH 1.634, positive Hep C, normal RF    B12 from 03/07/18: 839   TSH from 01/18/18: 1.634    Labs from 04/26/18:  - ANA  Antinuclear Antibodies (ANA) Negative PositiveAbnormal     ANA Pattern 1  Speckled    ANA Titer 1  1:160     - ENA panel:  ANTI-JO1 AB Negative  Negative    ANTI-RNP AB Negative EquivocalAbnormal     ANTI-SCL-70 AB Negative Negative    ANTI-SM AB Negative Negative    ANTI-SSA AB Negative Negative    ANTI-SSB AB Negative EquivocalAbnormal       - C3: 106  - C4 35.1  - IgG kappa free light chain: 2.85High    - urine tox negative    HCV PCR from 05/15/18: not detected      Past Medical Hx:    Past Medical History:   Diagnosis Date   ??? Back pain    ??? HCV (hepatitis C virus)    ??? Hepatitis C    ??? Osteoarthritis        Past Surgical Hx:    Past Surgical History:   Procedure Laterality Date   ??? KNEE ARTHROSCOPY Left 1982    meniscus   ??? TONSILLECTOMY         Social Hx:    Social History     Socioeconomic History   ??? Marital status: Single     Spouse name: Not on file   ??? Number of children: 0   ??? Years of education: Not on file   ??? Highest education level: Some college, no degree   Occupational History   ??? Not on file   Social Needs   ??? Financial resource strain: Not on file   ??? Food insecurity:     Worry: Not on file     Inability: Not on file   ??? Transportation needs:     Medical: Not on file     Non-medical: Not on file   Tobacco Use   ??? Smoking status: Never Smoker   ???  Smokeless tobacco: Never Used   Substance and Sexual Activity   ??? Alcohol use: Never     Frequency: Never   ??? Drug use: Not Currently     Types: Cocaine   ??? Sexual activity: Not Currently     Partners: Female   Lifestyle   ??? Physical activity:     Days per week: Not on file     Minutes per session: Not on file   ??? Stress: Not on file   Relationships   ??? Social connections:     Talks on phone: Not on file     Gets together: Not on file     Attends religious service: Not on file     Active member of club or organization: Not on file     Attends meetings of clubs or organizations: Not on file     Relationship status: Not on file   Other Topics Concern   ??? Exercise Not Asked   ??? Living Situation Not Asked   Social History Narrative   ??? Not on file       Family Hx:    Family History   Problem Relation Age of Onset   ??? Lymphoma Mother         non hodkins   ??? Heart attack Father        ALLERGIES:  No Known Allergies    CURRENT MEDICATIONS:  Prior to Admission medications    Medication Sig Start Date End Date Taking? Authorizing Provider   acetaminophen (TYLENOL) 100 mg/mL suspension Take 1,000 mg by mouth.    Historical Provider, MD   EPCLUSA 400-100 mg tablet TAKE 1 TABLET BY MOUTH ONCE DAILY 01/26/18 01/26/19  Calton Golds, MD   traMADol Janean Sark) 50 mg tablet Take 50 mg by mouth Four (4) times a day.  02/09/18   Historical Provider, MD       Review of Systems:  A 10-systems review was performed and, unless otherwise noted, declared negative by patient.     Objective:     There were no vitals filed for this visit.       Physical Exam:  GENERAL:NAD, awake, alert, well nourished   Eyes: no scleral injection, no tearing  ENT: Moist mucous membranes of the oral cavity.   Neck: Supple, no carotid bruits   Cardiovascular: RRR, w/o any murmurs, rub, or gallop.   Lungs: CTAB, no wheezes/crackles/rhonchi.   Skin: No rash, lesions, breakdown   Psychiatry: Mood and affect appropriate   Abdomen: soft, non tender, non distended, Extremeties: No cyanosis, clubbing or edema.     Neurological Examination:   Mental Status:   alert, oriented to person, place and time. Speech with no  dysarthria, able to name and repeat with no  difficulty.  He has difficulties with following up commands: he has to repeat the command and f/u on it very slowly, but eventually is abl;e to do.     Cranial Nerves:   II, III- Pupls are equal 3 mm and reactive to light b/l, visual fields are intact   III, IV, VI- extraocular movements are intact, No ptosis, no nystagmus.  V- sensation is intact on the face   VII- face symmetrical, no facial droop, normal facial movements with smile/grimace  VIII- Hearing grossly intact.  IX and X- symmetric palate contraction, normal gag bilaterally  XI- Full shoulder shrug bilaterally  XII- Tongue protrudes midline, full range of movements of the tongue    Motor Exam:  Normal bulk and tone, no abnormal spontaneous movements observed in the muscles, such as fasciculations, myokymia.   Patient did not have high arched feet, hammer toes, kyphoscoliosis.    5/5 neck flexion/extension  Eye closure: normal  Buccinator: normal  Jaw strength: normal    Muscle strength in the upper and lower extremities was assessed using MRC score (Right/Left). There was no pain with passive and active movements of the upper and lower extremities.  Arm Abduction (deltoids, supraspinati): 5/5   Arm Adduction: 5/5  Arm Flexion: 5/5    Arm Extension: 5/5   Wrist Extension: 5/5  Wrist Flexion: 5/5   Hand grip: 5/5   Long Finger Flexors: median innervated 5/5, ulnar innervated 5/5  Finger Extensors: 5/5   Interossei: 4/4  Hip Flexion: 4-/4-   Knee Flexion: 5/5  Knee Eextension: 5/5   Foot Dorsiflexion: 5/5   Foot Plantar flexion: 5/5   Big toe Dorsiflexion: 5/5   Big toe Plantar flexion: 5/5       Sensory UEs LEs    R L R L   Temperature WNL WNL WNL WNL   Vibration WNL WNL WNL WNL       Reflexes R L   Biceps +2, brisk +2   Triceps +2 +2   Patella +2 +2 Achilles +3 +1     Ankle clonus 2+  Negative Hoffmann's sign b/l     Coordination: FtN without dysmetria or ataxia. Foot tap is normal    Gait: small steps, wide base gait, unsteady in [place], unable to tandem and walk on toes and heels       Diagnostic Studies and Review of Records:      Please see in HPI

## 2018-06-02 NOTE — Unmapped (Addendum)
You were seen in the neuromuscular clinic for evaluation of weakness and pian.    Plan:  - lumbar puncture and CSF that was done locally, some of your test results are back and normal, but the white blood cell count is high in your CSF and I would like someone to follow up on your CSF and your clinical symptoms.  I have discussed this over the phone with yoru PCP, Dr. Ephraim Hamburger and we have decided that the best way would be to send a referral to the local neurologist, who can follow up on your CSF results that are pending and can see you in the clinic.    However, you have received a call from Dr. Margaretmary Eddy office (the local neurologist), who refused to see you.  I'll send a referral to our general neurology team and ask them to see you here.   I have informed you, in case we do not receive all the CSF test results we (general neurology) may need another LP, and you express your understanding.    There is no need to follow up with me. However, in case you do not hear from the Neurology clinic for f/u, please call me back.

## 2018-06-05 LAB — VARICELLA-ZOSTER BY PCR: Varicella-Zoster, PCR: NEGATIVE

## 2018-06-05 NOTE — Unmapped (Signed)
He is trying to have records from [facility] to clinic.  He would like Alex to let him know when the records are received.

## 2018-06-06 NOTE — Unmapped (Signed)
Patient would like for Dr. Lanora Manis to send referral to our general neurology, as he is not able to get an appointment with a local neurologist. Also, patient would like to know if he can make appointment without referral

## 2018-06-07 NOTE — Unmapped (Signed)
Left message for patient to call back to confirm appointment on 12/23 at 1120

## 2018-06-07 NOTE — Unmapped (Signed)
Pt called back in to confirm his appt on 12/23 at 120.

## 2018-06-07 NOTE — Unmapped (Signed)
[name] called clinic today and would like to speak with you today if possible. Pt did inquire about his referral to the general clinic and I explained to him that referral was created but we need to wait for provider to review before we can schedule.    Thanks,  Clemencia Helzer

## 2018-06-07 NOTE — Unmapped (Signed)
LM and recall 1 month.

## 2018-06-08 NOTE — Unmapped (Signed)
Patient is requesting a call about wanting detailed information and results from his LP.    Please call patient at [phone]

## 2018-06-08 NOTE — Unmapped (Signed)
11:20

## 2018-06-09 NOTE — Unmapped (Signed)
Pt scheduled 06/12/2018 @11am 

## 2018-06-09 NOTE — Unmapped (Signed)
Patient called again requesting lab results.     Please call patient [phone] to give results.

## 2018-06-10 NOTE — Unmapped (Signed)
Call to patient to let him know that Dr. Lanora Manis said all available tests were negative so far. Left a message on his voicemail.

## 2018-06-12 ENCOUNTER — Encounter: Admit: 2018-06-12 | Discharge: 2018-06-13 | Payer: MEDICARE

## 2018-06-12 DIAGNOSIS — G959 Disease of spinal cord, unspecified: Secondary | ICD-10-CM | POA: Insufficient documentation

## 2018-06-12 DIAGNOSIS — R9082 White matter disease, unspecified: Secondary | ICD-10-CM

## 2018-06-12 DIAGNOSIS — G629 Polyneuropathy, unspecified: Secondary | ICD-10-CM

## 2018-06-12 DIAGNOSIS — R29898 Other symptoms and signs involving the musculoskeletal system: Secondary | ICD-10-CM

## 2018-06-12 DIAGNOSIS — M4802 Spinal stenosis, cervical region: Secondary | ICD-10-CM

## 2018-06-12 DIAGNOSIS — B182 Chronic viral hepatitis C: Secondary | ICD-10-CM

## 2018-06-12 NOTE — Unmapped (Signed)
[facility]rology [facility] Summary       [facility] [address]  [facility] NEUROLOGY CLINIC [address] [place]  [address]  [place] [address]  [phone]    Date: 06/12/2018  Patient Name: Peter Mejia  MRN: [id number]  PCP: Wadie Lessen, FNP    1st visit date with [place]: 03/07/2018  Most recent [place] visit date: 06/02/2018           [name] is a 67 y.o. right handed male seen for follow up at the [place] [facility].      Assessment & Plan:     [name] is a 67 y.o. right handed male seen for the following problems:    1. Peripheral polyneuropathy  - risk factors include chronic Hep C, multiple meds for Hep C over the years  - we need to evaluate for typical reasons of neuropathy  B12, folic acid, Lyme, RPR, cryoglobulins, CK, fasting glucose  SPEP already checked (IFE normal)   -  Continuing Hep C treatment essential    2. Pleocytosis  - outside of the typical ratio for trauamtic tap  - has chronic Hep C and has had treatment recently  - I think this is a red herring and I don't think he has an infection or inflammatory disorder of CSF at this time    3. White Matter Disease  - at this time, I am considering the white matter disease on MRI incidental    4. Cervical Spinal Stenosis  - explains the hyper-reflexia on exam  - we will need to repeat imaging in one year    5. Weakness/deconditioning  - EMG normal  - the result of chronic Hep C, cirrhosis, chronic pain, neuropathy  - continue pain management  - should be as active as possible (suggest some physical therapy)    Will follow up in 3 months with Ms. Nada Libman.           Thank you very much for this consultation. Please let us know if any questions or concerns.     Sincerely,  Lysbeth Galas, MD     CC: Donnie Aho, MD              Subjective:   Subjective          HPI: Patient is a 67 y.o. male presents today for a follow up appointment. History of Hep C. He was fine, in his usual state of health, until one year, at which time he started to suffer a generalized malaise syndrome, flu like in nature, with weakness, pain on his joints and all over his body. He had Hep C treatment after the onset of these symptoms, he does not think his symptoms have improved since then. Currently, he is on the post-treatment protocol of his Hep C treatment. At that time a determination of remission or cure will be determined in February 2020.     Dr. Lanora Manis has performed an extensive workup (per her note from 04/26/2018)  ANA/ENA: 1:320/ equivocal for RNP-Abs  Immunofixation: The SPE pattern demonstrates an irregularity in the gamma region, which may represent monoclonal protein. Comment: Monoclonal component typed as IgM Lambda.   Concentration of monoclonal protein is too low to accurately quantify.  Cryoglobulin, B12 , Copper, Vit E, paraneoplastic panel - negative/normal    An LP was performed  at an outside facility:  Cell count - 21 WBC/4023 RBC - this is a 200/1 ratio - a traumatic trap only, we should expect between 8-10 WBC (400-500/1 ratio).   Protein - 85 (elevated)  Glucose - 63 (slightly elevated)  IgG Index normal   Oligoclonal bands normal      Per my review of imaging from 02/24/18 and 03/07/18:  MRI brain - shows some periventricular, posterior callosal and a couple of subcortical and juxtacortical white mater lesions (not smoker, no history of HTN or diabetes);  MRI C spine - shows some DJD at C4/5 and C6/7 with no cord impingement, moderate central canal stenosis  MRI T spine - no issues with spinal cord  CSF smear - no malignancy  CSF culture - no growth  VDRL - negative        Crypto negative  HHV-6 negative  CMV, EBC, HSV, VZV all negative  IFE normal      Rheumatology workup at [facility] was unrevealing (see note from Sept 3, 2019)    EMG on 03/07/2018 showed a low median motor amplitude, otherwise unremarkable exam with no neuropathic or myopathic findings.     Per Dr. Foy Guadalajara from [facility] service:  Hep C with cirrhosis, treated on epclusa          As of today, the symptoms that are bothering him are myalgias, joint pains, lack of energy, feels weak (generalized), feels fatigued, he feels he is losing muscle strength, he feels issues picking up items (I.e. a 10 pound box), he feels his balance and his walking are off.     History of clinical trials for Interferon and Ribavirin for Hep C in the 1980s.      Past Medical Hx, Family Hx, Social Hx, and Problem List has been reviewed in the [employer].      ALLERGIES:  No Known Allergies     CURRENT MEDICATIONS:  Current Outpatient Medications   Medication Sig Dispense Refill   ??? acetaminophen (TYLENOL) 500 MG tablet Take 500 mg by mouth Every six (6) hours.      ??? docusate sodium (COLACE) 100 MG capsule Take 1 capsule (100 mg total) by mouth Two (2) times a day. 60 each 2   ??? traMADol (ULTRAM) 50 mg tablet Take 1 tablet (50 mg total) by mouth Four (4) times a day. DNF: 05/25/18 120 each 1   ??? senna (SENNA) 8.6 mg tablet Take 1 tablet by mouth daily. 30 tablet 2     No current facility-administered medications for this visit.        REVIEW OF SYSTEMS:  Review of Systems   Constitutional: Positive for malaise/fatigue and weight loss. Negative for chills and fever.   HENT: Negative for hearing loss and tinnitus.    Eyes: Positive for blurred vision. Negative for double vision and photophobia.   Respiratory: Negative for cough and shortness of breath.    Cardiovascular: Negative for chest pain and palpitations.   Gastrointestinal: Negative for nausea and vomiting.   Genitourinary: Positive for frequency. Negative for dysuria and urgency.   Musculoskeletal: Positive for back pain, joint pain, myalgias and neck pain. Negative for falls.   Skin: Positive for itching. Negative for rash.   Neurological: Positive for dizziness, tingling, sensory change and weakness. Negative for tremors, speech change, seizures and loss of consciousness.   Endo/Heme/Allergies: Negative for polydipsia. Bruises/bleeds easily.           Objective:       Physical  Exam:  Blood pressure 146/81, pulse 65, height 177.8 cm (5' 10), weight 72.3 kg (159 lb 4.8 oz).       Physical Exam   Constitutional: He appears well-developed and well-nourished. No distress.   HENT:   Head: Normocephalic and atraumatic.   Mouth/Throat: No oropharyngeal exudate.   Eyes: Pupils are equal, round, and reactive to light. Conjunctivae and EOM are normal. Right eye exhibits no discharge. Left eye exhibits no discharge. No scleral icterus.   Neck: Normal range of motion. Neck supple.   Cardiovascular: Normal rate, regular rhythm and normal heart sounds. Exam reveals no gallop and no friction rub.   No murmur heard.  Pulmonary/Chest: Effort normal and breath sounds normal. No respiratory distress. He has no wheezes. He has no rales. He exhibits no tenderness.   Abdominal: Soft. There is no tenderness. There is no rebound and no guarding.   Musculoskeletal:         General: Edema present.      Comments: + 3 pitting edema bilaterally with obscuration of skin  Swelling of ankles   Neurological: He has a normal Romberg Test.   Reflex Scores:       Tricep reflexes are 2+ on the right side and 2+ on the left side.       Bicep reflexes are 2+ on the right side and 2+ on the left side.       Brachioradialis reflexes are 2+ on the right side and 2+ on the left side.       Patellar reflexes are 3+ on the right side and 3+ on the left side.       Achilles reflexes are 1+ on the right side and 1+ on the left side.  Skin: Skin is warm. No rash noted. He is not diaphoretic. There is erythema.   Psychiatric: His speech is normal.             Neurologic Exam     Mental Status   Oriented to person.   Oriented to place.   Attention: normal. Concentration: normal.   Speech: speech is normal   Normal comprehension.     Cranial Nerves     CN II   Left visual field deficit: none     CN III, IV, VI   Pupils are equal, round, and reactive to light.  Extraocular motions are normal.   Right pupil: Shape: regular. Reactivity: brisk. Consensual response: intact.   Left pupil: Shape: regular. Reactivity: brisk. Consensual response: intact.   CN III: no CN III palsy  CN VI: no CN VI palsy  Nystagmus: none   Diplopia: none  Ophthalmoparesis: none  Upgaze: normal  Downgaze: normal  Conjugate gaze: present    CN V   Right facial sensation deficit: none  Left facial sensation deficit: none    CN VII   Right facial weakness: none  Left facial weakness: none    CN VIII   Hearing: intact    CN IX, X   CN IX normal.   Palate: symmetric    CN XI   Right sternocleidomastoid strength: normal  Right trapezius strength: normal    CN XII   CN XII normal.   Tongue: not atrophic  Fasciculations: absent  Tongue deviation: none    Motor Exam   Muscle bulk: normal  Overall muscle tone: normal  Right arm tone: normal  Left arm tone: normal  Right leg tone: normal  Left leg tone: normal    Strength  Right neck flexion: 5/5  Left neck flexion: 5/5  Right neck extension: 5/5  Left neck extension: 5/5  Right deltoid: 5/5  Left deltoid: 5/5  Right biceps: 5/5  Left biceps: 5/5  Right triceps: 5/5  Left triceps: 5/5  Right wrist flexion: 5/5  Left wrist flexion: 5/5  Right wrist extension: 5/5  Left wrist extension: 5/5  Right interossei: 5/5  Left interossei: 5/5  Right abdominals: 5/5  Left abdominals: 5/5  Right iliopsoas: 5/5  Left iliopsoas: 5/5  Right quadriceps: 5/5  Left quadriceps: 5/5  Right hamstring: 5/5  Left hamstring: 5/5  Right glutei: 5/5  Left glutei: 5/5  Right anterior tibial: 5/5  Left anterior tibial: 5/5  Right posterior tibial: 5/5  Left posterior tibial: 5/5  Right peroneal: 5/5  Left peroneal: 5/5  Right gastroc: 5/5  Left gastroc: 5/5    Sensory Exam   Right arm light touch: normal  Left arm light touch: normal  Right leg light touch: decreased from knee  Left leg light touch: decreased from knee  Right arm vibration: normal  Left arm vibration: normal  Right leg vibration: decreased from knee  Left leg vibration: decreased from knee  Right arm pinprick: normal  Left arm pinprick: normal  Right leg pinprick: normal  Left leg pinprick: normal  HYPERSENSITIVE TO PINPRICK AT TOES     Gait, Coordination, and Reflexes     Gait  Gait: wide-based    Coordination   Romberg: negative    Tremor   Resting tremor: absent  Intention tremor: absent  Action tremor: absent    Reflexes   Right brachioradialis: 2+  Left brachioradialis: 2+  Right biceps: 2+  Left biceps: 2+  Right triceps: 2+  Left triceps: 2+  Right patellar: 3+  Left patellar: 3+  Right achilles: 1+  Left achilles: 1+Needs to push himself to get off from bed  Seems to be in pain when walking around

## 2018-06-13 NOTE — Unmapped (Signed)
Patient called to report that he was just seen by [facility] and was told that all of his symptoms are due to his liver disease. He is requesting a call from Dr. Foy Guadalajara or an appointment with Dr. Foy Guadalajara to discuss this finding.   Informed the patient that I will communicate this request to Dr. Foy Guadalajara.

## 2018-06-16 NOTE — Unmapped (Signed)
Pt called and requested labs to be faxed to [facility].  He was given the printouts to take to [facility] but did not want to have to wait for the labs to be entered.  He was advised that he would need to take the forms to the lab in order for blood work to be completed in a timely manner.

## 2018-06-19 NOTE — Unmapped (Signed)
Left VM for patient to call back regarding his previous message.

## 2018-06-19 NOTE — Unmapped (Signed)
Patient called back to return call to [facility].  Please call at [phone]

## 2018-06-19 NOTE — Unmapped (Signed)
Discussion that took Place with Dr Nelva Bush about  Symptoms that Pt is having might be related to [facility] , and Test results by [facility] MD need to be discussed, need clarification on what test results Dr Nelva Bush ordered.

## 2018-06-19 NOTE — Unmapped (Signed)
Greetings Peter Rhody, PA    Pt is calling to get clarification on last visit with MD Nelva Bush. Pt stated when you have free time if you could contact him.  [phone]

## 2018-06-19 NOTE — Unmapped (Signed)
Left VM for patient. Will try to call him again on Thursday.

## 2018-06-20 LAB — MISC LABCORP TEST (SEND OUT): Labcorp test code: 550410

## 2018-06-22 NOTE — Unmapped (Signed)
Spoke to patient regarding several things.    1) Discussed with patient that his care has been switched from Dr. Lanora Manis to [facility]  2) Patient was informed that his labwork from 12/6 LP was unremarkable  3) Discussed that his peripheral neuropathy may be due to Hep C and the medications he has taken for Hep C, but we are doing additional bloodwork to evaluate for other causes  4) We will add a Vitamin D level to his blood work and have the orders faxed to [facility] on [address] in [place], [place]  5) Patient was notified to fax [place] the results when he gets them    All his questions were answered and he voiced understanding

## 2018-06-22 NOTE — Unmapped (Signed)
Patient was returning your call. States he will be home the rest of the day so please call him at your convenience. [phone]

## 2018-07-10 LAB — ACID FAST CULTURE WITH REFLEXED SENSITIVITIES (MYCOBACTERIA): Acid Fast Culture: NEGATIVE

## 2018-07-18 ENCOUNTER — Encounter (INDEPENDENT_AMBULATORY_CARE_PROVIDER_SITE_OTHER): Payer: Medicare Other | Admitting: Vascular Surgery

## 2018-07-19 DIAGNOSIS — K766 Portal hypertension: Secondary | ICD-10-CM | POA: Insufficient documentation

## 2018-07-21 ENCOUNTER — Encounter (INDEPENDENT_AMBULATORY_CARE_PROVIDER_SITE_OTHER): Payer: Medicare Other | Admitting: Vascular Surgery

## 2018-07-25 ENCOUNTER — Ambulatory Visit: Admit: 2018-07-25 | Discharge: 2018-07-25 | Payer: MEDICARE

## 2018-07-25 ENCOUNTER — Encounter: Admit: 2018-07-25 | Discharge: 2018-07-26 | Payer: MEDICARE | Attending: Clinical | Primary: Clinical

## 2018-07-25 DIAGNOSIS — E569 Vitamin deficiency, unspecified: Secondary | ICD-10-CM

## 2018-07-25 DIAGNOSIS — G894 Chronic pain syndrome: Secondary | ICD-10-CM

## 2018-07-25 DIAGNOSIS — Z0189 Encounter for other specified special examinations: Secondary | ICD-10-CM

## 2018-07-25 DIAGNOSIS — G629 Polyneuropathy, unspecified: Secondary | ICD-10-CM

## 2018-07-25 DIAGNOSIS — Z79891 Long term (current) use of opiate analgesic: Secondary | ICD-10-CM

## 2018-07-25 DIAGNOSIS — G959 Disease of spinal cord, unspecified: Principal | ICD-10-CM

## 2018-07-25 DIAGNOSIS — E539 Vitamin B deficiency, unspecified: Secondary | ICD-10-CM

## 2018-07-25 DIAGNOSIS — M179 Osteoarthritis of knee, unspecified: Secondary | ICD-10-CM

## 2018-07-25 DIAGNOSIS — M47816 Spondylosis without myelopathy or radiculopathy, lumbar region: Principal | ICD-10-CM

## 2018-07-25 DIAGNOSIS — F419 Anxiety disorder, unspecified: Principal | ICD-10-CM

## 2018-07-25 MED ORDER — GABAPENTIN 100 MG CAPSULE
ORAL_CAPSULE | Freq: Two times a day (BID) | ORAL | 0 refills | 0.00000 days | Status: CP
Start: 2018-07-25 — End: 2019-01-23

## 2018-07-25 MED ORDER — TRAMADOL 50 MG TABLET
Freq: Four times a day (QID) | ORAL | 2 refills | 0 days | Status: CP
Start: 2018-07-25 — End: 2018-10-11

## 2018-07-28 ENCOUNTER — Encounter (INDEPENDENT_AMBULATORY_CARE_PROVIDER_SITE_OTHER): Payer: Self-pay | Admitting: Vascular Surgery

## 2018-07-28 ENCOUNTER — Ambulatory Visit (INDEPENDENT_AMBULATORY_CARE_PROVIDER_SITE_OTHER): Payer: Medicare Other | Admitting: Vascular Surgery

## 2018-07-28 VITALS — BP 122/79 | HR 70 | Resp 16 | Ht 70.25 in | Wt 162.6 lb

## 2018-07-28 DIAGNOSIS — R600 Localized edema: Secondary | ICD-10-CM

## 2018-07-28 DIAGNOSIS — M7989 Other specified soft tissue disorders: Secondary | ICD-10-CM

## 2018-07-28 DIAGNOSIS — B182 Chronic viral hepatitis C: Secondary | ICD-10-CM

## 2018-07-28 DIAGNOSIS — B1920 Unspecified viral hepatitis C without hepatic coma: Secondary | ICD-10-CM | POA: Insufficient documentation

## 2018-07-28 LAB — MISC LABCORP TEST (SEND OUT)
Labcorp test code: 138313
Labcorp test code: 138479

## 2018-07-28 NOTE — Assessment & Plan Note (Signed)

## 2018-07-28 NOTE — Progress Notes (Signed)
Patient ID: Devin Hale, male   DOB: April 19, 1951, 68 y.o.   MRN: [id number]  Chief Complaint  Patient presents with  . Establish Care    HPI Devin Hale is a 68 y.o. male.  I am asked to see the patient by Dr. Ladoris Gene for evaluation of leg swelling.  The patient reports a few weeks ago developing abrupt severe swelling in both legs.  He brings a picture today and the swelling was quite impressive.  There was no clear inciting event or causative factor that started the swelling.  There was no previous history of DVT or superficial thrombophlebitis to his knowledge.  He has been getting treatment for hepatitis C but otherwise has had no major issues or changes recently.  No open wounds or infection.  The swelling is significantly better but is still present today.  Both legs are swollen but the right leg is the more severely affected of the 2 legs.  He has never had anything nearly as significant of this.  He has had occasional mild swelling only previously.   Past Medical History:  Diagnosis Date  . Hepatitis   . Hepatitis C     Past Surgical History:  Procedure Laterality Date  . KNEE ARTHROSCOPY Left 1982  . TONSILLECTOMY      Family History  Problem Relation Age of Onset  . Non-Hodgkin's lymphoma Mother   . Heart attack Father   no bleeding or clotting disorders  Social History Social History   Tobacco Use  . Smoking status: Never Smoker  . Smokeless tobacco: Never Used  Substance Use Topics  . Alcohol use: No  . Drug use: Never    No Known Allergies  Current Outpatient Medications  Medication Sig Dispense Refill  . acetaminophen (TYLENOL) 500 MG tablet Take 500 mg by mouth every 6 (six) hours as needed.    . gabapentin (NEURONTIN) 100 MG capsule Take 100 mg by mouth 2 (two) times daily.    . traMADol (ULTRAM) 50 MG tablet Take 50 mg by mouth every 6 (six) hours as needed.     No current facility-administered medications for this visit.       REVIEW OF  SYSTEMS (Negative unless checked)  Constitutional: [] Weight loss  [] Fever  [] Chills Cardiac: [] Chest Hale   [] Chest pressure   [] Palpitations   [] Shortness of breath when laying flat   [] Shortness of breath at rest   [] Shortness of breath with exertion. Vascular:  [] Hale in legs with walking   [] Hale in legs at rest   [] Hale in legs when laying flat   [] Claudication   [] Hale in feet when walking  [] Hale in feet at rest  [] Hale in feet when laying flat   [] History of DVT   [] Phlebitis   [x] Swelling in legs   [x] Varicose veins   [] Non-healing ulcers Pulmonary:   [] Uses home oxygen   [] Productive cough   [] Hemoptysis   [] Wheeze  [] COPD   [] Asthma Neurologic:  [] Dizziness  [] Blackouts   [] Seizures   [] History of stroke   [] History of TIA  [] Aphasia   [] Temporary blindness   [] Dysphagia   [] Weakness or numbness in arms   [] Weakness or numbness in legs Musculoskeletal:  [] Arthritis   [] Joint swelling   [] Joint Hale   [] Low back Hale Hematologic:  [] Easy bruising  [] Easy bleeding   [] Hypercoagulable state   [] Anemic  [] Hepatitis Gastrointestinal:  [] Blood in stool   [] Vomiting blood  [] Gastroesophageal reflux/heartburn   [] Abdominal Hale X positive for  hepatitis Genitourinary:  [] Chronic kidney disease   [] Difficult urination  [] Frequent urination  [] Burning with urination   [] Hematuria Skin:  [] Rashes   [] Ulcers   [] Wounds Psychological:  [] History of anxiety   []  History of major depression.    Physical Exam BP 122/79 (BP Location: Right Arm, Patient Position: Sitting, Cuff Size: Normal)   Pulse 70   Resp 16   Ht 5' 10.25" (1.784 m)   Wt 162 lb 9.6 oz (73.8 kg)   BMI 23.16 kg/m  Gen:  WD/WN, NAD Head: [place]/AT, No temporalis wasting.  Ear/Nose/Throat: Hearing grossly intact, nares w/o erythema or drainage, oropharynx w/o Erythema/Exudate Eyes: Conjunctiva clear, sclera non-icteric  Neck: trachea midline.   Pulmonary:  Good air movement, respirations not labored, no use of accessory  muscles Cardiac: RRR, no JVD Vascular:  Vessel Right Left  Radial Palpable Palpable                          PT Not Palpable Trace Palpable  DP 1+ Palpable 1+ Palpable   Gastrointestinal: soft, non-tender/non-distended.  Musculoskeletal: M/S 5/5 throughout.  Extremities without ischemic changes.  No deformity or atrophy. 1-2+ RLE edema, 1+ LLE edema. Neurologic: Sensation grossly intact in extremities.  Symmetrical.  Speech is fluent. Motor exam as listed above. Psychiatric: Judgment intact, Mood & affect appropriate for pt's clinical situation. Dermatologic: No rashes or ulcers noted.  No cellulitis or open wounds.    Radiology No results found.  Labs Recent Results (from the past 2160 hour(s))  CBC     Status: None   Collection Time: 05/26/18 10:48 AM  Result Value Ref Range   WBC 6.6 4.0 - 10.5 K/uL   RBC 4.90 4.22 - 5.81 MIL/uL   Hemoglobin 13.7 13.0 - 17.0 g/dL   HCT 42.9 39.0 - 52.0 %   MCV 87.6 80.0 - 100.0 fL   MCH 28.0 26.0 - 34.0 pg   MCHC 31.9 30.0 - 36.0 g/dL   RDW 13.0 11.5 - 15.5 %   Platelets 187 150 - 400 K/uL   nRBC 0.0 0.0 - 0.2 %    Comment: Performed at [facility], [address] [place], [place] [address]  Protime-INR     Status: None   Collection Time: 05/26/18 10:48 AM  Result Value Ref Range   Prothrombin Time 14.4 11.4 - 15.2 seconds   INR 1.13     Comment: Performed at [facility], [address] [place], [place] [address]  APTT     Status: None   Collection Time: 05/26/18 10:48 AM  Result Value Ref Range   aPTT 34 24 - 36 seconds    Comment: Performed at [facility], [address] [place], [place] [address]  Protein electrophoresis, serum     Status: None   Collection Time: 05/26/18 10:48 AM  Result Value Ref Range   Total Protein ELP 6.9 6.0 - 8.5 g/dL   Albumin ELP 3.5 2.9 - 4.4 g/dL   Alpha-1-Globulin 0.3 0.0 - 0.4 g/dL   Alpha-2-Globulin 0.9 0.4 - 1.0 g/dL   Beta Globulin 0.9 0.7 - 1.3 g/dL    Gamma Globulin 1.3 0.4 - 1.8 g/dL   M-Spike, % Not Observed Not Observed g/dL   SPE Interp. Comment     Comment: (NOTE) The SPE pattern appears essentially unremarkable. Evidence of monoclonal protein is not apparent. Performed At: [facility] [place], [place] [id number] Rush Farmer MD OZ:[id number]  Comment Comment     Comment: (NOTE) Protein electrophoresis scan will follow via computer, mail, or courier delivery.    GLOBULIN, TOTAL 3.4 2.2 - 3.9 g/dL   A/G Ratio 1.0 0.7 - 1.7  Immunofixation electrophoresis     Status: None   Collection Time: 05/26/18 10:48 AM  Result Value Ref Range   Total Protein ELP 6.9 6.0 - 8.5 g/dL   IgG (Immunoglobin G), Serum 1,380 700 - 1,600 mg/dL   IgA 202 61 - 437 mg/dL   IgM (Immunoglobulin M), Srm 161 20 - 172 mg/dL    Comment: (NOTE) Performed At: [facility] [place], [place] [id number] Rush Farmer MD WG:[id number]    Immunofixation Result, Serum Comment     Comment: An apparent normal immunofixation pattern.  Acid Fast Culture with reflexed sensitivities     Status: None   Collection Time: 05/26/18  2:27 PM  Result Value Ref Range   Acid Fast Culture Negative     Comment: (NOTE) No acid fast bacilli isolated after 6 weeks. Performed At: [facility] [place], [place] [id number] Rush Farmer MD MW:[id number]    Source of Sample CSF     Comment: Performed at [facility], [address] [place], [place] [address]  IgG CSF index     Status: Abnormal   Collection Time: 05/26/18  2:29 PM  Result Value Ref Range   IgG, CSF 9.8 (H) 0.0 - 8.6 mg/dL   Albumin CSF-mCnc 48 11 - 48 mg/dL   IgG (Immunoglobin G), Serum 1,316 700 - 1,600 mg/dL   Albumin 4.5 3.6 - 4.8 g/dL   IgG/Alb Ratio, CSF 0.20 0.00 - 0.25   CSF IgG Index 0.7 0.0 - 0.7    Comment: (NOTE) Performed At: [facility] [place], [place] [id number] Rush Farmer  MD VZ:[id number]   Oligoclonal bands, CSF + serum     Status: None   Collection Time: 05/26/18  2:29 PM  Result Value Ref Range   CSF Oligoclonal Bands Comment     Comment: (NOTE) Zero (0) oligoclonal bands were observed in the CSF. Interpretation: Criteria for Positivity: Four (4) or more oligoclonal bands observed  only in the CSF have been shown to be most consistent with MS  using our method. [Fortini AS, Sanders EL, Weinshenker BG, and Katzmann JA: Cerebrospinal Fluid Oligoclonal Bands in the Diagnosis  of Multiple Sclerosis. Am J Clin Pathol 120(5):672-675, 2003]. Oligoclonal bands that are present only in the CSF have been associated with a variety of inflammatory brain diseases such as multiple sclerosis (MS), subacute encephalitis, neurosyphilis, etc.  Increased IgG in the CSF is not specific for MS, but is an indication  of chronic neural inflammation. Clinical correlation indicated. Approximately 2-3% of clinically confirmed MS patients show little or no evidence of oligoclonal bands in the CSF; however oligoclonal  bands may develop as the disease progresses. Oligoclonal Banding testing performed using Isoelectric Focusing ( IEF) and immunoblotting methodology. Performed At: [facility] [place], [place] [id number] Rush Farmer MD SA:[id number]   CSF cell count with differential collection tube #: [id number]     Status: Abnormal   Collection Time: 05/26/18  2:29 PM  Result Value Ref Range   Tube # [id number]    Color, CSF PINK (A) COLORLESS   Appearance, CSF HAZY (A) CLEAR   Supernatant CLEAR    RBC Count, CSF 4,023 (H) 0 - 3 /cu mm   WBC,  CSF 21 (HH) 0 - 5 /cu mm    Comment: CRITICAL RESULT CALLED TO, READ BACK BY AND VERIFIED WITH: PHYLLIS RIGGS AT 1549 05/26/18.PMF    Segmented Neutrophils-CSF 55 %   Lymphs, CSF 31 %   Monocyte-Macrophage-Spinal Fluid 11 %   Eosinophils, CSF 1 %   Other Cells, CSF 2     Comment: BASOPHILS Performed at  [facility], [address] [place], [place] [address]   VDRL, CSF     Status: None   Collection Time: 05/26/18  2:29 PM  Result Value Ref Range   VDRL Quant, CSF Non Reactive Non Rea:<1:1    Comment: (NOTE) Performed At: [facility] [address] [place], [place] [id number] Rush Farmer MD ZD:[id number]   Glucose, CSF     Status: None   Collection Time: 05/26/18  2:29 PM  Result Value Ref Range   Glucose, CSF 63 40 - 70 mg/dL    Comment: Performed at [facility], [address] [place], [place] [address]  Protein, CSF     Status: Abnormal   Collection Time: 05/26/18  2:29 PM  Result Value Ref Range   Total  Protein, CSF 84 (H) 15 - 45 mg/dL    Comment: Performed at [facility], [address] [place], [place] [address]  CSF culture     Status: None   Collection Time: 05/26/18  2:29 PM  Result Value Ref Range   Specimen Description      CSF Performed at [facility], [address], [place], [place] [address]    Special Requests      NONE Performed at [facility], [place], [place] [address]    Gram Stain NO WBC SEEN NO ORGANISMS SEEN     Culture      NO GROWTH 3 DAYS Performed at [facility], [place] [address], [place], [place] [address]    Report Status 05/30/2018 FINAL   Oligoclonal bands, CSF + serm     Status: None   Collection Time: 05/26/18  2:29 PM  Result Value Ref Range   CSF Oligoclonal Bands REFERT     Comment: (NOTE) Please refer to the following specimen for additional lab results.      [phone]. Interpretation: Criteria for Positivity: Four (4) or more oligoclonal bands observed  only in the CSF have been shown to be most consistent with MS  using our method. [Fortini AS, Sanders EL, Weinshenker BG, and Katzmann JA: Cerebrospinal Fluid Oligoclonal Bands in the Diagnosis  of Multiple Sclerosis. Am J Clin Pathol 120(5):672-675, 2003]. Oligoclonal bands that  are present only in the CSF have been associated with a variety of inflammatory brain diseases such as multiple sclerosis (MS), subacute encephalitis, neurosyphilis, etc.  Increased IgG in the CSF is not specific for MS, but is an indication  of chronic neural inflammation. Clinical correlation indicated. Approximately 2-3% of clinically confirmed MS patients show little or no evidence of oligoclonal bands in the CSF; however oligoclonal  bands may develop as the disease progresses. Oligoclonal Banding testing per formed using Isoelectric Focusing (IEF) and immunoblotting methodology. Performed At: [facility] [place], [place] [id number] Rush Farmer MD VV:[id number]   Pathologist smear review     Status: None   Collection Time: 05/26/18  2:29 PM  Result Value Ref Range   Path Review Cytospin of CSF reviewed.     Comment: Patient underwent LP for evaluation for weakness, additional  serology is pending. Numerous RBCs, possibly contamination from traumatic tap. Few white cells including neutrophil, lymphocyte, monocyte, eosinophil. Negative for malignancy. Reviewed by Dellia Nims Reuel Derby, M.D. Performed at [facility], [address] [place], [place] [address]   CMV dna by pcr, qualitative     Status: None   Collection Time: 05/26/18  2:29 PM  Result Value Ref Range   CMV DNA, Qual PCR Negative Negative    Comment: (NOTE) No Cytomegalovirus DNA Detected. This test was developed and its performance characteristics determined by [facility].  It has not been cleared or approved by the Food and Drug Administration.  The FDA has determined that such clearance or approval is not necessary. Performed At: [facility] [place], [place] [id number] Rush Farmer MD WE:[id number]   Randell Patient vrs(ebv dna by pcr)     Status: None   Collection Time: 05/26/18  2:29 PM  Result Value Ref Range   EBV DNA QN by PCR Negative Negative  copies/mL    Comment: (NOTE) No EBV DNA detected. The quantitative range of this assay is 100 to 1 million copies/mL. This test was developed and its performance characteristics determined by [facility]. It has not been cleared or approved by the Food and Drug Administration. Performed At: [facility] [place], [place] [id number] Rush Farmer MD [id number]    log10 EBV DNA Qn PCR UNABLE TO CALCULATE log10 copy/mL    Comment: (NOTE) Unable to calculate result since non-numeric result obtained for component test.   Herpes simplex virus (HSV), DNA by PCR     Status: None   Collection Time: 05/26/18  2:29 PM  Result Value Ref Range   Specimen source hsv CSF     Comment: Performed at [facility], [address] [place], [place] [address]   HSV 1 DNA Negative Negative   HSV 2 DNA Negative Negative    Comment: (NOTE) This test was developed and its performance characteristics determined by [employer]. It has not been cleared or approved by the U.S. Food and Drug Administration. The FDA has determined that such clearance or approval is not necessary. This test is used for clinical purposes. It should not be regarded as investigational or research. Performed At: [facility] [place], [place] [id number] Rush Farmer MD XT:[id number]   Miscellaneous [facility] test (send-out)     Status: None   Collection Time: 05/26/18  2:29 PM  Result Value Ref Range   [facility] test code [id number]    [facility] test name HUMAN HERPES VIRUS 6     Comment: Performed at [facility] [place], [address] [place], [place] [address]   Superior result COMMENT     Comment: (NOTE) Test Ordered: [phone] Human Herpes Virus 6 PCR Human Herpes Virus 6 PCR       Negative                  BN     Reference Range: Negative                              No Human Herpes Virus Type 6 DNA detected. This test was developed and its performance  characteristics determined by [facility].  It has not been cleared or approved by the Food and Drug Administration.  The FDA has determined that such clearance or approval is not necessary. Performed At: [facility] [address]  [place], [place] [id number] Rush Farmer MD XB:[id number]   Cryptococcal antigen, CSF     Status: None   Collection Time: 05/26/18  2:29 PM  Result Value Ref Range   Crypto Ag NEGATIVE NEGATIVE   Cryptococcal Ag Titer NOT INDICATED NOT INDICATED  Varicella-zoster by PCR     Status: None   Collection Time: 05/26/18  2:29 PM  Result Value Ref Range   Varicella-Zoster, PCR Negative Negative    Comment: (NOTE) No Varicella Zoster Virus DNA detected. This test was developed and its performance characteristics determined by [facility].  It has not been cleared or approved by the Food and Drug Administration.  The FDA has determined that such clearance or approval is not necessary. Performed At: [facility] [place], [place] [id number] Rush Farmer MD NG:[id number]   Miscellaneous [facility] test (send-out)     Status: None   Collection Time: 05/26/18  2:30 PM  Result Value Ref Range   [facility] test code [phone]    [facility] test name Human Herpesvirus 6 HHV 6 DNA PCR    Misc [facility] result See Scanned report in [place]: Performed at [facility], [address] [place], [place] [address]  Miscellaneous [facility] test (send-out)     Status: None   Collection Time: 05/26/18  2:30 PM  Result Value Ref Range   [facility] test code [id number]    [facility] test name Varicella Zoster Virus VZV DNA PCR    Misc [facility] result See Scanned report in [place]: Performed at [facility], [address] [place], [place] [address]  Miscellaneous [facility] test (send-out)     Status: None   Collection Time: 05/26/18  2:30 PM  Result Value Ref Range   [facility] test code [phone]    [facility] test name  HIV1 RNA REALTIME PCR    Misc [facility] result See Scanned report in [place]: Performed at [facility], [address] [place], [place] [address]    Assessment/Plan:  Hepatitis C Although significant liver disease can definitely cause lower extremity swelling, it sounds like he is undergoing treatment and doing very well without major problems from his hepatitis C at this time.  Swelling of limb I have had a long discussion with the patient regarding swelling and why it  causes symptoms.  Patient will begin wearing graduated compression stockings class 1 (20-30 mmHg) on a daily basis a prescription was given. The patient will  beginning wearing the stockings first thing in the morning and removing them in the evening. The patient is instructed specifically not to sleep in the stockings.   In addition, behavioral modification will be initiated.  This will include frequent elevation, use of over the counter Hale medications and exercise such as walking.  I have reviewed systemic causes for chronic edema such as liver, kidney and cardiac etiologies.  The patient denies problems with these organ systems.    Consideration for a lymph pump will also be made based upon the effectiveness of conservative therapy.  This would help to improve the edema control and prevent sequela such as ulcers and infections   Patient should undergo duplex ultrasound of the venous system to ensure that DVT or reflux is not present.  The patient will follow-up with me after the ultrasound.        Devin Hale 07/28/2018, 2:24 PM   This note was  created with Dragon medical transcription system.  Any errors from dictation are unintentional.

## 2018-07-28 NOTE — Patient Instructions (Signed)

## 2018-07-28 NOTE — Assessment & Plan Note (Signed)
Although significant liver disease can definitely cause lower extremity swelling, it sounds like he is undergoing treatment and doing very well without major problems from his hepatitis C at this time.

## 2018-07-31 ENCOUNTER — Encounter: Admit: 2018-07-31 | Discharge: 2018-08-01 | Payer: MEDICARE

## 2018-07-31 DIAGNOSIS — B182 Chronic viral hepatitis C: Principal | ICD-10-CM

## 2018-07-31 DIAGNOSIS — K7460 Unspecified cirrhosis of liver: Secondary | ICD-10-CM

## 2018-08-01 ENCOUNTER — Other Ambulatory Visit: Payer: Self-pay

## 2018-08-01 ENCOUNTER — Encounter (INDEPENDENT_AMBULATORY_CARE_PROVIDER_SITE_OTHER): Payer: Self-pay | Admitting: Nurse Practitioner

## 2018-08-01 ENCOUNTER — Ambulatory Visit (INDEPENDENT_AMBULATORY_CARE_PROVIDER_SITE_OTHER): Payer: Medicare Other | Admitting: Nurse Practitioner

## 2018-08-01 ENCOUNTER — Ambulatory Visit (INDEPENDENT_AMBULATORY_CARE_PROVIDER_SITE_OTHER): Payer: Medicare Other

## 2018-08-01 VITALS — BP 124/77 | HR 66 | Resp 10 | Ht 70.0 in | Wt 162.0 lb

## 2018-08-01 DIAGNOSIS — M170 Bilateral primary osteoarthritis of knee: Secondary | ICD-10-CM

## 2018-08-01 DIAGNOSIS — R600 Localized edema: Secondary | ICD-10-CM

## 2018-08-01 DIAGNOSIS — I872 Venous insufficiency (chronic) (peripheral): Secondary | ICD-10-CM

## 2018-08-01 DIAGNOSIS — M7989 Other specified soft tissue disorders: Secondary | ICD-10-CM

## 2018-08-04 ENCOUNTER — Telehealth (INDEPENDENT_AMBULATORY_CARE_PROVIDER_SITE_OTHER): Payer: Self-pay

## 2018-08-04 NOTE — Telephone Encounter (Signed)
1.  Enlarged lymph nodes do not cause cancer.  It can be a sign of cancer however the most common cause of enlarged lymph nodes is infection, which will include something as simple as athlete's foot.  Also, in some instances lymph nodes can be chronically swollen due to old infection such as UTIs.  In some instances they may not mean anything at all.  If he is concerned, he should contact his primary care provider as he can do a full physical examination as well as lab work to determine if this is something small or if he does need to be referred to another specialist in regards to the issue. 2.  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  These are not cancerous.  These are generally not biopsied.  Typically, these are left alone unless they cause a person issues with walking or if they rupture they can be painful but otherwise these are left alone.  He does not need to see a specialist for this. 3.  He most definitely does not need a biopsy for his Baker's cyst, a biopsy would not be the first thing that needs to be done for lymph node, rather lab work and further examination of the cause of the lymph node. 4.  As far as measurement of his socks goes, he can attempt to do it himself lower the best place that I can recommend is a store in [place] as they fitting people for compression socks.  Unfortunately, as we are not fitting specialist I cannot indicate to you why the socks that you purchase do not fit.  Oftentimes the first time placing the socks on can be a challenge.  This is due to the very tight nature of the socks.  If the 20 to 30 mmHg socks are too challenging to place at first he can try the 15 to 20 mmHg to see if he is able to fit those better.

## 2018-08-04 NOTE — Telephone Encounter (Signed)
Spoke with patient and went over the message below from [place], [place]. He stated that he felt that he needed to have a conversation with her about this and I advised that is when you make an appointment. He stated that he understood all of the information but feels that Dr. 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 He is aware to call and make a physical appointment with his primary care to discuss labwork and examination of all the areas he is concerned about. The patient stated that he will reach out to [facility] [employer] since he measured his own leg at home before purchasing the compression socks he has now. He asked me if he measured correctly and I explained to him that it is not something done in this office so I am unsure of the process but to relay those questions for the medical supply store. Patient verbalized understanding and stated that he has no more questions at this time. He also wanted to thank Arna Medici, NP for taking the time to reply to his questions. Advised him to call back if anymore questions come up and to be sure to contact his primary care provider.

## 2018-08-04 NOTE — Telephone Encounter (Signed)
Patient called and left a message on the triage line and stated that he needed Arna Medici, NP to call him back that he had questions. I called the patient to get his questions and he did not understand why I returned his call and not Arna Medici since she was who he seen in the visit. Tried to explain to him that is not how it works she is seeing patients and when I get the questions from you to send to her she will let me know the answers and I will call you back. Finally he told me his questions that he has:  1. On his after visit summary it states that he had a enlarged lymph node in his groin area. He would like to know why it states that and he read that enlarged and plain lymph nodes can cause cancer. He is concerned that he should see another specialist in regards to that issue. 2. 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 He wants to know what that means and what he needs to do or if he needs to see a specialist for that. 3. Would like to know if he needs biopsys on the issues listed in number 1 and 2.  4. States he bought the Dr. Wallene Huh compression socks and followed the measurements on the pack of the package. He tried for "30 minutes and could not get them on" and he stated that he "has narrow feet and all of the socks are always loose" on him because they are so narrow. He does not understand why the socks did not fit and stated he will not "pay 100 dollars for a pair of socks to sit in a drawer."   Please advise

## 2018-08-09 ENCOUNTER — Ambulatory Visit (INDEPENDENT_AMBULATORY_CARE_PROVIDER_SITE_OTHER): Payer: Medicare Other | Admitting: Vascular Surgery

## 2018-08-09 ENCOUNTER — Encounter (INDEPENDENT_AMBULATORY_CARE_PROVIDER_SITE_OTHER): Payer: Medicare Other

## 2018-08-09 ENCOUNTER — Encounter (INDEPENDENT_AMBULATORY_CARE_PROVIDER_SITE_OTHER): Payer: Self-pay

## 2018-08-10 ENCOUNTER — Encounter: Admit: 2018-08-10 | Discharge: 2018-08-11 | Payer: MEDICARE | Attending: Family | Primary: Family

## 2018-08-10 DIAGNOSIS — Q766 Other congenital malformations of ribs: Principal | ICD-10-CM

## 2018-08-10 DIAGNOSIS — K7460 Unspecified cirrhosis of liver: Secondary | ICD-10-CM

## 2018-08-10 DIAGNOSIS — K769 Liver disease, unspecified: Secondary | ICD-10-CM

## 2018-08-10 DIAGNOSIS — R933 Abnormal findings on diagnostic imaging of other parts of digestive tract: Secondary | ICD-10-CM

## 2018-08-11 ENCOUNTER — Other Ambulatory Visit: Payer: Self-pay | Admitting: Family

## 2018-08-11 DIAGNOSIS — Q766 Other congenital malformations of ribs: Secondary | ICD-10-CM

## 2018-08-11 DIAGNOSIS — K769 Liver disease, unspecified: Secondary | ICD-10-CM

## 2018-08-11 DIAGNOSIS — R933 Abnormal findings on diagnostic imaging of other parts of digestive tract: Secondary | ICD-10-CM

## 2018-08-11 DIAGNOSIS — K7460 Unspecified cirrhosis of liver: Secondary | ICD-10-CM

## 2018-08-11 DIAGNOSIS — R932 Abnormal findings on diagnostic imaging of liver and biliary tract: Secondary | ICD-10-CM

## 2018-08-13 ENCOUNTER — Encounter (INDEPENDENT_AMBULATORY_CARE_PROVIDER_SITE_OTHER): Payer: Self-pay | Admitting: Nurse Practitioner

## 2018-08-13 DIAGNOSIS — I872 Venous insufficiency (chronic) (peripheral): Secondary | ICD-10-CM | POA: Insufficient documentation

## 2018-08-13 NOTE — Progress Notes (Signed)
SUBJECTIVE:  Patient ID: Devin Hale, male    DOB: [birth year], 68 y.o.   MRN: [id number] No chief complaint on file.   HPI  Devin Hale is a 68 y.o. male Patient is seen for evaluation of leg pain and swelling associated with new onset ulceration. The patient first noticed the swelling remotely. The swelling is associated with pain and discoloration. The pain and swelling worsens with prolonged dependency and improves with elevation. The pain is unrelated to activity.  The patient notes that in the morning the legs are better but the leg symptoms worsened throughout the course of the day. The patient has also noted a progressive worsening of the discoloration in the ankle and shin area.   The patient denies claudication symptoms or rest pain symptoms.  The patient denies DJD and LS spine disease.  The patient has not had any past angiography, interventions or vascular surgery.  Elevation makes the leg symptoms better, dependency makes them much worse. The patient denies any recent changes in medications.  The patient has not been wearing graduated compression.  The patient denies a history of DVT or PE. There is no prior history of phlebitis. There is no history of primary lymphedema.  No history of malignancies. No history of trauma or groin or pelvic surgery. There is no history of radiation treatment to the groin or pelvis       Past Medical History:  Diagnosis Date  . Hepatitis   . Hepatitis C     Past Surgical History:  Procedure Laterality Date  . KNEE ARTHROSCOPY Left 1982  . TONSILLECTOMY      Social History   Socioeconomic History  . Marital status: Single    Spouse name: Not on file  . Number of children: Not on file  . Years of education: Not on file  . Highest education level: Not on file  Occupational History  . Not on file  Social Needs  . Financial resource strain: Not on file  . Food insecurity:    Worry: Not on file    Inability: Not on file  .  Transportation needs:    Medical: Not on file    Non-medical: Not on file  Tobacco Use  . Smoking status: Never Smoker  . Smokeless tobacco: Never Used  Substance and Sexual Activity  . Alcohol use: No  . Drug use: Never  . Sexual activity: Not on file  Lifestyle  . Physical activity:    Days per week: Not on file    Minutes per session: Not on file  . Stress: Not on file  Relationships  . Social connections:    Talks on phone: Not on file    Gets together: Not on file    Attends religious service: Not on file    Active member of club or organization: Not on file    Attends meetings of clubs or organizations: Not on file    Relationship status: Not on file  . Intimate partner violence:    Fear of current or ex partner: Not on file    Emotionally abused: Not on file    Physically abused: Not on file    Forced sexual activity: Not on file  Other Topics Concern  . Not on file  Social History Narrative  . Not on file    Family History  Problem Relation Age of Onset  . Non-Hodgkin's lymphoma Mother   . Heart attack Father     No Known Allergies  Review of Systems   Review of Systems: Negative Unless Checked Constitutional: [] Weight loss  [] Fever  [] Chills Cardiac: [] Chest pain   []  Atrial Fibrillation  [] Palpitations   [] Shortness of breath when laying flat   [] Shortness of breath with exertion. [] Shortness of breath at rest Vascular:  [] Pain in legs with walking   [] Pain in legs with standing [] Pain in legs when laying flat   [] Claudication    [] Pain in feet when laying flat    [] History of DVT   [] Phlebitis   [x] Swelling in legs   [x] Varicose veins   [] Non-healing ulcers Pulmonary:   [] Uses home oxygen   [] Productive cough   [] Hemoptysis   [] Wheeze  [] COPD   [] Asthma Neurologic:  [] Dizziness   [] Seizures  [] Blackouts [] History of stroke   [] History of TIA  [] Aphasia   [] Temporary Blindness   [] Weakness or numbness in arm   [] Weakness or numbness in  leg Musculoskeletal:   [] Joint swelling   [] Joint pain   [] Low back pain  []  History of Knee Replacement [] Arthritis [] back Surgeries  []  Spinal Stenosis    Hematologic:  [x] Easy bruising  [x] Easy bleeding   [] Hypercoagulable state   [] Anemic Gastrointestinal:  [] Diarrhea   [] Vomiting  [] Gastroesophageal reflux/heartburn   [] Difficulty swallowing. [] Abdominal pain Genitourinary:  [] Chronic kidney disease   [] Difficult urination  [] Anuric   [] Blood in urine [] Frequent urination  [] Burning with urination   [] Hematuria Skin:  [] Rashes   [] Ulcers [] Wounds Psychological:  [] History of anxiety   []  History of major depression  []  Memory Difficulties      OBJECTIVE:   Physical Exam  BP 124/77 (BP Location: Left Arm, Patient Position: Sitting, Cuff Size: Small)   Pulse 66   Resp 10   Ht 5\' 10"  (1.778 m)   Wt 162 lb (73.5 kg)   BMI 23.24 kg/m   Gen: WD/WN, NAD Head: [place]/AT, No temporalis wasting.  Ear/Nose/Throat: Hearing grossly intact, nares w/o erythema or drainage Eyes: PER, EOMI, sclera nonicteric.  Neck: Supple, no masses.  No JVD.  Pulmonary:  Good air movement, no use of accessory muscles.  Cardiac: RRR Vascular: scattered varicosities present bilaterally.  Mild venous stasis changes to the legs bilaterally.  2+ soft pitting edema  Vessel Right Left  Radial Palpable Palpable  Dorsalis Pedis Palpable Palpable  Posterior Tibial Palpable Palpable   Gastrointestinal: soft, non-distended. No guarding/no peritoneal signs.  Musculoskeletal: M/S 5/5 throughout.  No deformity or atrophy.  Neurologic: Pain and light touch intact in extremities.  Symmetrical.  Speech is fluent. Motor exam as listed above. Psychiatric: Judgment intact, Mood & affect appropriate for pt's clinical situation. Dermatologic: No Venous rashes. No Ulcers Noted.  No changes consistent with cellulitis. Lymph : No Cervical lymphadenopathy, no lichenification or skin changes of chronic lymphedema.        ASSESSMENT AND PLAN:  1. Osteoarthritis of both knees, unspecified osteoarthritis type Continue NSAID medications as already ordered, these medications have been reviewed and there are no changes at this time.  Continued activity and therapy was stressed.   2. Chronic venous insufficiency  Recommend:  The patient is complaining of symptomatic varicose veins.  The patient describes them as painful, associated with swelling of both ankles and notes  this is interfering with daily activities and lifestyle.  I have had a long discussion with the patient regarding  varicose veins and why they cause symptoms.  Patient will begin wearing graduated compression stockings class 1 on a daily basis, beginning first thing in  the morning and removing them in the evening. The patient is instructed specifically not to sleep in the stockings.    The patient  will also begin using over-the-counter analgesics such as Motrin 600 mg po TID to help control the symptoms.    In addition, behavioral modification including elevation during the day will be initiated.  The patient is also instructed to continue exercising such as walking 4-5 times per week.  Pending the results of these changes the  patient will be reevaluated in three months at which time an  ultrasound of the venous system can be obtained if the symptoms are persistent.  Further plans will be based on the benefits of conservative therapy and the ultrasound results.  Current Outpatient Medications on File Prior to Visit  Medication Sig Dispense Refill  . acetaminophen (TYLENOL) 500 MG tablet Take 500 mg by mouth every 6 (six) hours as needed.    . traMADol (ULTRAM) 50 MG tablet Take 50 mg by mouth every 6 (six) hours as needed.    . gabapentin (NEURONTIN) 100 MG capsule Take 100 mg by mouth 2 (two) times daily.     No current facility-administered medications on file prior to visit.     There are no Patient Instructions on file for this  visit. No follow-ups on file.   Kris Hartmann, NP  This note was completed with Sales executive.  Any errors are purely unintentional.

## 2018-08-15 ENCOUNTER — Encounter
Admission: RE | Admit: 2018-08-15 | Discharge: 2018-08-15 | Disposition: A | Discharge status: Home / Self Care | Payer: Medicare Other | Source: Ambulatory Visit | Attending: Family | Admitting: Family

## 2018-08-15 DIAGNOSIS — Q766 Other congenital malformations of ribs: Secondary | ICD-10-CM | POA: Insufficient documentation

## 2018-08-15 DIAGNOSIS — K769 Liver disease, unspecified: Secondary | ICD-10-CM | POA: Diagnosis present

## 2018-08-15 DIAGNOSIS — R933 Abnormal findings on diagnostic imaging of other parts of digestive tract: Secondary | ICD-10-CM | POA: Diagnosis present

## 2018-08-15 MED ORDER — TECHNETIUM TC 99M MEDRONATE IV KIT
20.0000 | PACK | Freq: Once | INTRAVENOUS | Status: AC | PRN
  Administered 2018-08-15: 23.21 via INTRAVENOUS

## 2018-08-24 ENCOUNTER — Ambulatory Visit
Admission: RE | Admit: 2018-08-24 | Discharge: 2018-08-24 | Disposition: A | Discharge status: Home / Self Care | Payer: Medicare Other | Source: Ambulatory Visit | Attending: Family | Admitting: Family

## 2018-08-24 DIAGNOSIS — R932 Abnormal findings on diagnostic imaging of liver and biliary tract: Secondary | ICD-10-CM | POA: Diagnosis present

## 2018-08-24 DIAGNOSIS — M810 Age-related osteoporosis without current pathological fracture: Secondary | ICD-10-CM | POA: Insufficient documentation

## 2018-08-24 DIAGNOSIS — K7460 Unspecified cirrhosis of liver: Secondary | ICD-10-CM | POA: Diagnosis present

## 2018-09-18 ENCOUNTER — Other Ambulatory Visit: Payer: Self-pay | Admitting: Family

## 2018-09-18 DIAGNOSIS — K769 Liver disease, unspecified: Secondary | ICD-10-CM

## 2018-09-18 DIAGNOSIS — K7460 Unspecified cirrhosis of liver: Secondary | ICD-10-CM

## 2018-09-29 ENCOUNTER — Encounter: Admission: RE | Payer: Self-pay | Source: Home / Self Care

## 2018-09-29 ENCOUNTER — Ambulatory Visit
Admission: RE | Admit: 2018-09-29 | Payer: Medicare Other | Source: Home / Self Care | Admitting: Unknown Physician Specialty

## 2018-09-29 SURGERY — EGD (ESOPHAGOGASTRODUODENOSCOPY)
Anesthesia: General

## 2018-10-11 ENCOUNTER — Encounter: Admit: 2018-10-11 | Discharge: 2018-10-12 | Payer: MEDICARE

## 2018-10-11 ENCOUNTER — Encounter: Admit: 2018-10-11 | Discharge: 2018-10-12 | Payer: MEDICARE | Attending: Clinical | Primary: Clinical

## 2018-10-11 DIAGNOSIS — F419 Anxiety disorder, unspecified: Principal | ICD-10-CM

## 2018-10-24 ENCOUNTER — Encounter: Admit: 2018-10-24 | Discharge: 2018-10-25 | Payer: MEDICARE

## 2018-10-24 DIAGNOSIS — M5430 Sciatica, unspecified side: Principal | ICD-10-CM

## 2018-10-26 MED ORDER — TRAMADOL 50 MG TABLET
Freq: Four times a day (QID) | ORAL | 2 refills | 0.00000 days | Status: CP
Start: 2018-10-26 — End: 2018-10-30

## 2018-10-31 ENCOUNTER — Ambulatory Visit (INDEPENDENT_AMBULATORY_CARE_PROVIDER_SITE_OTHER): Payer: Medicare Other | Admitting: Vascular Surgery

## 2018-10-31 MED ORDER — TRAMADOL 50 MG TABLET
ORAL_TABLET | Freq: Four times a day (QID) | ORAL | 2 refills | 0.00000 days | Status: CP | PRN
Start: 2018-10-31 — End: 2019-02-06

## 2018-11-06 ENCOUNTER — Telehealth (INDEPENDENT_AMBULATORY_CARE_PROVIDER_SITE_OTHER): Payer: Self-pay | Admitting: Nurse Practitioner

## 2018-11-06 NOTE — Telephone Encounter (Signed)
Can you please cancel the patient apt for tomorrow at 1145am with Dew-per patient request. AS, CMA

## 2018-11-06 NOTE — Telephone Encounter (Signed)
Patient has an apt scheduled for tomorrow at 1145am with Devin Hale. Patient has called and said he has not been wearing the compression stockings as advised at his last apt and wondering if he should still be coming to apt tomorrow or if apt needs to be rescheduled.  Please advise. AS, CMA

## 2018-11-06 NOTE — Telephone Encounter (Signed)
Patient is aware of the below but has decided he would like to cancel the apt and call back after he has worn compression socks for 90days. He said he would call our office to reschedule his apt at his convince. AS, CMA

## 2018-11-06 NOTE — Telephone Encounter (Signed)
Yes he can still come in to speak with Dr. Lucky Cowboy

## 2018-11-07 ENCOUNTER — Ambulatory Visit (INDEPENDENT_AMBULATORY_CARE_PROVIDER_SITE_OTHER): Payer: Medicare Other | Admitting: Vascular Surgery

## 2018-11-07 ENCOUNTER — Ambulatory Visit: Payer: Medicare Other

## 2018-11-14 ENCOUNTER — Other Ambulatory Visit: Payer: Self-pay | Admitting: Sports Medicine

## 2018-11-14 ENCOUNTER — Other Ambulatory Visit: Payer: Self-pay | Admitting: Family

## 2018-11-14 DIAGNOSIS — G8929 Other chronic pain: Secondary | ICD-10-CM

## 2018-11-14 DIAGNOSIS — M419 Scoliosis, unspecified: Secondary | ICD-10-CM

## 2018-11-14 DIAGNOSIS — M47816 Spondylosis without myelopathy or radiculopathy, lumbar region: Secondary | ICD-10-CM

## 2018-11-20 ENCOUNTER — Ambulatory Visit: Admit: 2018-11-20 | Discharge: 2018-11-21 | Payer: MEDICARE

## 2018-11-20 ENCOUNTER — Encounter: Admit: 2018-11-20 | Discharge: 2018-11-21 | Payer: MEDICARE

## 2018-11-20 ENCOUNTER — Ambulatory Visit: Payer: Medicare Other

## 2018-11-20 ENCOUNTER — Ambulatory Visit: Admission: RE | Admit: 2018-11-20 | Payer: Medicare Other | Source: Ambulatory Visit

## 2018-11-20 DIAGNOSIS — K6289 Other specified diseases of anus and rectum: Secondary | ICD-10-CM

## 2018-11-20 DIAGNOSIS — K769 Liver disease, unspecified: Principal | ICD-10-CM

## 2018-11-20 DIAGNOSIS — M533 Sacrococcygeal disorders, not elsewhere classified: Secondary | ICD-10-CM

## 2018-11-20 DIAGNOSIS — M5441 Lumbago with sciatica, right side: Principal | ICD-10-CM

## 2018-11-20 DIAGNOSIS — K7460 Unspecified cirrhosis of liver: Secondary | ICD-10-CM

## 2018-11-20 DIAGNOSIS — M419 Scoliosis, unspecified: Secondary | ICD-10-CM

## 2018-11-20 DIAGNOSIS — M47816 Spondylosis without myelopathy or radiculopathy, lumbar region: Secondary | ICD-10-CM

## 2018-11-20 DIAGNOSIS — G8929 Other chronic pain: Secondary | ICD-10-CM

## 2018-12-03 MED ORDER — DICLOFENAC 1 % TOPICAL GEL
Freq: Four times a day (QID) | TOPICAL | 3 refills | 0 days | Status: CP
Start: 2018-12-03 — End: 2019-12-03

## 2019-01-02 ENCOUNTER — Encounter
Admission: RE | Admit: 2019-01-02 | Discharge: 2019-01-02 | Disposition: A | Discharge status: Home / Self Care | Payer: 59 | Source: Ambulatory Visit | Attending: Unknown Physician Specialty | Admitting: Unknown Physician Specialty

## 2019-01-11 ENCOUNTER — Encounter: Admit: 2019-01-11 | Discharge: 2019-01-11 | Payer: MEDICARE

## 2019-01-11 DIAGNOSIS — J339 Nasal polyp, unspecified: Principal | ICD-10-CM

## 2019-01-11 DIAGNOSIS — H903 Sensorineural hearing loss, bilateral: Secondary | ICD-10-CM

## 2019-01-11 DIAGNOSIS — H9313 Tinnitus, bilateral: Secondary | ICD-10-CM

## 2019-01-12 ENCOUNTER — Other Ambulatory Visit: Payer: 59

## 2019-01-15 ENCOUNTER — Other Ambulatory Visit: Payer: Medicaid Other

## 2019-01-15 DIAGNOSIS — S32030A Wedge compression fracture of third lumbar vertebra, initial encounter for closed fracture: Secondary | ICD-10-CM | POA: Insufficient documentation

## 2019-01-15 DIAGNOSIS — M810 Age-related osteoporosis without current pathological fracture: Secondary | ICD-10-CM | POA: Insufficient documentation

## 2019-01-17 ENCOUNTER — Encounter: Admission: RE | Payer: Self-pay | Source: Home / Self Care

## 2019-01-17 ENCOUNTER — Ambulatory Visit: Admission: RE | Admit: 2019-01-17 | Payer: 59 | Source: Home / Self Care | Admitting: Internal Medicine

## 2019-01-17 SURGERY — ESOPHAGOGASTRODUODENOSCOPY (EGD) WITH PROPOFOL
Anesthesia: General

## 2019-01-22 DIAGNOSIS — M159 Polyosteoarthritis, unspecified: Secondary | ICD-10-CM | POA: Insufficient documentation

## 2019-01-23 ENCOUNTER — Institutional Professional Consult (permissible substitution): Admit: 2019-01-23 | Discharge: 2019-01-24 | Payer: MEDICARE

## 2019-01-23 DIAGNOSIS — R531 Weakness: Secondary | ICD-10-CM

## 2019-01-23 DIAGNOSIS — R29898 Other symptoms and signs involving the musculoskeletal system: Secondary | ICD-10-CM

## 2019-01-23 DIAGNOSIS — G629 Polyneuropathy, unspecified: Secondary | ICD-10-CM

## 2019-01-23 DIAGNOSIS — R9082 White matter disease, unspecified: Principal | ICD-10-CM

## 2019-01-30 ENCOUNTER — Ambulatory Visit: Admit: 2019-01-30 | Discharge: 2019-01-31 | Payer: MEDICARE

## 2019-01-30 DIAGNOSIS — J310 Chronic rhinitis: Secondary | ICD-10-CM | POA: Insufficient documentation

## 2019-01-30 DIAGNOSIS — J339 Nasal polyp, unspecified: Secondary | ICD-10-CM | POA: Insufficient documentation

## 2019-01-30 DIAGNOSIS — H903 Sensorineural hearing loss, bilateral: Principal | ICD-10-CM

## 2019-02-05 ENCOUNTER — Encounter: Admit: 2019-02-05 | Discharge: 2019-02-06 | Payer: MEDICARE

## 2019-02-05 DIAGNOSIS — J310 Chronic rhinitis: Principal | ICD-10-CM

## 2019-02-05 DIAGNOSIS — J339 Nasal polyp, unspecified: Secondary | ICD-10-CM

## 2019-02-06 ENCOUNTER — Encounter: Admit: 2019-02-06 | Discharge: 2019-02-07 | Payer: MEDICARE

## 2019-02-06 DIAGNOSIS — M170 Bilateral primary osteoarthritis of knee: Secondary | ICD-10-CM

## 2019-02-06 DIAGNOSIS — M179 Osteoarthritis of knee, unspecified: Secondary | ICD-10-CM

## 2019-02-06 DIAGNOSIS — M4802 Spinal stenosis, cervical region: Secondary | ICD-10-CM

## 2019-02-06 DIAGNOSIS — M47816 Spondylosis without myelopathy or radiculopathy, lumbar region: Principal | ICD-10-CM

## 2019-02-06 DIAGNOSIS — G894 Chronic pain syndrome: Secondary | ICD-10-CM

## 2019-02-13 MED ORDER — TRAMADOL ER 200 MG TABLET,EXTENDED RELEASE 24 HR
ORAL_TABLET | Freq: Every day | ORAL | 0 refills | 30 days | Status: CP
Start: 2019-02-13 — End: ?

## 2019-02-13 MED ORDER — TRAMADOL 50 MG TABLET
ORAL_TABLET | Freq: Two times a day (BID) | ORAL | 0 refills | 30 days | Status: CP | PRN
Start: 2019-02-13 — End: ?

## 2019-02-19 ENCOUNTER — Encounter: Admit: 2019-02-19 | Discharge: 2019-02-20 | Payer: MEDICARE | Attending: Family | Primary: Family

## 2019-02-19 DIAGNOSIS — K7460 Unspecified cirrhosis of liver: Principal | ICD-10-CM

## 2019-02-19 DIAGNOSIS — K769 Liver disease, unspecified: Secondary | ICD-10-CM

## 2019-02-19 DIAGNOSIS — Z8619 Personal history of other infectious and parasitic diseases: Secondary | ICD-10-CM

## 2019-02-28 DIAGNOSIS — J339 Nasal polyp, unspecified: Secondary | ICD-10-CM

## 2019-02-28 DIAGNOSIS — B1920 Unspecified viral hepatitis C without hepatic coma: Secondary | ICD-10-CM

## 2019-02-28 DIAGNOSIS — H9190 Unspecified hearing loss, unspecified ear: Secondary | ICD-10-CM

## 2019-02-28 DIAGNOSIS — J310 Chronic rhinitis: Secondary | ICD-10-CM

## 2019-02-28 DIAGNOSIS — J343 Hypertrophy of nasal turbinates: Secondary | ICD-10-CM

## 2019-02-28 DIAGNOSIS — Z1159 Encounter for screening for other viral diseases: Secondary | ICD-10-CM

## 2019-02-28 DIAGNOSIS — M810 Age-related osteoporosis without current pathological fracture: Secondary | ICD-10-CM

## 2019-02-28 DIAGNOSIS — Z8739 Personal history of other diseases of the musculoskeletal system and connective tissue: Secondary | ICD-10-CM

## 2019-02-28 DIAGNOSIS — G960 Cerebrospinal fluid leak: Secondary | ICD-10-CM

## 2019-02-28 DIAGNOSIS — Z20828 Contact with and (suspected) exposure to other viral communicable diseases: Secondary | ICD-10-CM

## 2019-02-28 DIAGNOSIS — J329 Chronic sinusitis, unspecified: Secondary | ICD-10-CM

## 2019-03-01 DIAGNOSIS — J329 Chronic sinusitis, unspecified: Secondary | ICD-10-CM

## 2019-03-01 DIAGNOSIS — J310 Chronic rhinitis: Secondary | ICD-10-CM

## 2019-03-01 DIAGNOSIS — G960 Cerebrospinal fluid leak: Secondary | ICD-10-CM

## 2019-03-01 DIAGNOSIS — J343 Hypertrophy of nasal turbinates: Secondary | ICD-10-CM

## 2019-03-01 DIAGNOSIS — M810 Age-related osteoporosis without current pathological fracture: Secondary | ICD-10-CM

## 2019-03-01 DIAGNOSIS — B1920 Unspecified viral hepatitis C without hepatic coma: Secondary | ICD-10-CM

## 2019-03-01 DIAGNOSIS — H9190 Unspecified hearing loss, unspecified ear: Secondary | ICD-10-CM

## 2019-03-01 DIAGNOSIS — Z20828 Contact with and (suspected) exposure to other viral communicable diseases: Secondary | ICD-10-CM

## 2019-03-01 DIAGNOSIS — Z8739 Personal history of other diseases of the musculoskeletal system and connective tissue: Secondary | ICD-10-CM

## 2019-03-01 DIAGNOSIS — J339 Nasal polyp, unspecified: Secondary | ICD-10-CM

## 2019-03-02 ENCOUNTER — Ambulatory Visit
Admit: 2019-03-02 | Discharge: 2019-03-03 | Disposition: A | Discharge status: Home / Self Care | Payer: MEDICARE | Attending: Family

## 2019-03-02 ENCOUNTER — Encounter
Admit: 2019-03-02 | Discharge: 2019-03-03 | Disposition: A | Discharge status: Home / Self Care | Payer: MEDICARE | Attending: Student in an Organized Health Care Education/Training Program

## 2019-03-02 ENCOUNTER — Ambulatory Visit: Admit: 2019-03-02 | Discharge: 2019-03-03 | Disposition: A | Discharge status: Home / Self Care | Payer: MEDICARE

## 2019-03-02 DIAGNOSIS — G960 Cerebrospinal fluid leak: Secondary | ICD-10-CM

## 2019-03-02 DIAGNOSIS — Z8739 Personal history of other diseases of the musculoskeletal system and connective tissue: Secondary | ICD-10-CM

## 2019-03-02 DIAGNOSIS — B1920 Unspecified viral hepatitis C without hepatic coma: Secondary | ICD-10-CM

## 2019-03-02 DIAGNOSIS — J310 Chronic rhinitis: Secondary | ICD-10-CM

## 2019-03-02 DIAGNOSIS — J329 Chronic sinusitis, unspecified: Secondary | ICD-10-CM

## 2019-03-02 DIAGNOSIS — H9190 Unspecified hearing loss, unspecified ear: Secondary | ICD-10-CM

## 2019-03-02 DIAGNOSIS — Z20828 Contact with and (suspected) exposure to other viral communicable diseases: Secondary | ICD-10-CM

## 2019-03-02 DIAGNOSIS — M810 Age-related osteoporosis without current pathological fracture: Secondary | ICD-10-CM

## 2019-03-02 DIAGNOSIS — J343 Hypertrophy of nasal turbinates: Secondary | ICD-10-CM

## 2019-03-02 DIAGNOSIS — J339 Nasal polyp, unspecified: Secondary | ICD-10-CM

## 2019-03-03 MED ORDER — DOXYCYCLINE HYCLATE 100 MG TABLET
ORAL_TABLET | Freq: Two times a day (BID) | ORAL | 0 refills | 14.00 days | Status: CP
Start: 2019-03-03 — End: 2019-03-28
  Filled 2019-03-03: qty 28, 14d supply, fill #0

## 2019-03-03 MED ORDER — NEILMED NASAFLO PACKET WITH SINUS RINSE DEVICE
Freq: Two times a day (BID) | NASAL | PRN refills | 0.00 days | Status: CP
Start: 2019-03-03 — End: ?

## 2019-03-03 MED ORDER — ONDANSETRON 4 MG DISINTEGRATING TABLET
ORAL_TABLET | Freq: Three times a day (TID) | ORAL | 0 refills | 7.00 days | Status: CP | PRN
Start: 2019-03-03 — End: 2019-04-02
  Filled 2019-03-03: qty 20, 7d supply, fill #0

## 2019-03-03 MED ORDER — SENNOSIDES 8.6 MG TABLET
ORAL_TABLET | Freq: Every evening | ORAL | 0 refills | 30.00 days | Status: CP
Start: 2019-03-03 — End: 2019-04-02
  Filled 2019-03-03: qty 60, 30d supply, fill #0

## 2019-03-03 MED FILL — ONDANSETRON 4 MG DISINTEGRATING TABLET: 7 days supply | Qty: 20 | Fill #0 | Status: AC

## 2019-03-03 MED FILL — SENNA LAX 8.6 MG TABLET: 30 days supply | Qty: 60 | Fill #0 | Status: AC

## 2019-03-03 MED FILL — DOXYCYCLINE HYCLATE 100 MG TABLET: 14 days supply | Qty: 28 | Fill #0 | Status: AC

## 2019-03-07 ENCOUNTER — Ambulatory Visit: Admit: 2019-03-07 | Discharge: 2019-03-08 | Payer: MEDICARE

## 2019-03-07 DIAGNOSIS — J324 Chronic pansinusitis: Secondary | ICD-10-CM

## 2019-03-07 DIAGNOSIS — J339 Nasal polyp, unspecified: Secondary | ICD-10-CM

## 2019-03-07 DIAGNOSIS — J310 Chronic rhinitis: Secondary | ICD-10-CM

## 2019-03-07 MED ORDER — FLUTICASONE PROPIONATE 50 MCG/ACTUATION NASAL SPRAY,SUSPENSION
12 refills | 0 days | Status: CP
Start: 2019-03-07 — End: ?

## 2019-03-13 ENCOUNTER — Institutional Professional Consult (permissible substitution): Admit: 2019-03-13 | Discharge: 2019-03-14 | Payer: MEDICARE

## 2019-03-13 DIAGNOSIS — M170 Bilateral primary osteoarthritis of knee: Secondary | ICD-10-CM

## 2019-03-13 DIAGNOSIS — M4802 Spinal stenosis, cervical region: Secondary | ICD-10-CM

## 2019-03-13 DIAGNOSIS — G894 Chronic pain syndrome: Secondary | ICD-10-CM

## 2019-03-13 DIAGNOSIS — M47816 Spondylosis without myelopathy or radiculopathy, lumbar region: Secondary | ICD-10-CM

## 2019-03-13 DIAGNOSIS — M179 Osteoarthritis of knee, unspecified: Secondary | ICD-10-CM

## 2019-03-16 MED ORDER — TRAMADOL 50 MG TABLET
ORAL_TABLET | Freq: Two times a day (BID) | ORAL | 1 refills | 30 days | Status: CP | PRN
Start: 2019-03-16 — End: 2019-03-28

## 2019-03-16 MED ORDER — BUPRENORPHINE 5 MCG/HOUR WEEKLY TRANSDERMAL PATCH
TRANSDERMAL | 1 refills | 0 days | Status: CP
Start: 2019-03-16 — End: ?

## 2019-03-28 ENCOUNTER — Encounter: Admit: 2019-03-28 | Discharge: 2019-03-29 | Payer: MEDICARE

## 2019-03-28 DIAGNOSIS — J339 Nasal polyp, unspecified: Secondary | ICD-10-CM

## 2019-03-28 DIAGNOSIS — J310 Chronic rhinitis: Secondary | ICD-10-CM

## 2019-03-28 DIAGNOSIS — J324 Chronic pansinusitis: Secondary | ICD-10-CM

## 2019-03-30 ENCOUNTER — Encounter
Admit: 2019-03-30 | Discharge: 2019-03-31 | Payer: MEDICARE | Attending: Hematology & Oncology | Primary: Hematology & Oncology

## 2019-03-30 DIAGNOSIS — M6281 Muscle weakness (generalized): Secondary | ICD-10-CM

## 2019-04-10 ENCOUNTER — Encounter: Admit: 2019-04-10 | Discharge: 2019-04-11 | Payer: MEDICARE

## 2019-04-10 MED ORDER — BUPRENORPHINE 10 MCG/HOUR WEEKLY TRANSDERMAL PATCH: 1 | patch | 1 refills | 28 days | Status: AC

## 2019-04-11 ENCOUNTER — Ambulatory Visit: Payer: Self-pay | Admitting: Surgery

## 2019-04-11 NOTE — H&P (Signed)
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 male who was referred by Renato Shin Fitzgeral* for evaluation of above. First noted several months ago.    Here to discuss muscle biopsy per recs from Rheum   Past Medical History:  has a past medical history of Hepatic cirrhosis due to chronic hepatitis C infection (CMS-HCC), Hepatitis C, Muscular weakness (02/03/2018), Osteoporosis, Positive ANA (antinuclear antibody) (11/10/2017), and Tick fever.  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  Family History: family history includes Lymphoma in his mother; Myocardial Infarction (Heart attack) in his father; Other in his mother; Stroke in his maternal grandmother.  Social History:  reports that he has never smoked. He has never used smokeless tobacco. He reports previous alcohol use. He reports previous drug use.  Current Medications: has a current medication list which includes the following prescription(s): acetaminophen, alendronate, buprenorphine, ciclopirox, diclofenac, docusate, fluticasone propionate, and neilmed nasaflo.  Allergies:  No Known Allergies  ROS:  A 15 point review of systems was performed and pertinent positives and negatives noted in HPI   Objective:   BP 124/80   Pulse 73   Ht 177.8 cm (5\' 10" )   Wt 72.6 kg (160 lb)   BMI 22.96 kg/m   Constitutional :  alert, appears stated age, cooperative and no distress  Lymphatics/Throat:  no asymmetry, masses, or scars  Respiratory:  clear to auscultation bilaterally  Cardiovascular:  regular rate and rhythm  Gastrointestinal: soft, non-tender; bowel sounds normal; no masses,  no organomegaly.    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  Alternatives include continued observation.  Benefits include pathologic evaluation. Discussed the risk of surgery including chronic pain, post-op infxn, poor cosmesis, poor/delayed wound healing, and possible re-operation to address said risks. The risks of general anesthetic, if used, includes MI, CVA, sudden death or even reaction to anesthetic medications also discussed.  Typical post-op recovery time of 3-5 days with possible activity restrictions were also discussed.  The patient verbalized understanding and all questions were answered to the patient's satisfaction.  2. Patient has elected to proceed with surgical biopsy.  LEFT LEG. Procedure will be scheduled.  Written consent was obtained.     Electronically signed by Benjamine Sprague, DO on 04/11/2019 5:03 PM

## 2019-04-13 DIAGNOSIS — Z129 Encounter for screening for malignant neoplasm, site unspecified: Principal | ICD-10-CM

## 2019-04-13 DIAGNOSIS — K7460 Unspecified cirrhosis of liver: Principal | ICD-10-CM

## 2019-04-13 DIAGNOSIS — Z8619 Personal history of other infectious and parasitic diseases: Principal | ICD-10-CM

## 2019-04-17 DIAGNOSIS — K7460 Unspecified cirrhosis of liver: Principal | ICD-10-CM

## 2019-04-17 DIAGNOSIS — Z8619 Personal history of other infectious and parasitic diseases: Principal | ICD-10-CM

## 2019-04-19 ENCOUNTER — Other Ambulatory Visit: Admission: RE | Admit: 2019-04-19 | Payer: 59 | Source: Ambulatory Visit

## 2019-04-23 ENCOUNTER — Other Ambulatory Visit: Admission: RE | Admit: 2019-04-23 | Payer: 59 | Source: Ambulatory Visit

## 2019-04-25 ENCOUNTER — Telehealth (INDEPENDENT_AMBULATORY_CARE_PROVIDER_SITE_OTHER): Payer: Self-pay | Admitting: Vascular Surgery

## 2019-04-25 ENCOUNTER — Encounter (INDEPENDENT_AMBULATORY_CARE_PROVIDER_SITE_OTHER): Payer: Self-pay

## 2019-04-25 NOTE — Telephone Encounter (Signed)
Requested compression prescription has been faxed

## 2019-04-26 ENCOUNTER — Encounter: Admission: RE | Payer: Self-pay | Source: Home / Self Care

## 2019-04-26 ENCOUNTER — Telehealth (INDEPENDENT_AMBULATORY_CARE_PROVIDER_SITE_OTHER): Payer: Self-pay

## 2019-04-26 ENCOUNTER — Ambulatory Visit: Admission: RE | Admit: 2019-04-26 | Payer: 59 | Source: Home / Self Care | Admitting: Surgery

## 2019-04-26 SURGERY — MUSCLE BIOPSY
Anesthesia: General | Site: Thigh | Laterality: Left

## 2019-04-26 NOTE — Telephone Encounter (Signed)
Patient called stating he preferred the compression stocking prescription to faxed over to [facility] and the prescription has been faxed to requested medical supply store

## 2019-05-09 ENCOUNTER — Encounter: Admit: 2019-05-09 | Discharge: 2019-05-10 | Payer: MEDICARE

## 2019-05-10 ENCOUNTER — Encounter: Admit: 2019-05-10 | Discharge: 2019-05-10 | Payer: MEDICARE

## 2019-05-10 ENCOUNTER — Ambulatory Visit: Admit: 2019-05-10 | Discharge: 2019-05-10 | Payer: MEDICARE

## 2019-05-10 DIAGNOSIS — R531 Weakness: Principal | ICD-10-CM

## 2019-05-10 DIAGNOSIS — R768 Other specified abnormal immunological findings in serum: Principal | ICD-10-CM

## 2019-05-10 DIAGNOSIS — M1811 Unilateral primary osteoarthritis of first carpometacarpal joint, right hand: Principal | ICD-10-CM

## 2019-05-11 DIAGNOSIS — H903 Sensorineural hearing loss, bilateral: Principal | ICD-10-CM

## 2019-05-15 DIAGNOSIS — G959 Disease of spinal cord, unspecified: Principal | ICD-10-CM

## 2019-05-23 ENCOUNTER — Encounter: Admit: 2019-05-23 | Discharge: 2019-05-24 | Payer: MEDICARE

## 2019-05-23 DIAGNOSIS — G894 Chronic pain syndrome: Principal | ICD-10-CM

## 2019-05-23 DIAGNOSIS — M4802 Spinal stenosis, cervical region: Principal | ICD-10-CM

## 2019-05-23 DIAGNOSIS — M47816 Spondylosis without myelopathy or radiculopathy, lumbar region: Principal | ICD-10-CM

## 2019-05-29 DIAGNOSIS — M1811 Unilateral primary osteoarthritis of first carpometacarpal joint, right hand: Principal | ICD-10-CM

## 2019-05-31 ENCOUNTER — Encounter: Admit: 2019-05-31 | Discharge: 2019-06-01 | Payer: MEDICARE

## 2019-06-04 ENCOUNTER — Encounter: Admit: 2019-06-04 | Discharge: 2019-06-04 | Payer: MEDICARE

## 2019-06-05 DIAGNOSIS — K769 Liver disease, unspecified: Principal | ICD-10-CM

## 2019-06-05 DIAGNOSIS — Z8619 Personal history of other infectious and parasitic diseases: Principal | ICD-10-CM

## 2019-06-05 DIAGNOSIS — K7460 Unspecified cirrhosis of liver: Principal | ICD-10-CM

## 2019-06-12 MED ORDER — BUPRENORPHINE 10 MCG/HOUR WEEKLY TRANSDERMAL PATCH
MEDICATED_PATCH | TRANSDERMAL | 1 refills | 0.00000 days | Status: CP
Start: 2019-06-12 — End: ?

## 2019-06-27 ENCOUNTER — Encounter: Admit: 2019-06-27 | Discharge: 2019-06-28 | Payer: MEDICARE

## 2019-06-27 DIAGNOSIS — M47816 Spondylosis without myelopathy or radiculopathy, lumbar region: Principal | ICD-10-CM

## 2019-06-27 DIAGNOSIS — G894 Chronic pain syndrome: Principal | ICD-10-CM

## 2019-06-27 DIAGNOSIS — Z79891 Long term (current) use of opiate analgesic: Principal | ICD-10-CM

## 2019-06-27 DIAGNOSIS — M4802 Spinal stenosis, cervical region: Principal | ICD-10-CM

## 2019-06-27 DIAGNOSIS — M179 Osteoarthritis of knee, unspecified: Principal | ICD-10-CM

## 2019-06-27 MED ORDER — BUPRENORPHINE 10 MCG/HOUR WEEKLY TRANSDERMAL PATCH
MEDICATED_PATCH | TRANSDERMAL | 1 refills | 28 days | Status: CP
Start: 2019-06-27 — End: ?

## 2019-06-27 MED ORDER — PREGABALIN 25 MG CAPSULE
1 refills | 0 days | Status: CP
Start: 2019-06-27 — End: ?

## 2019-07-17 ENCOUNTER — Encounter: Admit: 2019-07-17 | Discharge: 2019-07-18 | Payer: MEDICARE

## 2019-07-19 ENCOUNTER — Encounter: Admit: 2019-07-19 | Discharge: 2019-07-20 | Payer: MEDICARE | Attending: Family | Primary: Family

## 2019-07-23 ENCOUNTER — Encounter: Admit: 2019-07-23 | Discharge: 2019-07-24 | Payer: MEDICARE | Attending: Family | Primary: Family

## 2019-08-13 ENCOUNTER — Other Ambulatory Visit: Payer: Self-pay

## 2019-08-13 ENCOUNTER — Inpatient Hospital Stay
Admission: EM | Admit: 2019-08-13 | Discharge: 2019-08-13 | DRG: 261 | Disposition: A | Discharge status: Other Acute Inpatient Hospital | Payer: 59 | Source: Ambulatory Visit | Attending: Internal Medicine | Admitting: Internal Medicine

## 2019-08-13 ENCOUNTER — Inpatient Hospital Stay: Admit: 2019-08-13 | Payer: 59

## 2019-08-13 ENCOUNTER — Emergency Department: Payer: 59

## 2019-08-13 ENCOUNTER — Encounter
Admission: EM | Disposition: A | Discharge status: Other Acute Inpatient Hospital | Payer: Self-pay | Source: Home / Self Care | Attending: Internal Medicine

## 2019-08-13 DIAGNOSIS — M1990 Unspecified osteoarthritis, unspecified site: Secondary | ICD-10-CM | POA: Diagnosis present

## 2019-08-13 DIAGNOSIS — I340 Nonrheumatic mitral (valve) insufficiency: Secondary | ICD-10-CM | POA: Diagnosis not present

## 2019-08-13 DIAGNOSIS — Z20822 Contact with and (suspected) exposure to covid-19: Secondary | ICD-10-CM | POA: Diagnosis present

## 2019-08-13 DIAGNOSIS — Z7983 Long term (current) use of bisphosphonates: Secondary | ICD-10-CM

## 2019-08-13 DIAGNOSIS — I872 Venous insufficiency (chronic) (peripheral): Secondary | ICD-10-CM | POA: Diagnosis present

## 2019-08-13 DIAGNOSIS — Z9089 Acquired absence of other organs: Secondary | ICD-10-CM | POA: Diagnosis not present

## 2019-08-13 DIAGNOSIS — Z79899 Other long term (current) drug therapy: Secondary | ICD-10-CM | POA: Diagnosis not present

## 2019-08-13 DIAGNOSIS — Z8249 Family history of ischemic heart disease and other diseases of the circulatory system: Secondary | ICD-10-CM

## 2019-08-13 DIAGNOSIS — I248 Other forms of acute ischemic heart disease: Secondary | ICD-10-CM | POA: Diagnosis present

## 2019-08-13 DIAGNOSIS — Z8619 Personal history of other infectious and parasitic diseases: Secondary | ICD-10-CM

## 2019-08-13 DIAGNOSIS — I442 Atrioventricular block, complete: Principal | ICD-10-CM | POA: Diagnosis present

## 2019-08-13 DIAGNOSIS — Z807 Family history of other malignant neoplasms of lymphoid, hematopoietic and related tissues: Secondary | ICD-10-CM

## 2019-08-13 DIAGNOSIS — K7460 Unspecified cirrhosis of liver: Secondary | ICD-10-CM | POA: Diagnosis present

## 2019-08-13 DIAGNOSIS — R9431 Abnormal electrocardiogram [ECG] [EKG]: Secondary | ICD-10-CM | POA: Diagnosis not present

## 2019-08-13 DIAGNOSIS — R001 Bradycardia, unspecified: Secondary | ICD-10-CM | POA: Diagnosis present

## 2019-08-13 HISTORY — PX: TEMPORARY PACEMAKER: CATH118268

## 2019-08-13 LAB — COMPREHENSIVE METABOLIC PANEL
ALT: 18 U/L (ref 0–44)
AST: 28 U/L (ref 15–41)
Albumin: 4 g/dL (ref 3.5–5.0)
Alkaline Phosphatase: 67 U/L (ref 38–126)
Anion gap: 6 (ref 5–15)
BUN: 20 mg/dL (ref 8–23)
CO2: 26 mmol/L (ref 22–32)
Calcium: 9.7 mg/dL (ref 8.9–10.3)
Chloride: 107 mmol/L (ref 98–111)
Creatinine, Ser: 1.09 mg/dL (ref 0.61–1.24)
GFR calc Af Amer: >60 mL/min (ref >60)
GFR calc non Af Amer: >60 mL/min (ref >60)
Glucose, Bld: 109 mg/dL — ABNORMAL HIGH (ref 70–99)
Potassium: 5 mmol/L (ref 3.5–5.1)
Sodium: 139 mmol/L (ref 135–145)
Total Bilirubin: 1.1 mg/dL (ref 0.3–1.2)
Total Protein: 7.6 g/dL (ref 6.5–8.1)

## 2019-08-13 LAB — BRAIN NATRIURETIC PEPTIDE: B Natriuretic Peptide: 707 pg/mL — ABNORMAL HIGH (ref 0.0–100.0)

## 2019-08-13 LAB — CBC WITH DIFFERENTIAL/PLATELET
Abs Immature Granulocytes: 0.02 10*3/uL (ref 0.00–0.07)
Basophils Absolute: 0 10*3/uL (ref 0.0–0.1)
Basophils Relative: 0 %
Eosinophils Absolute: 0.1 10*3/uL (ref 0.0–0.5)
Eosinophils Relative: 1 %
HCT: 44.3 % (ref 39.0–52.0)
Hemoglobin: 14.8 g/dL (ref 13.0–17.0)
Immature Granulocytes: 0 %
Lymphocytes Relative: 20 %
Lymphs Abs: 1.6 10*3/uL (ref 0.7–4.0)
MCH: 28.9 pg (ref 26.0–34.0)
MCHC: 33.4 g/dL (ref 30.0–36.0)
MCV: 86.5 fL (ref 80.0–100.0)
Monocytes Absolute: 0.6 10*3/uL (ref 0.1–1.0)
Monocytes Relative: 7 %
Neutro Abs: 5.8 10*3/uL (ref 1.7–7.7)
Neutrophils Relative %: 72 %
Platelets: 145 10*3/uL — ABNORMAL LOW (ref 150–400)
RBC: 5.12 MIL/uL (ref 4.22–5.81)
RDW: 13.2 % (ref 11.5–15.5)
WBC: 8.1 10*3/uL (ref 4.0–10.5)
nRBC: 0 % (ref 0.0–0.2)

## 2019-08-13 LAB — PROTIME-INR
INR: 1.1 (ref 0.8–1.2)
Prothrombin Time: 14 seconds (ref 11.4–15.2)

## 2019-08-13 LAB — TROPONIN I (HIGH SENSITIVITY)
Troponin I (High Sensitivity): 24 ng/L — ABNORMAL HIGH (ref <18)
Troponin I (High Sensitivity): 49 ng/L — ABNORMAL HIGH (ref <18)
Troponin I (High Sensitivity): 48 ng/L — ABNORMAL HIGH (ref <18)

## 2019-08-13 LAB — MRSA PCR SCREENING: MRSA by PCR: POSITIVE — AB

## 2019-08-13 LAB — TSH: TSH: 2.163 u[IU]/mL (ref 0.350–4.500)

## 2019-08-13 LAB — T4, FREE: Free T4: 1 ng/dL (ref 0.61–1.12)

## 2019-08-13 LAB — HIV ANTIBODY (ROUTINE TESTING W REFLEX): HIV Screen 4th Generation wRfx: NONREACTIVE

## 2019-08-13 SURGERY — TEMPORARY PACEMAKER
Anesthesia: Moderate Sedation

## 2019-08-13 MED ORDER — ONDANSETRON HCL 4 MG PO TABS: 4.0000 mg | ORAL_TABLET | Freq: Four times a day (QID) | ORAL | Status: DC | PRN

## 2019-08-13 MED ORDER — HEPARIN (PORCINE) IN NACL 1000-0.9 UT/500ML-% IV SOLN
INTRAVENOUS | Status: DC | PRN
  Administered 2019-08-13: 500 mL

## 2019-08-13 MED ORDER — LACTATED RINGERS IV SOLN: INTRAVENOUS | Status: DC

## 2019-08-13 MED ORDER — ADULT MULTIVITAMIN W/MINERALS CH
1.0000 | ORAL_TABLET | Freq: Every day | ORAL | Status: DC
  Administered 2019-08-13 – 2019-08-14 (×3): 1 via ORAL
  Filled 2019-08-13 (×2): qty 1

## 2019-08-13 MED ORDER — ASPIRIN EC 81 MG PO TBEC
81.0000 mg | DELAYED_RELEASE_TABLET | Freq: Every day | ORAL | Status: DC
  Administered 2019-08-14: 11:00:00 81 mg via ORAL
  Filled 2019-08-13: qty 1

## 2019-08-13 MED ORDER — MAGNESIUM OXIDE 400 (241.3 MG) MG PO TABS
200.0000 mg | ORAL_TABLET | Freq: Two times a day (BID) | ORAL | Status: DC
  Administered 2019-08-13 – 2019-08-14 (×2): 200 mg via ORAL
  Filled 2019-08-13 (×4): qty 1

## 2019-08-13 MED ORDER — VITAMIN D (ERGOCALCIFEROL) 1.25 MG (50000 UNIT) PO CAPS
50000.0000 [IU] | ORAL_CAPSULE | ORAL | Status: DC
  Administered 2019-08-14: 11:00:00 50000 [IU] via ORAL
  Filled 2019-08-13: qty 1

## 2019-08-13 MED ORDER — CO Q 10 100 MG PO CAPS: 100.0000 mg | ORAL_CAPSULE | Freq: Every day | ORAL | Status: DC

## 2019-08-13 MED ORDER — DOCUSATE SODIUM 100 MG PO CAPS
100.0000 mg | ORAL_CAPSULE | Freq: Every day | ORAL | Status: DC
  Administered 2019-08-14: 11:00:00 100 mg via ORAL
  Filled 2019-08-13: qty 1

## 2019-08-13 MED ORDER — ASCORBIC ACID 500 MG PO TABS
500.0000 mg | ORAL_TABLET | Freq: Every day | ORAL | Status: DC
  Administered 2019-08-13 – 2019-08-14 (×2): 500 mg via ORAL
  Filled 2019-08-13: qty 1

## 2019-08-13 MED ORDER — BUPRENORPHINE 10 MCG/HR TD PTWK
1.0000 | MEDICATED_PATCH | TRANSDERMAL | Status: DC
  Filled 2019-08-13: qty 1

## 2019-08-13 MED ORDER — VITAMIN D 25 MCG (1000 UNIT) PO TABS
1000.0000 [IU] | ORAL_TABLET | Freq: Every day | ORAL | Status: DC
  Administered 2019-08-13 – 2019-08-14 (×2): 1000 [IU] via ORAL
  Filled 2019-08-13 (×2): qty 1

## 2019-08-13 MED ORDER — VITAMIN E 45 MG (100 UNIT) PO CAPS
200.0000 [IU] | ORAL_CAPSULE | Freq: Every day | ORAL | Status: DC
  Administered 2019-08-13 – 2019-08-14 (×2): 200 [IU] via ORAL
  Filled 2019-08-13 (×3): qty 2

## 2019-08-13 MED ORDER — VITAMIN B-1 50 MG PO TABS
50.0000 mg | ORAL_TABLET | Freq: Every day | ORAL | Status: DC
  Administered 2019-08-13 – 2019-08-14 (×2): 50 mg via ORAL
  Filled 2019-08-13 (×3): qty 1

## 2019-08-13 MED ORDER — OMEGA-3-ACID ETHYL ESTERS 1 G PO CAPS
1.0000 g | ORAL_CAPSULE | Freq: Every day | ORAL | Status: DC
  Administered 2019-08-13 – 2019-08-14 (×2): 1 g via ORAL
  Filled 2019-08-13 (×2): qty 1

## 2019-08-13 MED ORDER — L-LYSINE 500 MG PO TABS: 500.0000 mg | ORAL_TABLET | Freq: Every day | ORAL | Status: DC

## 2019-08-13 MED ORDER — ONDANSETRON HCL 4 MG/2ML IJ SOLN: 4.0000 mg | Freq: Four times a day (QID) | INTRAMUSCULAR | Status: DC | PRN

## 2019-08-13 MED ORDER — PANTOPRAZOLE SODIUM 40 MG IV SOLR
40.0000 mg | INTRAVENOUS | Status: DC
  Administered 2019-08-13 – 2019-08-14 (×2): 40 mg via INTRAVENOUS
  Filled 2019-08-13: qty 40

## 2019-08-13 MED ORDER — ACETAMINOPHEN 500 MG PO TABS
1000.0000 mg | ORAL_TABLET | Freq: Two times a day (BID) | ORAL | Status: DC | PRN
  Filled 2019-08-13: qty 2

## 2019-08-13 MED ORDER — HEPARIN SODIUM (PORCINE) 5000 UNIT/ML IJ SOLN
5000.0000 [IU] | Freq: Three times a day (TID) | INTRAMUSCULAR | Status: DC
  Administered 2019-08-13 – 2019-08-14 (×3): 5000 [IU] via SUBCUTANEOUS
  Filled 2019-08-13 (×4): qty 1

## 2019-08-13 MED ORDER — ALENDRONATE SODIUM 70 MG PO TABS: 70.0000 mg | ORAL_TABLET | ORAL | Status: DC

## 2019-08-13 SURGICAL SUPPLY — 7 items
CABLE ADAPT PACING TEMP 12FT (ADAPTER) ×2 IMPLANT
CANNULA 5F STIFF (CANNULA) ×2 IMPLANT
NEEDLE PERC 18GX7CM (NEEDLE) ×2 IMPLANT
PACK CARDIAC CATH (CUSTOM PROCEDURE TRAY) ×2 IMPLANT
SHEATH AVANTI 6FR X 11CM (SHEATH) ×2 IMPLANT
SLEEVE REPOSITIONING LENGTH 30 (MISCELLANEOUS) ×2 IMPLANT
WIRE PACING TEMP ST TIP 5 (CATHETERS) ×2 IMPLANT

## 2019-08-13 NOTE — Progress Notes (Signed)

## 2019-08-13 NOTE — ED Provider Notes (Signed)
[facility] Emergency Department Provider Note ____________________________________________   First MD Initiated Contact with Patient 08/13/19 1428     (approximate)  I have reviewed the triage vital signs and the nursing notes.   HISTORY  Chief Complaint Bradycardia    HPI Devin Hale is a 69 y.o. male with PMH as noted below and no prior cardiac history who presents with weakness over the last 1 to 2 days, gradual onset, and constant but worse with exertion.  He has had some intermittent weakness, dizziness, and shortness of breath for about 1 month after he states he had a near syncopal event in early January.  He states that he has not quite felt right since then.  He denies any new medications.  He went to his PMD today due to these symptoms and was referred to the ED due to bradycardia.  Past Medical History:  Diagnosis Date  . Hepatitis   . Hepatitis C     Patient Active Problem List   Diagnosis Date Noted  . Chronic venous insufficiency 08/13/2018  . Hepatitis C 07/28/2018  . Swelling of limb 07/28/2018  . Osteoarthritis of both knees 11/23/2017    Past Surgical History:  Procedure Laterality Date  . KNEE ARTHROSCOPY Left 1982  . TONSILLECTOMY      Prior to Admission medications   Medication Sig Start Date End Date Taking? Authorizing Provider  acetaminophen (TYLENOL) 500 MG tablet Take 1,000 mg by mouth 2 (two) times daily as needed for moderate pain.     [provider]  alendronate (FOSAMAX) 70 MG tablet Take 70 mg by mouth once a week. 01/24/19   [provider]  buprenorphine (BUTRANS) 10 MCG/HR PTWK patch Place 1 patch onto the skin once a week.    [provider]  fluticasone (FLONASE) 50 MCG/ACT nasal spray Place 1 spray into both nostrils 2 (two) times daily.    [provider]  Multiple Vitamin (MULTIVITAMIN WITH MINERALS) TABS tablet Take 1 tablet by mouth daily.    [provider]    Vitamin D, Ergocalciferol, (DRISDOL) 1.25 MG (50000 UT) CAPS capsule Take 50,000 Units by mouth every 7 (seven) days.    [provider]    Allergies Patient has no known allergies.  Family History  Problem Relation Age of Onset  . Non-Hodgkin's lymphoma Mother   . Heart attack Father     Social History Social History   Tobacco Use  . Smoking status: Never Smoker  . Smokeless tobacco: Never Used  Substance Use Topics  . Alcohol use: No  . Drug use: Never    Review of Systems  Constitutional: No fever/chills.  Positive for generalized weakness. Eyes: No redness. ENT: No sore throat. Cardiovascular: Denies chest pain. Respiratory: Positive for intermittent shortness of breath. Gastrointestinal: No vomiting or diarrhea.  Genitourinary: Negative for dysuria.  Musculoskeletal: Negative for back pain. Skin: Negative for rash. Neurological: Negative for headache.   ____________________________________________   PHYSICAL EXAM:  VITAL SIGNS: ED Triage Vitals  Enc Vitals Group     BP 08/13/19 1419 (!) 136/35     Pulse Rate 08/13/19 1419 (!) 26     Resp 08/13/19 1419 18     Temp 08/13/19 1419 98.3 F (36.8 C)     Temp Source 08/13/19 1419 Oral     SpO2 08/13/19 1419 100 %     Weight 08/13/19 1421 164 lb (74.4 kg)     Height 08/13/19 1421 5\' 10"  (1.778 m)  Head Circumference --      Peak Flow --      Pain Score 08/13/19 1432 5     Pain Loc --      Pain Edu? --      Excl. in [place]? --     Constitutional: Alert and oriented.  Relatively well appearing and in no acute distress. Eyes: Conjunctivae are normal.  Head: Atraumatic. Nose: No congestion/rhinnorhea. Mouth/Throat: Mucous membranes are moist.   Neck: Normal range of motion.  Cardiovascular: Bradycardic, irregular rhythm. Grossly normal heart sounds.  Good peripheral circulation. Respiratory: Normal respiratory effort.  No retractions. Lungs CTAB. Gastrointestinal: Soft and nontender. No  distention.  Genitourinary: No flank tenderness. Musculoskeletal: No lower extremity edema.  Extremities warm and well perfused.  Neurologic:  Normal speech and language. No gross focal neurologic deficits are appreciated.  Skin:  Skin is warm and dry. No rash noted. Psychiatric: Mood and affect are normal. Speech and behavior are normal.  ____________________________________________   LABS (all labs ordered are listed, but only abnormal results are displayed)  Labs Reviewed  COMPREHENSIVE METABOLIC PANEL  CBC WITH DIFFERENTIAL/PLATELET  BRAIN NATRIURETIC PEPTIDE  URINALYSIS, COMPLETE (UACMP) WITH MICROSCOPIC  TROPONIN I (HIGH SENSITIVITY)   ____________________________________________  EKG  ED ECG REPORT I, Arta Silence, the attending physician, personally viewed and interpreted this ECG.  Date: 08/13/2019 EKG Time: 1422 Rate: 36 Rhythm: Bradycardia, likely third-degree AV block QRS Axis: normal Intervals: Third-degree AV block ST/T Wave abnormalities: normal Narrative Interpretation: no evidence of acute ischemia  ____________________________________________  RADIOLOGY  CXR: No focal infiltrate or other acute abnormality  ____________________________________________   PROCEDURES  Procedure(s) performed: No  Procedures  Critical Care performed: Yes  CRITICAL CARE Performed by: Arta Silence   Total critical care time: 45 minutes  Critical care time was exclusive of separately billable procedures and treating other patients.  Critical care was necessary to treat or prevent imminent or life-threatening deterioration.  Critical care was time spent personally by me on the following activities: development of treatment plan with patient and/or surrogate as well as nursing, discussions with consultants, evaluation of patient's response to treatment, examination of patient, obtaining history from patient or surrogate, ordering and performing  treatments and interventions, ordering and review of laboratory studies, ordering and review of radiographic studies, pulse oximetry and re-evaluation of patient's condition.  ____________________________________________   INITIAL IMPRESSION / ASSESSMENT AND PLAN / ED COURSE  Pertinent labs & imaging results that were available during my care of the patient were reviewed by me and considered in my medical decision making (see chart for details).  69 year old male with PMH as noted above presents with generalized weakness which has acutely worsened in the last few days, but which has been intermittently present over approximately last month.  He went to his PMD today and was noted to be bradycardic.  I reviewed the past medical records in [place].  The patient has a history of hepatitis C but no prior cardiac history.  He is not on any beta-blockers or other rate controlling medications.  On exam, the patient is overall relatively well-appearing.  His vital signs are normal except for heart rate ranging from the 20s to the 40s.  His blood pressure is stable.  EKG shows bradycardia with what appears to be third-degree heart block.  The patient is on continuous monitoring.  We will obtain a lab work-up and I consulted Dr. Fletcher Anon from cardiology.  Given that the patient is fully alert and maintaining a good blood  pressure, there is no indication for IV atropine or pacing in the ED.  ----------------------------------------- 4:00 PM on 08/13/2019 -----------------------------------------  I discussed the case with Dr. Fletcher Anon from cardiology who saw the patient.  He recommended taking the patient to the Cath Lab for urgent transvenous pacing.  The patient declined COVID-19 testing because he states he had a sinus surgery a few months ago and was told specifically not to have anything inserted into his nose.  However, he was attempting to contact his ENT to  clarify.  __________________________  Gwenyth Bouillon was evaluated in Emergency Department on 08/13/2019 for the symptoms described in the history of present illness. He was evaluated in the context of the global COVID-19 pandemic, which necessitated consideration that the patient might be at risk for infection with the SARS-CoV-2 virus that causes COVID-19. Institutional protocols and algorithms that pertain to the evaluation of patients at risk for COVID-19 are in a state of rapid change based on information released by regulatory bodies including the CDC and federal and state organizations. These policies and algorithms were followed during the patient's care in the ED.  ____________________________________________   FINAL CLINICAL IMPRESSION(S) / ED DIAGNOSES  Final diagnoses:  None      NEW MEDICATIONS STARTED DURING THIS VISIT:  New Prescriptions   No medications on file     Note:  This document was prepared using Dragon voice recognition software and may include unintentional dictation errors.    Arta Silence, MD 08/13/19 2001

## 2019-08-13 NOTE — ED Triage Notes (Signed)
Pt comes via POV from [facility] with c/o dizziness, weakness and felt he was going to pass out. pt states this has been ongoing for awhile.  Pt states he went to [facility] to be seen. Grannis checked vitals and brought patient over to ED.  Pt denies any chest pain. Pt states some SOB and states he feels he isn't getting a good breath.

## 2019-08-13 NOTE — ED Notes (Signed)
Full rainbow sent to lab.  

## 2019-08-13 NOTE — Progress Notes (Signed)
[facility] paged to ED w/alert that pt. was going to be sent to cath lab.  Pt. in bed discussing coming procedure w/RNs when [facility] arrived; pt. shared some reservations re: cath procedure, wanted to get in touch w/primary care physician and explore less invasive option.  Cardiologist paged to rm. --> explained need for cath procedure and attempted to contact pt.'s primary care MD.  After discussion of pt.'s current condition, pt. agreed to move forward w/procedure. Pt. not visibly in pain or distress; McLain indicated he would check up on him in ICU following his procedure in cath lab.  No further needs expressed.       08/13/19 1520  Clinical Encounter Type  Visited With Health care provider;Patient  Visit Type Initial;ED;Social support  Referral From Nurse  Consult/Referral To Chaplain  Recommendations follow up w/pt. on ICU if possible  Stress Factors  Patient Stress Factors Lack of knowledge;Loss of control;Major life changes;Health changes

## 2019-08-13 NOTE — Progress Notes (Signed)
PHARMACIST - PHYSICIAN ORDER COMMUNICATION  CONCERNING: P&T Medication Policy on Herbal Medications  DESCRIPTION:  This patient's order for:  Co Q-10 and L-lysine  has been noted.  This product(s) is classified as an "herbal" or natural product. Due to a lack of definitive safety studies or FDA approval, nonstandard manufacturing practices, plus the potential risk of unknown drug-drug interactions while on inpatient medications, the Pharmacy and Therapeutics Committee does not permit the use of "herbal" or natural products of this type within [facility].   ACTION TAKEN: The pharmacy department is unable to verify this order at this time and your patient has been informed of this safety policy. Please reevaluate patient's clinical condition at discharge and address if the herbal or natural product(s) should be resumed at that time.

## 2019-08-13 NOTE — Consult Note (Signed)
Cardiology Consultation:   Patient ID: Devin Hale MRN: [id number]; DOB: Oct 09, 1950  Admit date: 08/13/2019 Date of Consult: 08/13/2019  Primary Care Provider: Theotis Burrow, MD Primary Cardiologist: No primary care provider on file.  New Devin Hale) Primary Electrophysiologist:  None    Patient Profile:   Devin Hale is a 69 y.o. 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 Cherylann Hale.  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  He has no prior cardiac history.  He has not been feeling well for about 1 week with increased fatigue and orthostatic dizziness.  Yesterday, before he went to sleep, he had a syncopal episode while he was sitting.  He was alone.  He did not seek medical attention and went to sleep.  He continued to feel extremely dizzy and lightheaded and thus he called Devin Hale his primary care physician.  When he went there he was noted to have a heart rate in the high 20s and thus he was sent to the emergency room where an EKG confirmed complete heart block.  The patient does not take any antihypertensive medications and has no history of hypothyroidism.  He denies any chest pain.  He describes only mild exertional dyspnea.  Heart Pathway Score:     Past Medical History:  Diagnosis Date  . Hepatitis   . Hepatitis C     Past Surgical History:  Procedure Laterality Date  . KNEE ARTHROSCOPY Left 1982  . TONSILLECTOMY       Home Medications:  Prior to Admission medications   Medication Sig Start Date End Date Taking? Authorizing Provider  acetaminophen (TYLENOL) 500 MG tablet Take 1,000 mg by mouth 2 (two) times daily as needed  for moderate pain.    Yes [provider]  alendronate (FOSAMAX) 70 MG tablet Take 70 mg by mouth once a week. 01/24/19  Yes [provider]  ascorbic acid (VITAMIN C) 500 MG tablet Take 500 mg by mouth daily.   Yes [provider]  buprenorphine (BUTRANS) 10 MCG/HR PTWK patch Place 1 patch onto the skin once a week.   Yes [provider]  cholecalciferol (VITAMIN D3) 25 MCG (1000 UNIT) tablet Take 1,000 Units by mouth daily.   Yes [provider]  Coenzyme Q10 (CO Q 10) 100 MG CAPS Take 100 mg by mouth daily.   Yes [provider]  Docusate Sodium (DSS) 100 MG CAPS Take 100 mg by mouth daily.   Yes [provider]  L-Lysine 500 MG TABS Take 500 mg by mouth daily.   Yes [provider]  magnesium oxide (MAG-OX) 400 MG tablet Take 200 mg by mouth 2 (two) times daily.   Yes [provider]  Multiple Vitamin (MULTIVITAMIN WITH MINERALS) TABS tablet Take 1 tablet by mouth daily.   Yes [provider]  Omega-3 1000 MG CAPS Take 1 capsule by mouth daily.   Yes [provider]  thiamine (VITAMIN B-1) 50 MG tablet Take 50 mg by mouth daily.   Yes [provider]  Vitamin D, Ergocalciferol, (DRISDOL) 1.25 MG (50000 UT) CAPS capsule Take 50,000 Units by mouth once a week.    Yes  [provider]  vitamin E (VITAMIN E) 200 UNIT capsule Take 200 Units by mouth daily.   Yes [provider]    Inpatient Medications: Scheduled Meds:  Continuous Infusions:  PRN Meds:   Allergies:   No Known Allergies  Social History:   Social History   Socioeconomic History  . Marital status: Single    Spouse name: Not on file  . Number of children: Not on file  . Years of education: Not on file  . Highest education level: Not on file  Occupational History  . Not on file  Tobacco Use  . Smoking status: Never Smoker  . Smokeless tobacco: Never Used  Substance and Sexual Activity  .  Alcohol use: No  . Drug use: Never  . Sexual activity: Not on file  Other Topics Concern  . Not on file  Social History Narrative  . Not on file   Social Determinants of Health   Financial Resource Strain:   . Difficulty of Paying Living Expenses: Not on file  Food Insecurity:   . Worried About Charity fundraiser in the Last Year: Not on file  . Ran Out of Food in the Last Year: Not on file  Transportation Needs:   . Lack of Transportation (Medical): Not on file  . Lack of Transportation (Non-Medical): Not on file  Physical Activity:   . Days of Exercise per Week: Not on file  . Minutes of Exercise per Session: Not on file  Stress:   . Feeling of Stress : Not on file  Social Connections:   . Frequency of Communication with Friends and Family: Not on file  . Frequency of Social Gatherings with Friends and Family: Not on file  . Attends Religious Services: Not on file  . Active Member of Clubs or Organizations: Not on file  . Attends Archivist Meetings: Not on file  . Marital Status: Not on file  Intimate Partner Violence:   . Fear of Current or Ex-Partner: Not on file  . Emotionally Abused: Not on file  . Physically Abused: Not on file  . Sexually Abused: Not on file    Family History:    Family History  Problem Relation Age of Onset  . Non-Hodgkin's lymphoma Mother   . Heart attack Father      ROS:  Please see the history of present illness.   All other ROS reviewed and negative.     Physical Exam/Data:   Vitals:   08/13/19 1419 08/13/19 1421 08/13/19 1432 08/13/19 1440  BP: (!) 136/35     Pulse: (!) 26   (!) 27  Resp: 18   (!) 23  Temp: 98.3 F (36.8 C)     TempSrc: Oral     SpO2: 100%   100%  Weight:  74.4 kg 74.4 kg   Height:  5\' 10"  (1.778 m) 5\' 10"  (1.778 m)    No intake or output data in the 24 hours ending 08/13/19 1635 Last 3 Weights 08/13/2019 08/13/2019 08/01/2018  Weight (lbs) 164 lb 164 lb 162 lb  Weight (kg) 74.39 kg 74.39 kg  73.483 kg     Body mass index is 23.53 kg/m.  General:  Well nourished, well developed, in no acute distress HEENT: normal Lymph: no adenopathy Neck: no JVD Endocrine:  No thryomegaly Vascular: No carotid bruits; FA pulses 2+ bilaterally without bruits  Cardiac: Bradycardic and irregular due to escape rhythm.  No murmurs. Lungs:  clear to auscultation bilaterally,  no wheezing, rhonchi or rales  Abd: soft, nontender, no hepatomegaly  Ext: no edema Musculoskeletal:  No deformities, BUE and BLE strength normal and equal Skin: warm and dry  Neuro:  CNs 2-12 intact, no focal abnormalities noted Psych:  Normal affect   EKG:  The EKG was personally reviewed and demonstrates: Complete heart block with intermittent junctional and ventricular escape rhythm. Telemetry:  Telemetry was personally reviewed and demonstrates: Complete heart block  Relevant CV Studies: Echocardiogram is pending  Laboratory Data:  High Sensitivity Troponin:   Recent Labs  Lab 08/13/19 1427  TROPONINIHS 24*     Chemistry Recent Labs  Lab 08/13/19 1427  NA 139  K 5.0  CL 107  CO2 26  GLUCOSE 109*  BUN 20  CREATININE 1.09  CALCIUM 9.7  GFRNONAA >60  GFRAA >60  ANIONGAP 6    Recent Labs  Lab 08/13/19 1427  PROT 7.6  ALBUMIN 4.0  AST 28  ALT 18  ALKPHOS 67  BILITOT 1.1   Hematology Recent Labs  Lab 08/13/19 1427  WBC 8.1  RBC 5.12  HGB 14.8  HCT 44.3  MCV 86.5  MCH 28.9  MCHC 33.4  RDW 13.2  PLT 145*   BNP Recent Labs  Lab 08/13/19 1427  BNP 707.0*    DDimer No results for input(s): DDIMER in the last 168 hours.   Radiology/Studies:  CARDIAC CATHETERIZATION  Result Date: 08/13/2019 Successful transvenous temporary pacemaker placement via the right common femoral vein. Recommendations: Admit to the ICU. Continue to rule out myocardial infarction by enzymes. Obtain an echocardiogram. EP consult for a permanent pacemaker placement if no reversible cause of bradycardia is  identified.  DG Chest Portable 1 View  Result Date: 08/13/2019 CLINICAL DATA:  Bradycardia. EXAM: PORTABLE CHEST 1 VIEW COMPARISON:  12/13/2008 FINDINGS: The heart size and mediastinal contours are within normal limits. Both lungs are clear. The visualized skeletal structures are unremarkable. IMPRESSION: Normal exam. Electronically Signed   By: Lorriane Shire M.D.   On: 08/13/2019 15:24     Assessment and Plan:   1. Complete heart block: Associated with syncope, No reversible causes are identified.  He is not on any medications that can cause bradycardia.  His electrolytes are unremarkable.  He has no symptoms suggestive of acute coronary syndrome but will send serial troponin.  I recommend proceeding with urgent temporary pacemaker placement and ultimately he will likely require a permanent pacemaker.  Will obtain an echocardiogram and get EP involved tomorrow.  Temporary pacemaker placement was performed in the Cath Lab via the right common femoral vein without immediate complications. 2. Mildly elevated troponin: Likely supply demand ischemia.  Repeat another troponin and obtain an echocardiogram.  I do not recommend anticoagulation. 3. History of liver cirrhosis: He reports that his hepatitis C was treated in the past.  He seems to be compensated overall.      For questions or updates, please contact [facility] Please consult [email or web address] for contact info under     Signed, Kathlyn Sacramento, MD  08/13/2019 4:35 PM

## 2019-08-13 NOTE — ED Notes (Signed)
MD Siadecki and RN Olen Cordial at bedside

## 2019-08-13 NOTE — H&P (Signed)
History and Physical    Devin Hale [id number] DOB: [birth year] DOA: 08/13/2019   PCP: Theotis Burrow, MD   Patient coming from: home   Chief Complaint: Complete heart block.  HPI: Devin Hale is a 69 y.o. male with medical history significant of Hep C seen in ed on his way to cath lab for transvenous pacemaker for bradycardia in setting of complete heart block. cardilogist dr. Fletcher Anon is at bedside.    ED Course:  Blood pressure (!) 136/35, pulse (!) 27, temperature 98.3 F (36.8 C), temperature source Oral, resp. rate (!) 23, height 5\' 10"  (1.778 m), weight 74.4 kg, SpO2 100 %.  Pt is alert,awake,oriented and d/w cards is hesitant about procedure however consents to it.  Review of Systems: As per HPI otherwise 10 point review of systems negative.    Past Medical History:  Diagnosis Date  . Hepatitis   . Hepatitis C     Past Surgical History:  Procedure Laterality Date  . KNEE ARTHROSCOPY Left 1982  . TONSILLECTOMY       reports that he has never smoked. He has never used smokeless tobacco. He reports that he does not drink alcohol or use drugs.  No Known Allergies  Family History  Problem Relation Age of Onset  . Non-Hodgkin's lymphoma Mother   . Heart attack Father     Prior to Admission medications   Medication Sig Start Date End Date Taking? Authorizing Provider  acetaminophen (TYLENOL) 500 MG tablet Take 1,000 mg by mouth 2 (two) times daily as needed for moderate pain.    Yes [provider]  alendronate (FOSAMAX) 70 MG tablet Take 70 mg by mouth once a week. 01/24/19  Yes [provider]  ascorbic acid (VITAMIN C) 500 MG tablet Take 500 mg by mouth daily.   Yes [provider]  buprenorphine (BUTRANS) 10 MCG/HR PTWK patch Place 1 patch onto the skin once a week.   Yes [provider]  cholecalciferol (VITAMIN D3) 25 MCG (1000 UNIT) tablet Take 1,000 Units by mouth daily.   Yes [provider]  Coenzyme Q10  (CO Q 10) 100 MG CAPS Take 100 mg by mouth daily.   Yes [provider]  Docusate Sodium (DSS) 100 MG CAPS Take 100 mg by mouth daily.   Yes [provider]  L-Lysine 500 MG TABS Take 500 mg by mouth daily.   Yes [provider]  magnesium oxide (MAG-OX) 400 MG tablet Take 200 mg by mouth 2 (two) times daily.   Yes [provider]  Multiple Vitamin (MULTIVITAMIN WITH MINERALS) TABS tablet Take 1 tablet by mouth daily.   Yes [provider]  Omega-3 1000 MG CAPS Take 1 capsule by mouth daily.   Yes [provider]  thiamine (VITAMIN B-1) 50 MG tablet Take 50 mg by mouth daily.   Yes [provider]  Vitamin D, Ergocalciferol, (DRISDOL) 1.25 MG (50000 UT) CAPS capsule Take 50,000 Units by mouth once a week.    Yes [provider]  vitamin E (VITAMIN E) 200 UNIT capsule Take 200 Units by mouth daily.   Yes [provider]    Physical Exam: Vitals:   08/13/19 1419 08/13/19 1421 08/13/19 1432 08/13/19 1440  BP: (!) 136/35     Pulse: (!) 26   (!) 27  Resp: 18   (!) 23  Temp: 98.3 F (36.8 C)     TempSrc: Oral     SpO2: 100%   100%  Weight:  74.4 kg 74.4 kg   Height:  5\' 10"  (1.778 m) 5\' 10"  (1.778 m)     Constitutional: NAD, calm, comfortable Vitals:   08/13/19 1419 08/13/19 1421 08/13/19 1432 08/13/19 1440  BP: (!) 136/35     Pulse: (!) 26   (!) 27  Resp: 18   (!) 23  Temp: 98.3 F (36.8 C)     TempSrc: Oral     SpO2: 100%   100%  Weight:  74.4 kg 74.4 kg   Height:  5\' 10"  (1.778 m) 5\' 10"  (1.778 m)    Eyes: PERRL, lids and conjunctivae normal ENMT: Mucous membranes are moist. Posterior pharynx clear of any exudate or lesions.Normal dentition.  Neck: normal, supple, no masses, no thyromegaly Respiratory: clear to auscultation bilaterally, no wheezing, no crackles. Normal respiratory effort. No accessory muscle use.  Cardiovascular: Regular rate and rhythm, no murmurs / rubs / gallops. No extremity  edema. 2+ pedal pulses. No carotid bruits.  Abdomen: no tenderness, no masses palpated. No hepatosplenomegaly. Bowel sounds positive.  Musculoskeletal: no clubbing / cyanosis. No joint deformity upper and lower extremities. Good ROM, no contractures. Normal muscle tone.  Skin: no rashes, lesions, ulcers. No induration Neurologic: CN 2-12 grossly intact. Sensation intact, DTR normal. Strength 5/5 in all 4.  Psychiatric: Normal judgment and insight. Alert and oriented x 3. Normal mood.   Labs on Admission: I have personally reviewed following labs and imaging studies  CBC: Recent Labs  Lab 08/13/19 1427  WBC 8.1  NEUTROABS 5.8  HGB 14.8  HCT 44.3  MCV 86.5  PLT [id number]*   Basic Metabolic Panel: Recent Labs  Lab 08/13/19 1427  NA 139  K 5.0  CL 107  CO2 26  GLUCOSE 109*  BUN 20  CREATININE 1.09  CALCIUM 9.7   GFR: Estimated Creatinine Clearance: 68.8 mL/min (by C-G formula based on SCr of 1.09 mg/dL). Liver Function Tests: Recent Labs  Lab 08/13/19 1427  AST 28  ALT 18  ALKPHOS 18  BILITOT 1.1  PROT 7.6  ALBUMIN 4.0   Coagulation Profile: Recent Labs  Lab 08/13/19 1427  INR 1.1    Radiological Exams on Admission: CARDIAC CATHETERIZATION  Result Date: 08/13/2019 Successful transvenous temporary pacemaker placement via the right common femoral vein. Recommendations: Admit to the ICU. Continue to rule out myocardial infarction by enzymes. Obtain an echocardiogram. EP consult for a permanent pacemaker placement if no reversible cause of bradycardia is identified.  DG Chest Portable 1 View  Result Date: 08/13/2019 CLINICAL DATA:  Bradycardia. EXAM: PORTABLE CHEST 1 VIEW COMPARISON:  12/13/2008 FINDINGS: The heart size and mediastinal contours are within normal limits. Both lungs are clear. The visualized skeletal structures are unremarkable. IMPRESSION: Normal exam. Electronically Signed   By: Lorriane Shire M.D.   On: 08/13/2019 15:24    EKG: Independently  reviewed. Bradycardia at 36 with CHB.  Assessment/Plan Active Problems:   AV block, 3rd degree (HCC) Stat Lyme/RPR/TFT TVP per cardiology. Avoid any BB/CCB 2Decho. Cycle troponin.    DVT prophylaxis: Heparin Code Status: Full code Family Communication: None Disposition Plan: Home Consults called: Cardiology Consult-Dr.Arida Admission status: Inpatient  Para Skeans MD [facility] If 7PM-7AM, please contact night-coverage [email or web address] Password [facility] 08/13/2019, 4:50 PM

## 2019-08-13 NOTE — ED Notes (Signed)
MD at bedside. 

## 2019-08-13 NOTE — ED Notes (Signed)
Cardiology at bedside to answer pt questions and explain procedure/reasoning

## 2019-08-14 ENCOUNTER — Telehealth: Payer: Self-pay | Admitting: Internal Medicine

## 2019-08-14 ENCOUNTER — Inpatient Hospital Stay (HOSPITAL_COMMUNITY)
Admit: 2019-08-14 | Discharge: 2019-08-14 | Disposition: A | Discharge status: Home / Self Care | Payer: 59 | Attending: Cardiovascular Disease | Admitting: Cardiovascular Disease

## 2019-08-14 ENCOUNTER — Encounter: Payer: Self-pay | Admitting: Cardiology

## 2019-08-14 DIAGNOSIS — I340 Nonrheumatic mitral (valve) insufficiency: Secondary | ICD-10-CM

## 2019-08-14 DIAGNOSIS — R9431 Abnormal electrocardiogram [ECG] [EKG]: Secondary | ICD-10-CM

## 2019-08-14 LAB — ECHOCARDIOGRAM COMPLETE
Height: 70 in
Weight: 2691.38 oz

## 2019-08-14 LAB — RPR: RPR Ser Ql: NONREACTIVE

## 2019-08-14 LAB — GLUCOSE, CAPILLARY
Glucose-Capillary: 79 mg/dL (ref 70–99)
Glucose-Capillary: 294 mg/dL — ABNORMAL HIGH (ref 70–99)

## 2019-08-14 MED ORDER — VITAMIN D (ERGOCALCIFEROL) 1.25 MG (50000 UNIT) PO CAPS: 50000.0000 [IU] | ORAL_CAPSULE | ORAL | Status: DC

## 2019-08-14 MED ORDER — ASPIRIN 81 MG PO TBEC: 81.0000 mg | DELAYED_RELEASE_TABLET | Freq: Every day | ORAL | Status: DC

## 2019-08-14 MED ORDER — OMEGA-3-ACID ETHYL ESTERS 1 G PO CAPS: 1.0000 g | ORAL_CAPSULE | Freq: Every day | ORAL | Status: DC

## 2019-08-14 NOTE — Discharge Summary (Signed)
Physician Discharge Summary  Devin Hale DOB: 1950/10/02 DOA: 08/13/2019  PCP: Theotis Burrow, MD  Admit date: 08/13/2019 Discharge date: 08/14/2019  Admitted From: Home Disposition:  Transferring to [facility]  Recommendations for Outpatient Follow-up:  1. 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  He did not seek medical attention at that time and called his PCP the next day as he still felt dizzy and lightheaded.  Was seen in PCPs office where his heart rate was found in the upper 20s, prompting referral to the ED.  EKG obtained and confirmed complete heart block.  Patient was admitted to hospitalist service with cardiology consulted.  He was taken emergently to the cath lab where temporary venous pacer was placed.    Patient requested transfer to [facility] for consideration of placement of permanent pacemaker.  He was accepted in transfer and is stable for transport this evening.   Discharge Diagnoses: Principal Problem:   AV block, 3rd degree ([place]) Active Problems:   Complete heart block by electrocardiogram [facility])    Discharge Instructions    Allergies as of 08/14/2019   No Known Allergies     Medication List    STOP taking these medications   Omega-3 1000 MG Caps Replaced by: omega-3 acid ethyl esters 1 g capsule     TAKE these medications   acetaminophen 500 MG tablet Commonly known as: TYLENOL Take 1,000 mg by mouth 2 (two) times daily as needed for moderate pain.   alendronate 70 MG tablet Commonly known as: FOSAMAX Take 70 mg by  mouth once a week.   ascorbic acid 500 MG tablet Commonly known as: VITAMIN C Take 500 mg by mouth daily.   aspirin 81 MG EC tablet Take 1 tablet (81 mg total) by mouth daily. Start taking on: August 15, 2019   Butrans 10 MCG/HR Ptwk patch Generic drug: buprenorphine Place 1 patch onto the skin once a week.   cholecalciferol 25 MCG (1000 UNIT) tablet Commonly known as: VITAMIN D3 Take 1,000 Units by mouth daily.   Co Q 10 100 MG Caps Take 100 mg by mouth daily.   DSS 100 MG Caps Take 100 mg by mouth daily.   L-Lysine 500 MG Tabs Take 500 mg by mouth daily.   magnesium oxide 400 MG tablet Commonly known as: MAG-OX Take 200 mg by mouth 2 (two) times daily.   multivitamin with minerals Tabs tablet Take 1 tablet by mouth daily.   omega-3 acid ethyl esters 1 g capsule Commonly known as: LOVAZA Take 1 capsule (1 g total) by mouth daily. Start taking on: August 15, 2019 Replaces: Omega-3 1000 MG Caps   thiamine 50 MG tablet Commonly known as: VITAMIN B-1 Take 50 mg by mouth daily.   Vitamin D (Ergocalciferol) 1.25 MG (50000 UNIT) Caps capsule Commonly known as: DRISDOL Take 1 capsule (50,000 Units total) by mouth once a week. Start taking on: August 21, 2019   vitamin E 200 UNIT capsule Generic drug: vitamin E Take 200 Units by mouth daily.       No Known Allergies  Consultations:  Cardiology  EP  Procedures/Studies: CARDIAC CATHETERIZATION  Result Date: 08/13/2019 Successful transvenous temporary pacemaker placement via the right common femoral vein. Recommendations: Admit to the ICU. Continue to rule out myocardial infarction by enzymes. Obtain an echocardiogram. EP consult for a permanent pacemaker placement if no reversible cause of bradycardia is identified.  DG Chest Portable 1 View  Result Date: 08/13/2019 CLINICAL DATA:  Bradycardia. EXAM: PORTABLE CHEST 1 VIEW COMPARISON:  12/13/2008 FINDINGS: The heart size and mediastinal contours are  within normal limits. Both lungs are clear. The visualized skeletal structures are unremarkable. IMPRESSION: Normal exam. Electronically Signed   By: Lorriane Shire M.D.   On: 08/13/2019 15:24   ECHOCARDIOGRAM COMPLETE  Result Date: 08/14/2019    ECHOCARDIOGRAM REPORT   Patient Name:   Devin Hale Date of Exam: 08/14/2019 Medical Rec #:  [id number]   Height:       70.0 in Accession #:    [id number]  Weight:       168.2 lb Date of Birth:  01-Jul-1950    BSA:          1.939 m Patient Age:    51 years    BP:           122/110 mmHg Patient Gender: M           HR:           65 bpm. Exam Location:  [facility] Procedure: 2D Echo, Color Doppler and Cardiac Doppler Indications:     R94.31 Abnormal ECG  History:         Patient has no prior history of Echocardiogram examinations.                  Hepatitis C.  Sonographer:     Charmayne Sheer RDCS (AE) Referring Phys:  52 [facility] A ARIDA Diagnosing Phys: Nelva Bush MD  Sonographer Comments: Suboptimal subcostal window. IMPRESSIONS  1. Left ventricular ejection fraction, by estimation, is 55 to 60%. The left ventricle has normal function. The left ventricle has no regional wall motion abnormalities. Left ventricular diastolic parameters are indeterminate.  2. Right ventricular systolic function is normal. The right ventricular size is normal. Tricuspid regurgitation signal is inadequate for assessing PA pressure.  3. The mitral valve is normal in structure and function. Mild mitral valve regurgitation. No evidence of mitral stenosis.  4. The aortic valve is tricuspid. Aortic valve regurgitation is trivial. No aortic stenosis is present. FINDINGS  Left Ventricle: Left ventricular ejection fraction, by estimation, is 55 to 60%. The left ventricle has normal function. The left ventricle has no regional wall motion abnormalities. The left ventricular internal cavity size was normal in size. There is  no left ventricular hypertrophy. Left ventricular diastolic parameters are  indeterminate. Right Ventricle: The right ventricular size is normal. No increase in right ventricular wall thickness. Right ventricular systolic function is normal. Tricuspid regurgitation signal is inadequate for assessing PA pressure. Left Atrium: Left atrial size was normal in size. Right Atrium: Right atrial size was normal in size. Pericardium: There is no evidence of pericardial effusion. Mitral Valve: The mitral valve is normal in structure and function. There is mild thickening of the mitral valve leaflet(s). Mild mitral valve regurgitation. No evidence of mitral valve stenosis. MV peak gradient, 1.9 mmHg. The mean mitral valve gradient  is 1.0 mmHg. Tricuspid Valve: The tricuspid valve is normal in structure. Tricuspid valve regurgitation is trivial. Aortic Valve: The aortic valve is tricuspid. Aortic valve regurgitation is trivial. No aortic stenosis is present. Mild  aortic valve annular calcification. Aortic valve mean gradient measures 3.0 mmHg. Aortic valve peak gradient measures 5.2 mmHg. Aortic  valve area, by VTI measures 2.95 cm. Pulmonic Valve: The pulmonic valve was grossly normal. Pulmonic valve regurgitation is trivial. No evidence of pulmonic stenosis. Aorta: The aortic root is normal in size and structure. Pulmonary Artery: The pulmonary artery is of normal size. Venous: The inferior vena cava was not well visualized. IAS/Shunts: The interatrial septum was not well visualized.  LEFT VENTRICLE PLAX 2D LVIDd:         4.73 cm  Diastology LVIDs:         3.27 cm  LV e' lateral:   5.98 cm/s LV PW:         0.75 cm  LV E/e' lateral: 6.6 LV IVS:        0.84 cm  LV e' medial:    15.30 cm/s LVOT diam:     2.20 cm  LV E/e' medial:  2.6 LV SV:         50 LV SV Index:   26 LVOT Area:     3.80 cm  RIGHT VENTRICLE RV Basal diam:  3.73 cm LEFT ATRIUM             Index       RIGHT ATRIUM           Index LA diam:        3.30 cm 1.70 cm/m  RA Area:     14.10 cm LA Vol (A2C):   26.9 ml 13.87 ml/m RA Volume:    31.00 ml  15.99 ml/m LA Vol (A4C):   28.9 ml 14.90 ml/m LA Biplane Vol: 30.7 ml 15.83 ml/m  AORTIC VALVE                   PULMONIC VALVE AV Area (Vmax):    2.91 cm    PV Vmax:       0.90 m/s AV Area (Vmean):   3.14 cm    PV Vmean:      59.300 cm/s AV Area (VTI):     2.95 cm    PV VTI:        0.164 m AV Vmax:           114.00 cm/s PV Peak grad:  3.3 mmHg AV Vmean:          74.600 cm/s PV Mean grad:  2.0 mmHg AV VTI:            0.169 m AV Peak Grad:      5.2 mmHg AV Mean Grad:      3.0 mmHg LVOT Vmax:         87.20 cm/s LVOT Vmean:        61.600 cm/s LVOT VTI:          0.131 m LVOT/AV VTI ratio: 0.78  AORTA Ao Root diam: 3.20 cm MITRAL VALVE MV Area (PHT): 7.16 cm    SHUNTS MV Peak grad:  1.9 mmHg    Systemic VTI:  0.13 m MV Mean grad:  1.0 mmHg    Systemic Diam: 2.20 cm MV Vmax:       0.69 m/s MV Vmean:      46.3 cm/s MV Decel Time: 106 msec MV E velocity: 39.30 cm/s MV A velocity: 73.20 cm/s MV E/A ratio:  0.54 Harrell Gave End MD Electronically signed by Nelva Bush MD Signature Date/Time: 08/14/2019/4:50:25 PM    Final  2/22 - placement of temporary pacemaker   Subjective: Patient seen this AM.  Confirmed wish to transfer to [facility]. No acute complaints.  Paced rhythm on the monitor with HR 66-74 during encounter.   Discharge Exam: Vitals:   08/14/19 1600 08/14/19 1700  BP: 102/86 109/72  Pulse: 66 66  Resp: 20 20  Temp: 98 F (36.7 C)   SpO2: 95% 97%   Vitals:   08/14/19 1400 08/14/19 1500 08/14/19 1600 08/14/19 1700  BP:  (!) 85/61 102/86 109/72  Pulse: 66 66 66 66  Resp: 16 19 20 20   Temp:   98 F (36.7 C)   TempSrc:   Oral   SpO2: 96% 96% 95% 97%  Weight:      Height:        General: Pt is alert, awake, not in acute distress Cardiovascular: RRR, S1/S2 +, no rubs, no gallops Respiratory: CTA bilaterally, no wheezing, no rhonchi Abdominal: Soft, NT, ND, bowel sounds + Extremities: no edema, no cyanosis    The results of significant diagnostics from this  hospitalization (including imaging, microbiology, ancillary and laboratory) are listed below for reference.     Microbiology: Recent Results (from the past 240 hour(s))  MRSA PCR Screening     Status: Abnormal   Collection Time: 08/13/19  5:11 PM   Specimen: Nasal Mucosa; Nasopharyngeal  Result Value Ref Range Status   MRSA by PCR POSITIVE (A) NEGATIVE Final    Comment:        The GeneXpert MRSA Assay (FDA approved for NASAL specimens only), is one component of a comprehensive MRSA colonization surveillance program. It is not intended to diagnose MRSA infection nor to guide or monitor treatment for MRSA infections. RESULT CALLED TO, READ BACK BY AND VERIFIED WITH: SELICIA PRUDHOMME ON [id number] AT [place] Performed at [facility], [address] [place], [place] [address]      Labs: BNP (last 3 results) Recent Labs    08/13/19 1427  BNP 0000000*   Basic Metabolic Panel: Recent Labs  Lab 08/13/19 1427  NA 139  K 5.0  CL 107  CO2 26  GLUCOSE 109*  BUN 20  CREATININE 1.09  CALCIUM 9.7   Liver Function Tests: Recent Labs  Lab 08/13/19 1427  AST 28  ALT 18  ALKPHOS 67  BILITOT 1.1  PROT 7.6  ALBUMIN 4.0   No results for input(s): LIPASE, AMYLASE in the last 168 hours. No results for input(s): AMMONIA in the last 168 hours. CBC: Recent Labs  Lab 08/13/19 1427  WBC 8.1  NEUTROABS 5.8  HGB 14.8  HCT 44.3  MCV 86.5  PLT 145*   Cardiac Enzymes: No results for input(s): CKTOTAL, CKMB, CKMBINDEX, TROPONINI in the last 168 hours. BNP: Invalid input(s): POCBNP CBG: Recent Labs  Lab 08/13/19 1656  GLUCAP 79   D-Dimer No results for input(s): DDIMER in the last 72 hours. Hgb A1c No results for input(s): HGBA1C in the last 72 hours. Lipid Profile No results for input(s): CHOL, HDL, LDLCALC, TRIG, CHOLHDL, LDLDIRECT in the last 72 hours. Thyroid function studies Recent Labs    08/13/19 1427  TSH 2.163   Anemia work up No results  for input(s): VITAMINB12, FOLATE, FERRITIN, TIBC, IRON, RETICCTPCT in the last 72 hours. Urinalysis No results found for: COLORURINE, APPEARANCEUR, LABSPEC, [place], GLUCOSEU, [place], [place], [place], PROTEINUR, UROBILINOGEN, NITRITE, LEUKOCYTESUR Sepsis Labs Invalid input(s): PROCALCITONIN,  WBC,  LACTICIDVEN Microbiology Recent Results (from the past 240 hour(s))  MRSA PCR Screening  Status: Abnormal   Collection Time: 08/13/19  5:11 PM   Specimen: Nasal Mucosa; Nasopharyngeal  Result Value Ref Range Status   MRSA by PCR POSITIVE (A) NEGATIVE Final    Comment:        The GeneXpert MRSA Assay (FDA approved for NASAL specimens only), is one component of a comprehensive MRSA colonization surveillance program. It is not intended to diagnose MRSA infection nor to guide or monitor treatment for MRSA infections. RESULT CALLED TO, READ BACK BY AND VERIFIED WITH: SELICIA PRUDHOMME ON [id number] AT [address] Performed at [facility], [address] [place], [place] [address]      Time coordinating discharge: Over 30 minutes  SIGNED:   Ezekiel Slocumb, DO [facility] 08/14/2019, 6:56 PM   If 7PM-7AM, please contact night-coverage [email or web address]

## 2019-08-14 NOTE — Telephone Encounter (Signed)
New Message    Pt is calling and says he saw Dr Caryl Comes this morning while in the Intensive care unit at the ED. He says he has questions and would like to talk with Dr Caryl Comes  He asks for Dr Caryl Comes to call him at his room in the intensive care unit, Room 20. He did not have a direct number.    Please call

## 2019-08-14 NOTE — Progress Notes (Addendum)
Progress Note  Patient Name: Devin Hale Date of Encounter: 08/14/2019  Primary Cardiologist:  [facility], Dr. Saunders Revel rounding  Subjective   Overnight, patient documented as confrontational. Options discussed with patient and [facility] EP MD today and with patient indicating his preference to go to [facility] today. No current CP or further syncope reported.  Inpatient Medications    Scheduled Meds: . ascorbic acid  500 mg Oral Daily  . aspirin EC  81 mg Oral Daily  . [START ON 08/15/2019] buprenorphine  1 patch Transdermal Q Wed  . cholecalciferol  1,000 Units Oral Daily  . docusate sodium  100 mg Oral Daily  . heparin  5,000 Units Subcutaneous Q8H  . magnesium oxide  200 mg Oral BID  . multivitamin with minerals  1 tablet Oral Daily  . omega-3 acid ethyl esters  1 g Oral Daily  . pantoprazole (PROTONIX) IV  40 mg Intravenous Q24H  . thiamine  50 mg Oral Daily  . Vitamin D (Ergocalciferol)  50,000 Units Oral Weekly  . vitamin E  200 Units Oral Daily   Continuous Infusions: . lactated ringers 10 mL/hr at 08/14/19 0500   PRN Meds: acetaminophen, ondansetron **OR** ondansetron (ZOFRAN) IV   Vital Signs    Vitals:   08/14/19 0300 08/14/19 0400 08/14/19 0500 08/14/19 0600  BP: 97/66 92/61 97/82  119/79  Pulse: 60 60 60 60  Resp: 13 20 12 13   Temp:  98 F (36.7 C)    TempSrc:      SpO2: 97% 95% 96% 94%  Weight:      Height:        Intake/Output Summary (Last 24 hours) at 08/14/2019 1044 Last data filed at 08/14/2019 0500 Gross per 24 hour  Intake 92.14 ml  Output 1550 ml  Net -1457.86 ml   Last 3 Weights 08/13/2019 08/13/2019 08/13/2019  Weight (lbs) 168 lb 3.4 oz 164 lb 164 lb  Weight (kg) 76.3 kg 74.39 kg 74.39 kg      Telemetry    2:1 heart block - Personally Reviewed  ECG    No new tracings - Personally Reviewed  Physical Exam   GEN: No acute distress.   Neck: No JVD Cardiac: bradycardic and regular, no murmurs, rubs, or gallops.  Respiratory: Clear to  auscultation bilaterally. GI: Soft, nontender, non-distended  MS: No edema; No deformity. Neuro:  Nonfocal  Psych: Normal affect   Labs    High Sensitivity Troponin:   Recent Labs  Lab 08/13/19 1427 08/13/19 1848 08/13/19 2032  TROPONINIHS 24* 49* 48*      Cardiac EnzymesNo results for input(s): TROPONINI in the last 168 hours. No results for input(s): TROPIPOC in the last 168 hours.   Chemistry Recent Labs  Lab 08/13/19 1427  NA 139  K 5.0  CL 107  CO2 26  GLUCOSE 109*  BUN 20  CREATININE 1.09  CALCIUM 9.7  PROT 7.6  ALBUMIN 4.0  AST 28  ALT 18  ALKPHOS 67  BILITOT 1.1  GFRNONAA >60  GFRAA >60  ANIONGAP 6     Hematology Recent Labs  Lab 08/13/19 1427  WBC 8.1  RBC 5.12  HGB 14.8  HCT 44.3  MCV 86.5  MCH 28.9  MCHC 33.4  RDW 13.2  PLT 145*    BNP Recent Labs  Lab 08/13/19 1427  BNP 707.0*     DDimer No results for input(s): DDIMER in the last 168 hours.   Radiology    CARDIAC CATHETERIZATION  Result Date:  08/13/2019 Successful transvenous temporary pacemaker placement via the right common femoral vein. Recommendations: Admit to the ICU. Continue to rule out myocardial infarction by enzymes. Obtain an echocardiogram. EP consult for a permanent pacemaker placement if no reversible cause of bradycardia is identified.  DG Chest Portable 1 View  Result Date: 08/13/2019 CLINICAL DATA:  Bradycardia. EXAM: PORTABLE CHEST 1 VIEW COMPARISON:  12/13/2008 FINDINGS: The heart size and mediastinal contours are within normal limits. Both lungs are clear. The visualized skeletal structures are unremarkable. IMPRESSION: Normal exam. Electronically Signed   By: Lorriane Shire M.D.   On: 08/13/2019 15:24    Cardiac Studies   Echo pending  08/13/19 Temporary Pacemaker Placement Successful transvenous temporary pacemaker placement via the right common femoral vein. Recommendations: Admit to the ICU. Continue to rule out myocardial infarction by  enzymes. Obtain an echocardiogram. EP consult for a permanent pacemaker placement if no reversible cause of bradycardia is identified.  Patient Profile     69 y.o. male with a history of liver cirrhosis due to previous hepatitis C and who is being seen today for the evaluation of syncope and complete heart block s/p temporary pacemaker procedure 2/22 and EP consultation with plan for transfer to [facility] for consideration of PPM.  Assessment & Plan    Complete heart block --Associated with syncope. No reversible causes identified. He is not on any rate controlling medications. Electrolytes stable. No sx consistent with ACS with serial HS Tn unremarkable. He is now s/p temporary pacemaker placement 08/13/2019 and seen by [facility] EP this AM, at which time he indicates today a preference for transfer to [facility] for formal consideration of pacemaker placement. [facility] has been contacted and agreeable to accept the patient with estimated transfer date 2/23 (today) or 2/24 (tomorrow).  Mildly elevated HS Tn --Likely supply demand ischemia as minimally elevated and flat trending and not consistent with ACS. EKG without acute ST/T changes. Echo pending. No indication of IV heparin.  History of liver cirrhosis --Reports hepatitis C in the past. Well compensated.    For questions or updates, please contact [place] Please consult [email or web address] for contact info under        Signed, Arvil Chaco, PA-C  08/14/2019, 10:44 AM      Pt with persistent 2:1 AV block  Will need pacing  He has reached out to [facility] undiagnosed disease clinic and desires to be transferred to [facility]   I have spoken with [facility] and they have agreed to accept in transfer--realizing the issues relate to the underlying medical issues.  Hopefully today or tomorrow  If he chooses because of timing we will be glad to do pacing and then allow his dollowup at [facility] the [facility] ENT office and they clarified NO NASAL SWABS -- they  recommend orophyaryngeal testiing   I reached out to Dr Ardelle Park, not available in our system

## 2019-08-14 NOTE — Progress Notes (Signed)
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  He did not seek medical attention at that time and called his PCP the next day as he still felt dizzy and lightheaded.  Was seen in PCPs office where his heart rate was found in the upper 20s, prompting referral to the ED.  EKG obtained and confirmed complete heart block.  Patient was admitted to hospitalist service with cardiology consulted.  He was taken emergently to the cath lab where temporary venous pacer was placed.  Subjective 2/23: Per report, patient was argumentative and verbally abusive with staff overnight.  When seen and examined in stepdown this morning, patient interacted appropriately.  He inquires about transfer to [facility].  He inquires why his primary care doctor cannot see him in the hospital, and wants him to come see him.  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 Patient requesting transfer to [facility] for permanent pacemaker placement --Cardiology and EP are following --Monitor right femoral vein access site closely --Follow-up echo --[facility] has accepted patient in transfer, pending available bed  Mildly elevated troponin -likely demand ischemia in the setting of heart block.  No chest pain or acute EKG changes to suggest ACS.  History of liver cirrhosis due to hepatitis C status post  treatment -appears stable --Monitor LFTs   DVT prophylaxis: Heparin   Code Status: Full Code  Family Communication: None at bedside during encounter  Disposition Plan: Transfer to [facility] pending Coming From home Exp DC Date n/a Barriers as above Medically Stable for Discharge?  No  Consultants:   Cardiology  Electrophysiology  Procedures:   Temporary pacemaker placement  Antimicrobials:   none    Objective: Vitals:   08/14/19 0300 08/14/19 0400 08/14/19 0500 08/14/19 0600  BP: 97/66 92/61 97/82  119/79  Pulse: 60 60 60 60  Resp: 13 20 12 13   Temp:  98 F (36.7 C)    TempSrc:      SpO2: 97% 95% 96% 94%  Weight:      Height:        Intake/Output Summary (Last 24 hours) at 08/14/2019 0840 Last data filed at 08/14/2019 0500 Gross per 24 hour  Intake 92.14 ml  Output 1550 ml  Net -1457.86 ml   Filed Weights   08/13/19 1421 08/13/19 1432 08/13/19 1700  Weight: 74.4 kg 74.4 kg 76.3 kg    Examination:  General exam: awake, alert, no acute distress HEENT: moist mucus membranes, hearing grossly normal  Respiratory system: CTAB, no wheezes, rales or rhonchi, normal respiratory effort. Cardiovascular system: normal S1/S2, RRR, no JVD, murmurs, rubs, gallops, no pedal edema.   Central nervous system: A&O x4. no gross focal neurologic deficits, normal speech Extremities: moves all, normal tone, right femoral groin access site without active bleeding Skin: dry, intact, normal temperature, normal color Psychiatry: normal mood, congruent affect, judgement and insight appear normal  Data Reviewed: I have personally reviewed following labs and imaging studies  CBC: Recent Labs  Lab 08/13/19 1427  WBC 8.1  NEUTROABS 5.8  HGB 14.8  HCT 44.3  MCV 86.5  PLT [id number]*   Basic Metabolic Panel: Recent Labs  Lab 08/13/19 1427  NA 139  K 5.0  CL 107  CO2 26  GLUCOSE 109*  BUN 20  CREATININE 1.09  CALCIUM 9.7   GFR: Estimated Creatinine Clearance: 68.8 mL/min  (by C-G formula based on SCr of 1.09 mg/dL). Liver Function Tests: Recent Labs  Lab 08/13/19 1427  AST 28  ALT 18  ALKPHOS 67  BILITOT 1.1  PROT 7.6  ALBUMIN 4.0   No results for input(s): LIPASE, AMYLASE in the last 168 hours. No results for input(s): AMMONIA in the last 168 hours. Coagulation Profile: Recent Labs  Lab 08/13/19 1427  INR 1.1   Cardiac Enzymes: No results for input(s): CKTOTAL, CKMB, CKMBINDEX, TROPONINI in the last 168 hours. BNP (last 3 results) No results for input(s): PROBNP in the last 8760 hours. HbA1C: No results for input(s): HGBA1C in the last 72 hours. CBG: No results for input(s): GLUCAP in the last 168 hours. Lipid Profile: No results for input(s): CHOL, HDL, LDLCALC, TRIG, CHOLHDL, LDLDIRECT in the last 72 hours. Thyroid Function Tests: Recent Labs    08/13/19 1427  TSH 2.163  FREET4 1.00   Anemia Panel: No results for input(s): VITAMINB12, FOLATE, FERRITIN, TIBC, IRON, RETICCTPCT in the last 72 hours. Sepsis Labs: No results for input(s): PROCALCITON, LATICACIDVEN in the last 168 hours.  Recent Results (from the past 240 hour(s))  MRSA PCR Screening     Status: Abnormal   Collection Time: 08/13/19  5:11 PM   Specimen: Nasal Mucosa; Nasopharyngeal  Result Value Ref Range Status   MRSA by PCR POSITIVE (A) NEGATIVE Final    Comment:        The GeneXpert MRSA Assay (FDA approved for NASAL specimens only), is one component of a comprehensive MRSA colonization surveillance program. It is not intended to diagnose MRSA infection nor to guide or monitor treatment for MRSA infections. RESULT CALLED TO, READ BACK BY AND VERIFIED WITH: SELICIA PRUDHOMME ON [id number] AT 1940 TIK Performed at [facility], [address] [place], [place] [address]          Radiology Studies: CARDIAC CATHETERIZATION  Result Date: 08/13/2019 Successful transvenous temporary pacemaker placement via the right common femoral vein.  Recommendations: Admit to the ICU. Continue to rule out myocardial infarction by enzymes. Obtain an echocardiogram. EP consult for a permanent pacemaker placement if no reversible cause of bradycardia is identified.  DG Chest Portable 1 View  Result Date: 08/13/2019 CLINICAL DATA:  Bradycardia. EXAM: PORTABLE CHEST 1 VIEW COMPARISON:  12/13/2008 FINDINGS: The heart size and mediastinal contours are within normal limits. Both lungs are clear. The visualized skeletal structures are unremarkable. IMPRESSION: Normal exam. Electronically Signed   By: Lorriane Shire M.D.   On: 08/13/2019 15:24        Scheduled Meds: . ascorbic acid  500 mg Oral Daily  . aspirin EC  81 mg Oral Daily  . [START ON 08/15/2019] buprenorphine  1 patch Transdermal Q Wed  . cholecalciferol  1,000 Units Oral Daily  . docusate sodium  100 mg Oral Daily  . heparin  5,000 Units Subcutaneous Q8H  . magnesium oxide  200 mg Oral BID  . multivitamin with minerals  1 tablet Oral Daily  . omega-3 acid ethyl esters  1 g Oral Daily  . pantoprazole (PROTONIX) IV  40 mg Intravenous Q24H  . thiamine  50 mg Oral Daily  . Vitamin D (Ergocalciferol)  50,000 Units Oral Weekly  . vitamin E  200 Units Oral Daily   Continuous Infusions: . lactated ringers 10 mL/hr at 08/14/19 0500     LOS: 1 day    Time spent: 40 minutes    Ezekiel Slocumb, DO [facility]   If 7PM-7AM, please contact night-coverage [email or web address] 08/14/2019, 8:40 AM

## 2019-08-14 NOTE — Progress Notes (Signed)
Patient has been verbally abusive and belligerent with staff most of the shift. He has been assisted every time he called for assistance. He refused to have his gown and underpad changed when he spilled the urinal, which was brought to his bedside. Staff was able to change the blankets and top sheets, and slip two pads beneath him without bending his leg and maintaining a flat bed position with the temporary pacer.

## 2019-08-14 NOTE — Progress Notes (Signed)
[place] visited pt. as follow up from encounter in ED yesterday.  [place] consulted w/RN prior to visit; pt. has been agitated and verbally aggressive toward staff today.  Pt. under contact precautions for COVID because he refuses to allow for nasal swab COVID test. [place] visited pt. in appropriate PPE; pt. in bed w/meal tray @ bedside.  Pt. amiable -invited [place] to sit and talk.  Pt. explained he had a temporary pacemaker inserted via cath procedure yesterday; cardiologists told pt. his heart is not responding predictably to shocks from pacemaker (is missing beats); pt. anticipates transfer to [place], [facility], or [facility].  Pt. shared he is feeling very worried that 'these specialists haven't ever seen whatever's going on with me'; pt. becomes frustrated and agitated quickly if he feels 'out of the loop' in regards to his treatment, [place] observes in today and yesterday's visits.   During [facility] visit, pt. received word that bed had opened @ [facility]; pt. overwhelmed and expressed feeling out of control.  Conversation w/cardiologist calmed pt. considerably.  [place] left pt. to prepare for anticipated transport; no further needs expressed; pt. grateful for [facility] visit.        08/14/19 1630  Clinical Encounter Type  Visited With Patient;Health care provider  Visit Type Psychological support;Follow-up;Social support;Critical Care  Spiritual Encounters  Spiritual Needs Emotional  Stress Factors  Patient Stress Factors Loss of control;Lack of knowledge;Health changes

## 2019-08-14 NOTE — Progress Notes (Signed)
*  PRELIMINARY RESULTS* Echocardiogram 2D Echocardiogram has been performed.  Devin Hale 08/14/2019, 12:42 PM

## 2019-08-15 LAB — LYME DISEASE, WESTERN BLOT
IgG P18 Ab.: ABSENT
IgG P23 Ab.: ABSENT
IgG P28 Ab.: ABSENT
IgG P30 Ab.: ABSENT
IgG P39 Ab.: ABSENT
IgG P41 Ab.: ABSENT
IgG P45 Ab.: ABSENT
IgG P58 Ab.: ABSENT
IgG P66 Ab.: ABSENT
IgG P93 Ab.: ABSENT
IgM P23 Ab.: ABSENT
IgM P41 Ab.: ABSENT
Lyme IgG Wb: NEGATIVE
Lyme IgM Wb: NEGATIVE

## 2019-08-17 MED ORDER — CHOLECALCIFEROL 1.25 MG (50000 UT) PO CAPS
50000.00 | ORAL_CAPSULE | ORAL | Status: DC
Start: 2019-08-17 — End: 2019-08-17

## 2019-08-17 MED ORDER — ALENDRONATE SODIUM 70 MG PO TABS
70.00 | ORAL_TABLET | ORAL | Status: DC
Start: 2019-08-19 — End: 2019-08-17

## 2019-08-17 MED ORDER — GENERIC EXTERNAL MEDICATION
1.00 | Status: DC
Start: ? — End: 2019-08-17

## 2019-08-17 MED ORDER — ASPIRIN 81 MG PO TBEC
81.00 | DELAYED_RELEASE_TABLET | ORAL | Status: DC
Start: 2019-08-17 — End: 2019-08-17

## 2019-08-17 MED ORDER — LIDOCAINE HCL 1 % IJ SOLN
0.50 | INTRAMUSCULAR | Status: DC
Start: ? — End: 2019-08-17

## 2019-08-17 MED ORDER — FENTANYL CITRATE (PF) 50 MCG/ML IJ SOLN
25.00 | INTRAMUSCULAR | Status: DC
Start: ? — End: 2019-08-17

## 2019-08-17 MED ORDER — DSS 100 MG PO CAPS
100.00 | ORAL_CAPSULE | ORAL | Status: DC
Start: 2019-08-17 — End: 2019-08-17

## 2019-08-17 MED ORDER — ACETAMINOPHEN 325 MG PO TABS
650.00 | ORAL_TABLET | ORAL | Status: DC
Start: ? — End: 2019-08-17

## 2019-08-17 MED ORDER — ONDANSETRON HCL 4 MG/2ML IJ SOLN
4.00 | INTRAMUSCULAR | Status: DC
Start: ? — End: 2019-08-17

## 2019-08-17 MED ORDER — ATORVASTATIN CALCIUM 40 MG PO TABS
40.00 | ORAL_TABLET | ORAL | Status: DC
Start: 2019-08-17 — End: 2019-08-17

## 2019-08-18 MED ORDER — ASPIRIN 81 MG TABLET,DELAYED RELEASE
ORAL | 0.00000 days
Start: 2019-08-18 — End: ?

## 2019-08-21 DIAGNOSIS — K7460 Unspecified cirrhosis of liver: Principal | ICD-10-CM

## 2019-08-21 DIAGNOSIS — K769 Liver disease, unspecified: Principal | ICD-10-CM

## 2019-08-23 DIAGNOSIS — K7460 Unspecified cirrhosis of liver: Principal | ICD-10-CM

## 2019-08-23 DIAGNOSIS — Z8619 Personal history of other infectious and parasitic diseases: Principal | ICD-10-CM

## 2019-08-29 ENCOUNTER — Institutional Professional Consult (permissible substitution): Admit: 2019-08-29 | Discharge: 2019-08-30 | Payer: MEDICARE

## 2019-08-30 DIAGNOSIS — B182 Chronic viral hepatitis C: Principal | ICD-10-CM

## 2019-08-30 DIAGNOSIS — K7460 Unspecified cirrhosis of liver: Principal | ICD-10-CM

## 2019-08-31 DIAGNOSIS — K7460 Unspecified cirrhosis of liver: Principal | ICD-10-CM

## 2019-08-31 DIAGNOSIS — Z8619 Personal history of other infectious and parasitic diseases: Principal | ICD-10-CM

## 2019-09-02 ENCOUNTER — Emergency Department: Payer: 59

## 2019-09-02 ENCOUNTER — Other Ambulatory Visit: Payer: Self-pay

## 2019-09-02 ENCOUNTER — Encounter: Payer: Self-pay | Admitting: Emergency Medicine

## 2019-09-02 ENCOUNTER — Emergency Department
Admission: EM | Admit: 2019-09-02 | Discharge: 2019-09-02 | Disposition: A | Discharge status: Home / Self Care | Payer: 59 | Attending: Emergency Medicine | Admitting: Emergency Medicine

## 2019-09-02 DIAGNOSIS — R0602 Shortness of breath: Secondary | ICD-10-CM | POA: Insufficient documentation

## 2019-09-02 DIAGNOSIS — Z7982 Long term (current) use of aspirin: Secondary | ICD-10-CM | POA: Diagnosis not present

## 2019-09-02 DIAGNOSIS — Z79899 Other long term (current) drug therapy: Secondary | ICD-10-CM | POA: Insufficient documentation

## 2019-09-02 DIAGNOSIS — Z950 Presence of cardiac pacemaker: Secondary | ICD-10-CM | POA: Diagnosis not present

## 2019-09-02 LAB — CBC WITH DIFFERENTIAL/PLATELET
Abs Immature Granulocytes: 0.02 10*3/uL (ref 0.00–0.07)
Basophils Absolute: 0 10*3/uL (ref 0.0–0.1)
Basophils Relative: 1 %
Eosinophils Absolute: 0.1 10*3/uL (ref 0.0–0.5)
Eosinophils Relative: 2 %
HCT: 40.6 % (ref 39.0–52.0)
Hemoglobin: 13.2 g/dL (ref 13.0–17.0)
Immature Granulocytes: 0 %
Lymphocytes Relative: 22 %
Lymphs Abs: 1.4 10*3/uL (ref 0.7–4.0)
MCH: 28.4 pg (ref 26.0–34.0)
MCHC: 32.5 g/dL (ref 30.0–36.0)
MCV: 87.5 fL (ref 80.0–100.0)
Monocytes Absolute: 0.4 10*3/uL (ref 0.1–1.0)
Monocytes Relative: 6 %
Neutro Abs: 4.4 10*3/uL (ref 1.7–7.7)
Neutrophils Relative %: 69 %
Platelets: 197 10*3/uL (ref 150–400)
RBC: 4.64 MIL/uL (ref 4.22–5.81)
RDW: 13.1 % (ref 11.5–15.5)
WBC: 6.4 10*3/uL (ref 4.0–10.5)
nRBC: 0 % (ref 0.0–0.2)

## 2019-09-02 LAB — BASIC METABOLIC PANEL
Anion gap: 6 (ref 5–15)
BUN: 14 mg/dL (ref 8–23)
CO2: 28 mmol/L (ref 22–32)
Calcium: 9.5 mg/dL (ref 8.9–10.3)
Chloride: 104 mmol/L (ref 98–111)
Creatinine, Ser: 0.98 mg/dL (ref 0.61–1.24)
GFR calc Af Amer: >60 mL/min (ref >60)
GFR calc non Af Amer: >60 mL/min (ref >60)
Glucose, Bld: 55 mg/dL — ABNORMAL LOW (ref 70–99)
Potassium: 3.6 mmol/L (ref 3.5–5.1)
Sodium: 138 mmol/L (ref 135–145)

## 2019-09-02 LAB — GLUCOSE, CAPILLARY: Glucose-Capillary: 97 mg/dL (ref 70–99)

## 2019-09-02 LAB — TROPONIN I (HIGH SENSITIVITY): Troponin I (High Sensitivity): 7 ng/L (ref <18)

## 2019-09-02 NOTE — ED Notes (Signed)
Signature pad in room not working- printed and had pt sign hard copy for discharge

## 2019-09-02 NOTE — ED Notes (Signed)
Dr Kinner at bedside. 

## 2019-09-02 NOTE — ED Provider Notes (Signed)
[facility] Emergency Department Provider Note   ____________________________________________    I have reviewed the triage vital signs and the nursing notes.   HISTORY  Chief Complaint Shortness of Breath     HPI Devin Hale is a 70 y.o. male with a history as noted below who reports that he recently had a permanent pacemaker placed, had been doing well since discharge from [facility].  Last night he woke up around 1:00 and felt that his breathing was somewhat labored.  He was able to go back to sleep and felt better this morning.  No chest pain.  No pleurisy.  No nausea or vomiting.  He called cardiology at [facility] and they recommended that he come to the hospital for evaluation and chest x-ray.  Currently feels well.  No fevers chills or cough  Past Medical History:  Diagnosis Date  . Hepatitis   . Hepatitis C     Patient Active Problem List   Diagnosis Date Noted  . AV block, 3rd degree ([place]) 08/13/2019  . Complete heart block by electrocardiogram ([place]) 08/13/2019  . Chronic venous insufficiency 08/13/2018  . Hepatitis C 07/28/2018  . Swelling of limb 07/28/2018  . Osteoarthritis of both knees 11/23/2017    Past Surgical History:  Procedure Laterality Date  . KNEE ARTHROSCOPY Left 1982  . TEMPORARY PACEMAKER N/A 08/13/2019   Procedure: TEMPORARY PACEMAKER;  Surgeon: [name], MD;  Location: [place] CV LAB;  Service: Cardiovascular;  Laterality: N/A;  . TONSILLECTOMY      Prior to Admission medications   Medication Sig Start Date End Date Taking? Authorizing Provider  acetaminophen (TYLENOL) 500 MG tablet Take 1,000 mg by mouth 2 (two) times daily as needed for moderate pain.     [provider]  alendronate (FOSAMAX) 70 MG tablet Take 70 mg by mouth once a week. 01/24/19   [provider]  ascorbic acid (VITAMIN C) 500 MG tablet Take 500 mg by mouth daily.    [provider]  aspirin EC 81 MG EC  tablet Take 1 tablet (81 mg total) by mouth daily. 08/15/19   Ezekiel Slocumb, DO  buprenorphine (BUTRANS) 10 MCG/HR PTWK patch Place 1 patch onto the skin once a week.    [provider]  cholecalciferol (VITAMIN D3) 25 MCG (1000 UNIT) tablet Take 1,000 Units by mouth daily.    [provider]  Coenzyme Q10 (CO Q 10) 100 MG CAPS Take 100 mg by mouth daily.    [provider]  Docusate Sodium (DSS) 100 MG CAPS Take 100 mg by mouth daily.    [provider]  L-Lysine 500 MG TABS Take 500 mg by mouth daily.    [provider]  magnesium oxide (MAG-OX) 400 MG tablet Take 200 mg by mouth 2 (two) times daily.    [provider]  Multiple Vitamin (MULTIVITAMIN WITH MINERALS) TABS tablet Take 1 tablet by mouth daily.    [provider]  omega-3 acid ethyl esters (LOVAZA) 1 g capsule Take 1 capsule (1 g total) by mouth daily. 08/15/19   Nicole Kindred A, DO  thiamine (VITAMIN B-1) 50 MG tablet Take 50 mg by mouth daily.    [provider]  Vitamin D, Ergocalciferol, (DRISDOL) 1.25 MG (50000 UNIT) CAPS capsule Take 1 capsule (50,000 Units total) by mouth once a week. 08/21/19   Nicole Kindred A, DO  vitamin E (VITAMIN E) 200 UNIT capsule Take 200 Units by  mouth daily.    [provider]     Allergies Patient has no known allergies.  Family History  Problem Relation Age of Onset  . Non-Hodgkin's lymphoma Mother   . Heart attack Father     Social History Social History   Tobacco Use  . Smoking status: Never Smoker  . Smokeless tobacco: Never Used  Substance Use Topics  . Alcohol use: No  . Drug use: Never    Review of Systems  Constitutional: No fever/chills Eyes: No visual changes.  ENT: No sore throat. Cardiovascular: Denies chest pain. Respiratory: Denies shortness of breath. Gastrointestinal: No abdominal pain.  No nausea, no vomiting.   Genitourinary: Negative for dysuria. Musculoskeletal: Negative  for back pain. Skin: Negative for rash. Neurological: Negative for headaches or weakness   ____________________________________________   PHYSICAL EXAM:  VITAL SIGNS: ED Triage Vitals  Enc Vitals Group     BP 09/02/19 1526 140/88     Pulse Rate 09/02/19 1526 80     Resp 09/02/19 1526 18     Temp 09/02/19 1526 98.6 F (37 C)     Temp Source 09/02/19 1526 Oral     SpO2 09/02/19 1526 98 %     Weight 09/02/19 1527 71.2 kg (157 lb)     Height 09/02/19 1527 1.778 m (5\' 10" )     Head Circumference --      Peak Flow --      Pain Score 09/02/19 1527 0     Pain Loc --      Pain Edu? --      Excl. in [place]? --     Constitutional: Alert and oriented. No acute distress.  Nose: No congestion/rhinnorhea. Mouth/Throat: Mucous membranes are moist.    Cardiovascular: Normal rate, regular rhythm. Grossly normal heart sounds.  No rub. good peripheral circulation. Respiratory: Normal respiratory effort.  No retractions. Lungs CTAB. Gastrointestinal: Soft and nontender. No distention.  No CVA tenderness.  Musculoskeletal: Warm and well perfused Neurologic:  Normal speech and language. No gross focal neurologic deficits are appreciated.  Skin:  Skin is warm, dry and intact. No rash noted. Psychiatric: Mood and affect are normal. Speech and behavior are normal.  ____________________________________________   LABS (all labs ordered are listed, but only abnormal results are displayed)  Labs Reviewed  BASIC METABOLIC PANEL - Abnormal; Notable for the following components:      Result Value   Glucose, Bld 55 (*)    All other components within normal limits  CBC WITH DIFFERENTIAL/PLATELET  GLUCOSE, CAPILLARY  TROPONIN I (HIGH SENSITIVITY)   ____________________________________________  EKG  ED ECG REPORT I, Lavonia Drafts, the attending physician, personally viewed and interpreted this ECG.  Date: 09/02/2019  Rhythm: Paced QRS Axis: normal Intervals: Abnormal ST/T Wave  abnormalities: Nonspecific Narrative Interpretation: no evidence of acute ischemia  ____________________________________________  RADIOLOGY  Chest x-ray markable, normal heart size, no edema ____________________________________________   PROCEDURES  Procedure(s) performed: No  Procedures   Critical Care performed: No ____________________________________________   INITIAL IMPRESSION / ASSESSMENT AND PLAN / ED COURSE  Pertinent labs & imaging results that were available during my care of the patient were reviewed by me and considered in my medical decision making (see chart for details).  Patient presents with reports of labored breathing overnight, seems improved now, oxygenation respiratory rate is normal here.  Heart rate is normal.  Patient apparently had pacemaker interpreted remotely via [place] and it was normal.  Reviewed records from [facility], patient stay was  complicated by pericarditis however his pain has improved.  No suggestion of pericardial fluid effusion on x-ray nor on exam.  Discussed with cardiology Dr. Garen Lah who feels echo not necessary emergently.  Discussed with patient who is comfortable with follow-up with his cardiologist as an outpatient as he feels well.  Return precautions discussed    ____________________________________________   FINAL CLINICAL IMPRESSION(S) / ED DIAGNOSES  Final diagnoses:  SOB (shortness of breath)        Note:  This document was prepared using Dragon voice recognition software and may include unintentional dictation errors.   Lavonia Drafts, MD 09/02/19 2108

## 2019-09-02 NOTE — ED Triage Notes (Signed)
Pt to ED via POV, pt states that he had a pace marker placed on 2/24 at [facility]. Pt states that he was having some shortness of breath last night. Pt states that he called his on call cardiologist and they interrogated his pace marker, they did not see anything but told him to come get checked. Pt is in NAD.

## 2019-09-02 NOTE — ED Notes (Signed)
Pt given juice and graham crackers for blood sugar of 55.

## 2019-09-05 ENCOUNTER — Encounter: Admit: 2019-09-05 | Discharge: 2019-09-06 | Payer: MEDICARE

## 2019-09-07 DIAGNOSIS — K7460 Unspecified cirrhosis of liver: Principal | ICD-10-CM

## 2019-09-07 DIAGNOSIS — K769 Liver disease, unspecified: Principal | ICD-10-CM

## 2019-09-12 ENCOUNTER — Encounter: Admit: 2019-09-12 | Discharge: 2019-09-13 | Payer: MEDICARE

## 2019-09-12 DIAGNOSIS — M542 Cervicalgia: Principal | ICD-10-CM

## 2019-09-12 DIAGNOSIS — Z79891 Long term (current) use of opiate analgesic: Principal | ICD-10-CM

## 2019-09-12 DIAGNOSIS — M4802 Spinal stenosis, cervical region: Principal | ICD-10-CM

## 2019-09-12 DIAGNOSIS — M47816 Spondylosis without myelopathy or radiculopathy, lumbar region: Principal | ICD-10-CM

## 2019-09-25 DIAGNOSIS — R079 Chest pain, unspecified: Secondary | ICD-10-CM | POA: Insufficient documentation

## 2019-10-02 ENCOUNTER — Emergency Department: Payer: 59

## 2019-10-02 ENCOUNTER — Emergency Department
Admit: 2019-10-02 | Discharge: 2019-10-02 | Disposition: A | Discharge status: Home / Self Care | Payer: 59 | Attending: Cardiology | Admitting: Cardiology

## 2019-10-02 ENCOUNTER — Emergency Department
Admission: EM | Admit: 2019-10-02 | Discharge: 2019-10-02 | Disposition: A | Discharge status: Home / Self Care | Payer: 59 | Attending: Emergency Medicine | Admitting: Emergency Medicine

## 2019-10-02 ENCOUNTER — Other Ambulatory Visit: Payer: Self-pay

## 2019-10-02 DIAGNOSIS — Z20822 Contact with and (suspected) exposure to covid-19: Secondary | ICD-10-CM | POA: Diagnosis not present

## 2019-10-02 DIAGNOSIS — Z950 Presence of cardiac pacemaker: Secondary | ICD-10-CM | POA: Insufficient documentation

## 2019-10-02 DIAGNOSIS — J189 Pneumonia, unspecified organism: Secondary | ICD-10-CM | POA: Diagnosis not present

## 2019-10-02 DIAGNOSIS — I309 Acute pericarditis, unspecified: Secondary | ICD-10-CM | POA: Insufficient documentation

## 2019-10-02 DIAGNOSIS — Z7982 Long term (current) use of aspirin: Secondary | ICD-10-CM | POA: Diagnosis not present

## 2019-10-02 DIAGNOSIS — Z79899 Other long term (current) drug therapy: Secondary | ICD-10-CM | POA: Diagnosis not present

## 2019-10-02 DIAGNOSIS — R079 Chest pain, unspecified: Secondary | ICD-10-CM | POA: Diagnosis present

## 2019-10-02 LAB — ECHOCARDIOGRAM LIMITED
Height: 70 in
Weight: 2512 oz

## 2019-10-02 LAB — BASIC METABOLIC PANEL
Anion gap: 7 (ref 5–15)
BUN: 21 mg/dL (ref 8–23)
CO2: 25 mmol/L (ref 22–32)
Calcium: 9.2 mg/dL (ref 8.9–10.3)
Chloride: 104 mmol/L (ref 98–111)
Creatinine, Ser: 0.89 mg/dL (ref 0.61–1.24)
GFR calc Af Amer: >60 mL/min (ref >60)
GFR calc non Af Amer: >60 mL/min (ref >60)
Glucose, Bld: 136 mg/dL — ABNORMAL HIGH (ref 70–99)
Potassium: 4.5 mmol/L (ref 3.5–5.1)
Sodium: 136 mmol/L (ref 135–145)

## 2019-10-02 LAB — TROPONIN I (HIGH SENSITIVITY): Troponin I (High Sensitivity): 14 ng/L (ref <18)

## 2019-10-02 LAB — CBC
HCT: 43.3 % (ref 39.0–52.0)
Hemoglobin: 14.5 g/dL (ref 13.0–17.0)
MCH: 29.5 pg (ref 26.0–34.0)
MCHC: 33.5 g/dL (ref 30.0–36.0)
MCV: 88 fL (ref 80.0–100.0)
Platelets: 165 10*3/uL (ref 150–400)
RBC: 4.92 MIL/uL (ref 4.22–5.81)
RDW: 13.2 % (ref 11.5–15.5)
WBC: 10.9 10*3/uL — ABNORMAL HIGH (ref 4.0–10.5)
nRBC: 0 % (ref 0.0–0.2)

## 2019-10-02 LAB — C-REACTIVE PROTEIN: CRP: 4 mg/dL — ABNORMAL HIGH (ref <1.0)

## 2019-10-02 LAB — PROCALCITONIN: Procalcitonin: <0.1 ng/mL

## 2019-10-02 LAB — SEDIMENTATION RATE: Sed Rate: 12 mm/hr (ref 0–20)

## 2019-10-02 LAB — RESPIRATORY PANEL BY RT PCR (FLU A&B, COVID)
Influenza A by PCR: NEGATIVE
Influenza B by PCR: NEGATIVE
SARS Coronavirus 2 by RT PCR: NEGATIVE

## 2019-10-02 MED ORDER — COLCHICINE 0.6 MG PO TABS: 0.6000 mg | ORAL_TABLET | Freq: Two times a day (BID) | ORAL | 2 refills | Status: DC

## 2019-10-02 MED ORDER — FENTANYL CITRATE (PF) 100 MCG/2ML IJ SOLN
50.0000 ug | Freq: Once | INTRAMUSCULAR | Status: DC
  Filled 2019-10-02: qty 2

## 2019-10-02 MED ORDER — SODIUM CHLORIDE 0.9% FLUSH
3.0000 mL | Freq: Once | INTRAVENOUS | Status: AC
  Administered 2019-10-02: 3 mL via INTRAVENOUS

## 2019-10-02 MED ORDER — FENTANYL CITRATE (PF) 100 MCG/2ML IJ SOLN
50.0000 ug | Freq: Once | INTRAMUSCULAR | Status: AC
  Administered 2019-10-02: 50 ug via INTRAVENOUS
  Filled 2019-10-02: qty 2

## 2019-10-02 MED ORDER — SODIUM CHLORIDE 0.9 % IV SOLN
2.0000 g | Freq: Once | INTRAVENOUS | Status: AC
  Administered 2019-10-02: 2 g via INTRAVENOUS
  Filled 2019-10-02: qty 20

## 2019-10-02 MED ORDER — COLCHICINE 0.6 MG PO TABS
0.6000 mg | ORAL_TABLET | Freq: Two times a day (BID) | ORAL | Status: DC
  Administered 2019-10-02: 0.6 mg via ORAL
  Filled 2019-10-02 (×2): qty 1

## 2019-10-02 MED ORDER — IOHEXOL 350 MG/ML SOLN
75.0000 mL | Freq: Once | INTRAVENOUS | Status: AC | PRN
  Administered 2019-10-02: 75 mL via INTRAVENOUS

## 2019-10-02 MED ORDER — SODIUM CHLORIDE 0.9 % IV SOLN
500.0000 mg | Freq: Once | INTRAVENOUS | Status: AC
  Administered 2019-10-02: 500 mg via INTRAVENOUS
  Filled 2019-10-02: qty 500

## 2019-10-02 MED ORDER — ACETAMINOPHEN 325 MG PO TABS
650.0000 mg | ORAL_TABLET | ORAL | Status: DC | PRN
  Administered 2019-10-02: 650 mg via ORAL
  Filled 2019-10-02: qty 2

## 2019-10-02 MED ORDER — SODIUM CHLORIDE 0.9 % IV BOLUS
500.0000 mL | Freq: Once | INTRAVENOUS | Status: AC
  Administered 2019-10-02: 500 mL via INTRAVENOUS

## 2019-10-02 MED ORDER — ACETAMINOPHEN 500 MG PO TABS: 500.0000 mg | ORAL_TABLET | ORAL | 0 refills | Status: DC | PRN

## 2019-10-02 MED ORDER — DOXYCYCLINE HYCLATE 100 MG PO CAPS: 100.0000 mg | ORAL_CAPSULE | Freq: Two times a day (BID) | ORAL | 0 refills | Status: AC

## 2019-10-02 MED ORDER — ONDANSETRON HCL 4 MG/2ML IJ SOLN
4.0000 mg | Freq: Once | INTRAMUSCULAR | Status: AC
  Administered 2019-10-02: 4 mg via INTRAVENOUS
  Filled 2019-10-02: qty 2

## 2019-10-02 NOTE — Discharge Instructions (Addendum)
Call Dr. Saralyn Pilar office to set up follow-up  Take tylenol 500 to 1000 mg every 4-6 hours for pain. Do not take more than 4000 mg over a 24 hour period.  Take the antibiotic as prescribed

## 2019-10-02 NOTE — Consult Note (Addendum)
Cardiology Consultation Note    Patient ID: Devin Hale, MRN: [id number], DOB/AGE: Nov 12, 1950 69 y.o. Admit date: 10/02/2019   Date of Consult: 10/02/2019 Primary Physician: Theotis Burrow, MD Primary Cardiologist: Dr. Saralyn Pilar  Chief Complaint: chest pain Reason for Consultation: chest pain Requesting MD: Dr. Ellender Hose  HPI: Devin Hale is a 69 y.o. 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  He was treated with colchicine for 1 month and now returns with recurrent pericarditis symptoms.  His course began in February of this year when he was evaluated in the emergency room after a another syncopal episode which had repeatedly happened in the past.  Was noted to be in high-grade heart block.  Had a temporary wire placed and he was transferred to [facility].  He was evaluated previously with a cardiac catheterization in February of this year with mild to moderate left circumflex and RCA disease with low cardiovascular risk was treated medically.  Echocardiogram at [facility] done on 08/15/2019 revealed trivial MR with no pericardial effusion.  Repeat echo 08/19/2019 revealed trivial pericardial effusion with no hemodynamic compromise.  He now returns emergency room with recurrent chest pain typical of pericarditis and that is positional and pleuritic.  Patient had troponin drawn per protocol which was 14.  Sed rate was 12.  Renal function was normal.  Chest CT revealed no pulmonary embolus with pericardial effusion.  He remains hemodynamically stable.  Past Medical History:  Diagnosis Date  . Hepatitis   . Hepatitis C       Surgical History:  Past Surgical History:  Procedure Laterality Date  . KNEE ARTHROSCOPY Left 1982  . TEMPORARY PACEMAKER N/A 08/13/2019   Procedure: TEMPORARY PACEMAKER;  Surgeon: [name], MD;  Location: [facility];  Service: Cardiovascular;  Laterality: N/A;  . TONSILLECTOMY       Home Meds: Prior to Admission medications   Medication Sig Start Date End Date Taking? Authorizing Provider  acetaminophen (TYLENOL) 500 MG tablet Take 1,000 mg by mouth 2 (two) times daily as needed for moderate pain.    Yes [provider]  alendronate (FOSAMAX) 70 MG tablet Take 70 mg by mouth once a week. 01/24/19  Yes [provider]  ascorbic acid (VITAMIN C) 500 MG tablet Take 500 mg by mouth 2 (two) times daily.    Yes [provider]  aspirin EC 81 MG EC tablet Take 1 tablet (81 mg total) by mouth daily. 08/15/19  Yes Nicole Kindred A, DO  atorvastatin (LIPITOR) 40 MG tablet Take 40 mg by mouth daily. 08/17/19  Yes [provider]  buprenorphine (BUTRANS) 10 MCG/HR PTWK patch Place 1 patch onto the skin once a week.   Yes [provider]  cholecalciferol (VITAMIN D3) 25 MCG (1000 UNIT) tablet Take 1,000 Units by mouth daily.   Yes [provider]  Coenzyme Q10 (CO Q 10) 100 MG CAPS Take 100 mg by mouth daily.   Yes [provider]  Docusate Sodium (DSS) 100 MG CAPS Take 100 mg by mouth daily as needed.    Yes [provider]  ELIQUIS 5 MG TABS tablet Take 5 mg by mouth 2 (two) times daily. 09/27/19  Yes [provider]  L-Lysine 500 MG TABS Take 250 mg by mouth in the morning and at bedtime.    Yes [provider]  magnesium oxide (MAG-OX)  400 MG tablet Take 200 mg by mouth 2 (two) times daily.   Yes [provider]  Multiple Vitamin (MULTIVITAMIN WITH MINERALS) TABS tablet Take 1 tablet by mouth daily.   Yes [provider]  Nutritional Supplements (DHEA PO) Take 1 tablet by mouth daily.   Yes [provider]  omega-3 acid ethyl esters (LOVAZA) 1 g capsule Take 1 capsule (1 g total) by mouth daily. Patient taking differently: Take 1 g by mouth 2 (two) times daily.  08/15/19  Yes Nicole Kindred A, DO   thiamine (VITAMIN B-1) 50 MG tablet Take 25 mg by mouth in the morning and at bedtime.    Yes [provider]  Vitamin D, Ergocalciferol, (DRISDOL) 1.25 MG (50000 UNIT) CAPS capsule Take 1 capsule (50,000 Units total) by mouth once a week. 08/21/19  Yes Nicole Kindred A, DO  vitamin E (VITAMIN E) 200 UNIT capsule Take 200 Units by mouth daily.   Yes [provider]    Inpatient Medications:  . colchicine  0.6 mg Oral BID   . azithromycin (ZITHROMAX) 500 MG IVPB (Vial-Mate [employer])    . cefTRIAXone (ROCEPHIN)  IV    . sodium chloride      Allergies: No Known Allergies  Social History   Socioeconomic History  . Marital status: Single    Spouse name: Not on file  . Number of children: Not on file  . Years of education: Not on file  . Highest education level: Not on file  Occupational History  . Not on file  Tobacco Use  . Smoking status: Never Smoker  . Smokeless tobacco: Never Used  Substance and Sexual Activity  . Alcohol use: No  . Drug use: Never  . Sexual activity: Not on file  Other Topics Concern  . Not on file  Social History Narrative  . Not on file   Social Determinants of Health   Financial Resource Strain:   . Difficulty of Paying Living Expenses:   Food Insecurity:   . Worried About Charity fundraiser in the Last Year:   . Arboriculturist in the Last Year:   Transportation Needs:   . Film/video editor (Medical):   [name] Lack of Transportation (Non-Medical):   Physical Activity:   . Days of Exercise per Week:   . Minutes of Exercise per Session:   Stress:   . Feeling of Stress :   Social Connections:   . Frequency of Communication with Friends and Family:   . Frequency of Social Gatherings with Friends and Family:   . Attends Religious Services:   . Active Member of Clubs or Organizations:   . Attends Archivist Meetings:   [name] Marital Status:   Intimate Partner Violence:   . Fear of Current or Ex-Partner:   .  Emotionally Abused:   [name] Physically Abused:   . Sexually Abused:      Family History  Problem Relation Age of Onset  . Non-Hodgkin's lymphoma Mother   . Heart attack Father      Review of Systems: A 12-system review of systems was performed and is negative except as noted in the HPI.  Labs: No results for input(s): CKTOTAL, CKMB, TROPONINI in the last 72 hours. Lab Results  Component Value Date   WBC 10.9 (H) 10/02/2019   HGB 14.5 10/02/2019   HCT 43.3 10/02/2019   MCV 88.0 10/02/2019   PLT 165 10/02/2019    Recent Labs  Lab 10/02/19  0821  NA 136  K 4.5  CL 104  CO2 25  BUN 21  CREATININE 0.89  CALCIUM 9.2  GLUCOSE 136*   No results found for: CHOL, HDL, LDLCALC, TRIG No results found for: DDIMER  Radiology/Studies:  DG Chest 2 View  Result Date: 10/02/2019 CLINICAL DATA:  Chest pain and dyspnea EXAM: CHEST - 2 VIEW COMPARISON:  09/02/2019 chest radiograph. FINDINGS: Stable configuration of 2 lead left subclavian pacemaker. Mild cardiomegaly. Otherwise normal mediastinal contour. No pneumothorax. No right pleural effusion. Probable trace left pleural effusion. No overt pulmonary edema. New left lung base opacity silhouetting the left hemidiaphragm. IMPRESSION: 1. New left lung base opacity, favor atelectasis, cannot exclude pneumonia. Chest radiograph follow-up advised. 2. Probable trace left pleural effusion. 3. Mild cardiomegaly without overt pulmonary edema. Electronically Signed   By: Ilona Sorrel M.D.   On: 10/02/2019 09:11   DG Chest 2 View  Result Date: 09/02/2019 CLINICAL DATA:  Recent pacemaker placement, shortness of breath EXAM: CHEST - 2 VIEW COMPARISON:  08/13/2019 FINDINGS: The heart size and mediastinal contours are within normal limits. Interval placement of left chest multi lead pacer. Both lungs are clear. The visualized skeletal structures are unremarkable. IMPRESSION: No acute abnormality of the lungs. Electronically Signed   By: Eddie Candle M.D.    On: 09/02/2019 18:04   CT Angio Chest PE W and/or Wo Contrast  Result Date: 10/02/2019 CLINICAL DATA:  Chest pain and shortness of breath EXAM: CT ANGIOGRAPHY CHEST WITH CONTRAST TECHNIQUE: Multidetector CT imaging of the chest was performed using the standard protocol during bolus administration of intravenous contrast. Multiplanar CT image reconstructions and MIPs were obtained to evaluate the vascular anatomy. CONTRAST:  42mL OMNIPAQUE IOHEXOL 350 MG/ML SOLN COMPARISON:  Chest radiograph October 02, 2019; chest CT December 13, 2008 FINDINGS: Cardiovascular: There is no demonstrable pulmonary embolus. There is no appreciable thoracic aortic aneurysm or dissection. Visualized great vessels appear unremarkable. Note that the right innominate and left common carotid arteries arise as a common trunk, an anatomic variant. Left vertebral artery arises directly from the aortic arch, an anatomic variant. There are foci of coronary artery calcification. Pacemaker leads are attached to the right atrium and right ventricle. There is a sizable pericardial effusion. The pericardium does not appear thickened. Mediastinum/Nodes: Thyroid appears unremarkable. No appreciable thoracic adenopathy. There appears to be a degree of esophageal wall thickening. Lungs/Pleura: There are areas of airspace consolidation in each lower lobe posteriorly as well as along the lateral aspect of the lingula. There is bibasilar atelectasis as well. Upper Abdomen: Prominent venous structures in the perisplenic region incompletely visualized. Visualized upper abdominal structures otherwise appear unremarkable. Musculoskeletal: No blastic or lytic bone lesions. Pacemaker device present on the left anteriorly. Chest wall otherwise unremarkable. Review of the MIP images confirms the above findings. IMPRESSION: 1. No demonstrable pulmonary embolus. No thoracic aortic aneurysm or dissection. There are foci of coronary artery calcification. Pacemaker leads  attached to right atrium and right ventricle. 2.  Sizable pericardial effusion of uncertain etiology. 3. Foci of airspace consolidation in each posterior lung base and along the lateral lingular region. Bibasilar atelectasis as well. 4. There appears to be wall thickening throughout portions of the esophagus. Question underlying esophagitis. 5.  No evident adenopathy. 6. Prominent perisplenic venous structures, incompletely visualized. Question varices. Electronically Signed   By: Lowella Grip III M.D.   On: 10/02/2019 10:31    Wt Readings from Last 3 Encounters:  10/02/19 71.2 kg  09/02/19 71.2 kg  08/13/19 76.3 kg    EKG: Sinus tachycardia with nonspecific T wave inversion similar to previous EKG done 1 month ago.  Physical Exam:  Blood pressure 114/88, pulse 89, temperature 98.7 F (37.1 C), temperature source Oral, resp. rate (!) 31, height 5\' 10"  (1.778 m), weight 71.2 kg, SpO2 100 %. Body mass index is 22.53 kg/m. General: Well developed, well nourished, in no acute distress. Head: Normocephalic, atraumatic, sclera non-icteric, no xanthomas, nares are without discharge.  Neck: Negative for carotid bruits. JVD not elevated. Lungs: Clear bilaterally to auscultation without wheezes, rales, or rhonchi. Breathing is unlabored. Heart: RRR with S1 S2. No murmurs, rubs, or gallops appreciated. Abdomen: Soft, non-tender, non-distended with normoactive bowel sounds. No hepatomegaly. No rebound/guarding. No obvious abdominal masses. Msk:  Strength and tone appear normal for age. Extremities: No clubbing or cyanosis. No edema.  Distal pedal pulses are 2+ and equal bilaterally. Neuro: Alert and oriented X 3. No facial asymmetry. No focal deficit. Moves all extremities spontaneously. Psych:  Responds to questions appropriately with a normal affect.     Assessment and Plan  69 year old male with history of insignificant coronary artery disease by recent cardiac cath 2 months ago, history of  complete heart block status post dual-chamber pacemaker complicated by pericarditis.  Was treated for 1 month with colchicine but has had recurrent symptoms.  Troponin is normal and it is of note the patient had insignificant coronary disease recently.  Chest CT revealed no pulmonary embolus but a pericardial effusion.  He is hemodynamically stable.  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  Will restart colchicine at 0.6 mg twice daily and this should be continued for 3 months.  We will also treat with acetaminophen as the patient defers ibuprofen or narcotics.  Would not treat with steroids.  Further recommendations after repeat echo determine if there is any significant change in the pericardial effusion.  Signed, Teodoro Spray MD 10/02/2019, 12:43 PM Pager: [phone]   Addendum: Echo showed preserved LV function with small to moderate pericardial effusion, circumferential but more prominent posteriorly.  No evidence of hemodynamic compromise or tamponade.  Would attempt pain control with colchicine 0.6 mg twice daily for 3 months.  Patient is not able to take nonsteroidals.  Would use acetaminophen and transient use of narcotic pain relief.  If pain relief can be achieved, okay for outpatient follow-up with cardiology.

## 2019-10-02 NOTE — ED Provider Notes (Addendum)
[facility] Emergency Department Provider Note  ____________________________________________   First MD Initiated Contact with Patient 10/02/19 [phone]     (approximate)  I have reviewed the triage vital signs and the nursing notes.   HISTORY  Chief Complaint Chest Pain    HPI Devin Hale is a 69 y.o. 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  The patient states that over the last 2 days, he has had progressively worsening sharp, pressure-like, diffuse, upper chest pain.  The pain is worse with any kind of movement or deep inspiration.  He feels like he cannot take a deep breath.  He feels significantly short of breath.  He is having difficulty even talking without getting short of breath.  The symptoms feel somewhat similar to his prior episode of pericarditis.  Denies any fevers or chills.  No recent illnesses.  Denies any cough until today. No alleviating factors.       Past Medical History:  Diagnosis Date  . Hepatitis   . Hepatitis C     Patient Active Problem List   Diagnosis Date Noted  . AV block, 3rd degree ([place]) 08/13/2019  . Complete heart block by electrocardiogram ([facility]) 08/13/2019  . Chronic venous insufficiency 08/13/2018  . Hepatitis C 07/28/2018  . Swelling of limb 07/28/2018  . Osteoarthritis of both knees 11/23/2017    Past Surgical History:  Procedure Laterality Date  . KNEE ARTHROSCOPY Left 1982  . TEMPORARY PACEMAKER N/A 08/13/2019   Procedure: TEMPORARY PACEMAKER;  Surgeon: [name], MD;  Location: [facility];  Service: Cardiovascular;  Laterality: N/A;  . TONSILLECTOMY      Prior to Admission medications   Medication Sig Start Date End Date Taking? Authorizing Provider  acetaminophen (TYLENOL) 500 MG tablet Take 1,000 mg by mouth 2 (two) times daily as needed for moderate pain.    Yes [provider]    alendronate (FOSAMAX) 70 MG tablet Take 70 mg by mouth once a week. 01/24/19  Yes [provider]  ascorbic acid (VITAMIN C) 500 MG tablet Take 500 mg by mouth 2 (two) times daily.    Yes [provider]  aspirin EC 81 MG EC tablet Take 1 tablet (81 mg total) by mouth daily. 08/15/19  Yes Nicole Kindred A, DO  atorvastatin (LIPITOR) 40 MG tablet Take 40 mg by mouth daily. 08/17/19  Yes [provider]  buprenorphine (BUTRANS) 10 MCG/HR PTWK patch Place 1 patch onto the skin once a week.   Yes [provider]  cholecalciferol (VITAMIN D3) 25 MCG (1000 UNIT) tablet Take 1,000 Units by mouth daily.   Yes [provider]  Coenzyme Q10 (CO Q 10) 100 MG CAPS Take 100 mg by mouth daily.   Yes [provider]  Docusate Sodium (DSS) 100 MG CAPS Take 100 mg by mouth daily as needed.    Yes [provider]  ELIQUIS 5 MG TABS tablet Take 5 mg by mouth 2 (two) times daily. 09/27/19  Yes [provider]  L-Lysine 500 MG TABS Take 250 mg by mouth in the morning and at bedtime.    Yes [provider]  magnesium oxide (MAG-OX) 400 MG tablet Take 200 mg by mouth 2 (two) times daily.   Yes [provider]  Multiple Vitamin (MULTIVITAMIN WITH MINERALS) TABS tablet Take 1 tablet by mouth daily.   Yes [provider]  Nutritional  Supplements (DHEA PO) Take 1 tablet by mouth daily.   Yes [provider]  omega-3 acid ethyl esters (LOVAZA) 1 g capsule Take 1 capsule (1 g total) by mouth daily. Patient taking differently: Take 1 g by mouth 2 (two) times daily.  08/15/19  Yes Nicole Kindred A, DO  thiamine (VITAMIN B-1) 50 MG tablet Take 25 mg by mouth in the morning and at bedtime.    Yes [provider]  Vitamin D, Ergocalciferol, (DRISDOL) 1.25 MG (50000 UNIT) CAPS capsule Take 1 capsule (50,000 Units total) by mouth once a week. 08/21/19  Yes Nicole Kindred A, DO  vitamin E (VITAMIN E) 200 UNIT capsule Take  200 Units by mouth daily.   Yes [provider]  acetaminophen (TYLENOL) 500 MG tablet Take 1 tablet (500 mg total) by mouth every 4 (four) hours as needed for moderate pain. 10/02/19 10/01/20  Duffy Bruce, MD  colchicine 0.6 MG tablet Take 1 tablet (0.6 mg total) by mouth 2 (two) times daily. 10/02/19 10/01/20  Duffy Bruce, MD  doxycycline (VIBRAMYCIN) 100 MG capsule Take 1 capsule (100 mg total) by mouth 2 (two) times daily for 7 days. 10/02/19 10/09/19  Duffy Bruce, MD    Allergies Patient has no known allergies.  Family History  Problem Relation Age of Onset  . Non-Hodgkin's lymphoma Mother   . Heart attack Father     Social History Social History   Tobacco Use  . Smoking status: Never Smoker  . Smokeless tobacco: Never Used  Substance Use Topics  . Alcohol use: No  . Drug use: Never    Review of Systems  Review of Systems  Constitutional: Positive for fatigue. Negative for chills and fever.  HENT: Negative for sore throat.   Respiratory: Positive for chest tightness and shortness of breath.   Cardiovascular: Positive for chest pain.  Gastrointestinal: Negative for abdominal pain.  Genitourinary: Negative for flank pain.  Musculoskeletal: Negative for neck pain.  Skin: Negative for rash and wound.  Allergic/Immunologic: Negative for immunocompromised state.  Neurological: Negative for weakness and numbness.  Hematological: Does not bruise/bleed easily.  All other systems reviewed and are negative.    ____________________________________________  PHYSICAL EXAM:      VITAL SIGNS: ED Triage Vitals  Enc Vitals Group     BP 10/02/19 0811 110/71     Pulse Rate 10/02/19 0811 93     Resp 10/02/19 0811 19     Temp 10/02/19 0811 98.7 F (37.1 C)     Temp Source 10/02/19 0811 Oral     SpO2 10/02/19 0811 96 %     Weight 10/02/19 0812 157 lb (71.2 kg)     Height 10/02/19 0812 5\' 10"  (1.778 m)     Head Circumference --      Peak Flow --      Pain Score  10/02/19 0819 8     Pain Loc --      Pain Edu? --      Excl. in [place]? --      Physical Exam Vitals and nursing note reviewed.  Constitutional:      General: He is not in acute distress.    Appearance: He is well-developed.     Comments: Appears uncomfortable, clutching chest  HENT:     Head: Normocephalic and atraumatic.  Eyes:     Conjunctiva/sclera: Conjunctivae normal.  Cardiovascular:     Rate and Rhythm: Normal rate and regular rhythm.     Heart sounds: Normal heart  sounds. No murmur. No friction rub.  Pulmonary:     Effort: Pulmonary effort is normal. Tachypnea present. No respiratory distress.     Breath sounds: Normal breath sounds. No wheezing or rales.  Abdominal:     General: There is no distension.     Palpations: Abdomen is soft.     Tenderness: There is no abdominal tenderness.  Musculoskeletal:     Cervical back: Neck supple.  Skin:    General: Skin is warm.     Capillary Refill: Capillary refill takes less than 2 seconds.  Neurological:     Mental Status: He is alert and oriented to person, place, and time.     Motor: No abnormal muscle tone.       ____________________________________________   LABS (all labs ordered are listed, but only abnormal results are displayed)  Labs Reviewed  BASIC METABOLIC PANEL - Abnormal; Notable for the following components:      Result Value   Glucose, Bld 136 (*)    All other components within normal limits  CBC - Abnormal; Notable for the following components:   WBC 10.9 (*)    All other components within normal limits  C-REACTIVE PROTEIN - Abnormal; Notable for the following components:   CRP 4.0 (*)    All other components within normal limits  RESPIRATORY PANEL BY RT PCR (FLU A&B, COVID)  CULTURE, BLOOD (ROUTINE X 2)  CULTURE, BLOOD (ROUTINE X 2)  SEDIMENTATION RATE  PROCALCITONIN  TROPONIN I (HIGH SENSITIVITY)    ____________________________________________  EKG: Normal sinus rhythm, with ventricular  pacemaker in place.  Ventricular rate 90.  PR 166, QRS 152, QTc 44.  No acute ST elevations or depressions.  Diffuse T wave inversions in inferolateral leads. ________________________________________  RADIOLOGY All imaging, including plain films, CT scans, and ultrasounds, independently reviewed by me, and interpretations confirmed via formal radiology reads.  ED MD interpretation:   Chest x-ray: New left lung base opacity, favoring atelectasis versus pneumonia  Official radiology report(s): DG Chest 2 View  Result Date: 10/02/2019 CLINICAL DATA:  Chest pain and dyspnea EXAM: CHEST - 2 VIEW COMPARISON:  09/02/2019 chest radiograph. FINDINGS: Stable configuration of 2 lead left subclavian pacemaker. Mild cardiomegaly. Otherwise normal mediastinal contour. No pneumothorax. No right pleural effusion. Probable trace left pleural effusion. No overt pulmonary edema. New left lung base opacity silhouetting the left hemidiaphragm. IMPRESSION: 1. New left lung base opacity, favor atelectasis, cannot exclude pneumonia. Chest radiograph follow-up advised. 2. Probable trace left pleural effusion. 3. Mild cardiomegaly without overt pulmonary edema. Electronically Signed   By: Ilona Sorrel M.D.   On: 10/02/2019 09:11   CT Angio Chest PE W and/or Wo Contrast  Result Date: 10/02/2019 CLINICAL DATA:  Chest pain and shortness of breath EXAM: CT ANGIOGRAPHY CHEST WITH CONTRAST TECHNIQUE: Multidetector CT imaging of the chest was performed using the standard protocol during bolus administration of intravenous contrast. Multiplanar CT image reconstructions and MIPs were obtained to evaluate the vascular anatomy. CONTRAST:  41mL OMNIPAQUE IOHEXOL 350 MG/ML SOLN COMPARISON:  Chest radiograph October 02, 2019; chest CT December 13, 2008 FINDINGS: Cardiovascular: There is no demonstrable pulmonary embolus. There is no appreciable thoracic aortic aneurysm or dissection. Visualized great vessels appear unremarkable. Note that the  right innominate and left common carotid arteries arise as a common trunk, an anatomic variant. Left vertebral artery arises directly from the aortic arch, an anatomic variant. There are foci of coronary artery calcification. Pacemaker leads are attached to the right atrium and  right ventricle. There is a sizable pericardial effusion. The pericardium does not appear thickened. Mediastinum/Nodes: Thyroid appears unremarkable. No appreciable thoracic adenopathy. There appears to be a degree of esophageal wall thickening. Lungs/Pleura: There are areas of airspace consolidation in each lower lobe posteriorly as well as along the lateral aspect of the lingula. There is bibasilar atelectasis as well. Upper Abdomen: Prominent venous structures in the perisplenic region incompletely visualized. Visualized upper abdominal structures otherwise appear unremarkable. Musculoskeletal: No blastic or lytic bone lesions. Pacemaker device present on the left anteriorly. Chest wall otherwise unremarkable. Review of the MIP images confirms the above findings. IMPRESSION: 1. No demonstrable pulmonary embolus. No thoracic aortic aneurysm or dissection. There are foci of coronary artery calcification. Pacemaker leads attached to right atrium and right ventricle. 2.  Sizable pericardial effusion of uncertain etiology. 3. Foci of airspace consolidation in each posterior lung base and along the lateral lingular region. Bibasilar atelectasis as well. 4. There appears to be wall thickening throughout portions of the esophagus. Question underlying esophagitis. 5.  No evident adenopathy. 6. Prominent perisplenic venous structures, incompletely visualized. Question varices. Electronically Signed   By: Lowella Grip III M.D.   On: 10/02/2019 10:31   ECHOCARDIOGRAM LIMITED  Result Date: 10/02/2019    ECHOCARDIOGRAM LIMITED REPORT   Patient Name:   GENEVA LEAVELLE Date of Exam: 10/02/2019 Medical Rec #:  [id number]   Height:       70.0 in  Accession #:    [id number]  Weight:       157.0 lb Date of Birth:  June 28, 1950    BSA:          1.883 m Patient Age:    24 years    BP:           114/88 mmHg Patient Gender: M           HR:           89 bpm. Exam Location:  [facility] Procedure: Limited Echo, Cardiac Doppler and Color Doppler Indications:     Pericardial effusion 423.9  History:         Patient has prior history of Echocardiogram examinations, most                  recent 08/14/2019. No cardiac history listed in chart.  Sonographer:     Sherrie Sport RDCS (AE) Referring Phys:  [place] Diagnosing Phys: Bartholome Bill MD IMPRESSIONS  1. Left ventricular ejection fraction by PLAX is 72 %. The left ventricle has normal function. The left ventricle has no regional wall motion abnormalities. There is moderate left ventricular hypertrophy. Left ventricular diastolic parameters were normal.  2. Right ventricular systolic function is normal. The right ventricular size is normal.  3. Small to moderate pericardial effusion, largest posterior.. The pericardial effusion is posterior to the left ventricle and circumferential. There is no evidence of cardiac tamponade.  4. The aortic valve is tricuspid. FINDINGS  Left Ventricle: Left ventricular ejection fraction by PLAX is 72 %. The left ventricle has normal function. The left ventricle has no regional wall motion abnormalities. The left ventricular internal cavity size was normal in size. There is moderate left ventricular hypertrophy. Right Ventricle: The right ventricular size is normal. No increase in right ventricular wall thickness. Right ventricular systolic function is normal. Left Atrium: Left atrial size was normal in size. Right Atrium: Right atrial size was normal in size. Pericardium: Small to moderate pericardial effusion, largest posterior. The pericardial effusion is  posterior to the left ventricle and circumferential. There is no evidence of cardiac tamponade. Aortic Valve: The aortic valve is  tricuspid. IAS/Shunts: The interatrial septum was not assessed.  LEFT VENTRICLE PLAX 2D LV EF:         Left ventricular ejection fraction by PLAX is 72 %. LVIDd:         3.56 cm LVIDs:         2.12 cm LV PW:         1.22 cm LV IVS:        0.86 cm LVOT diam:     2.20 cm LVOT Area:     3.80 cm  RIGHT VENTRICLE RV Basal diam:  3.30 cm RV S prime:     15.10 cm/s TAPSE (M-mode): 4.7 cm LEFT ATRIUM         Index LA diam:    3.40 cm 1.81 cm/m                        PULMONIC VALVE AORTA                 PV Vmax:        0.77 m/s Ao Root diam: 3.10 cm PV Peak grad:   2.4 mmHg                       RVOT Peak grad: 3 mmHg   SHUNTS Systemic Diam: 2.20 cm Bartholome Bill MD Electronically signed by Bartholome Bill MD Signature Date/Time: 10/02/2019/2:17:22 PM    Final     ____________________________________________  PROCEDURES   Procedure(s) performed (including Critical Care):  .1-3 Lead EKG Interpretation Performed by: Duffy Bruce, MD Authorized by: Duffy Bruce, MD     Interpretation: non-specific     ECG rate:  60s   ECG rate assessment: normal     Rhythm: paced     Ectopy: none     Conduction: abnormal   Comments:     Indication: Pericarditis    ____________________________________________  INITIAL IMPRESSION / MDM / ASSESSMENT AND PLAN / ED COURSE  As part of my medical decision making, I reviewed the following data within the [place] notes reviewed and incorporated, Old chart reviewed, Notes from prior ED visits, and [place] Controlled Substance Database       *Devin Hale was evaluated in Emergency Department on 10/02/2019 for the symptoms described in the history of present illness. He was evaluated in the context of the global COVID-19 pandemic, which necessitated consideration that the patient might be at risk for infection with the SARS-CoV-2 virus that causes COVID-19. Institutional protocols and algorithms that pertain to the evaluation of patients at risk  for COVID-19 are in a state of rapid change based on information released by regulatory bodies including the CDC and federal and state organizations. These policies and algorithms were followed during the patient's care in the ED.  Some ED evaluations and interventions may be delayed as a result of limited staffing during the pandemic.*     Medical Decision Making:  69 yo M with PMHx as above including recent PM placement at [facility] complicated by post-insertion pericarditis, here with positional, severe chest pain. 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 EKG shows no ischemic changes and recent catheterization showed non-occlusive disease. CT Angio is without signs of PE. He does have foci of consolidation on his lungs, which I suspect is related to atelectasis due to his pericarditis pain and hypoventilation,  but will cover empirically to be safe. Consulted Dr. Ubaldo Glassing of cardiology who spent a significant amount of time with pt at bedside, and has reviewed his labs, imaging, and echocardiogram. No signs of tamponade. 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   Per Dr. Ubaldo Glassing, from a pericarditis standpoint he can safely be managed as an outpatient with no indication for admission. He has normal troponin, stable vitals and no signs of myocarditis or tamponade. Per Cards, will start on Colchicine BID and tylenol PRN. Will also Rx doxycycline in event of possible infectious component. He is satting well on RA and in NAD, doubt significant bacterial PNA. Will send in colchicine, doxy and refer for outpatient follow-up with Dr. Saralyn Pilar. Greatly appreciate cardiology recommendations.  ____________________________________________  FINAL CLINICAL IMPRESSION(S) / ED DIAGNOSES  Final diagnoses:  Acute pericarditis, unspecified type  Community acquired pneumonia, unspecified laterality     MEDICATIONS GIVEN DURING THIS VISIT:  Medications  colchicine  tablet 0.6 mg (0.6 mg Oral Given 10/02/19 1330)  acetaminophen (TYLENOL) tablet 650 mg (650 mg Oral Given 10/02/19 1330)  fentaNYL (SUBLIMAZE) injection 50 mcg (50 mcg Intravenous Refused 10/02/19 1614)  sodium chloride flush (NS) 0.9 % injection 3 mL (3 mLs Intravenous Given 10/02/19 0911)  fentaNYL (SUBLIMAZE) injection 50 mcg (50 mcg Intravenous Given 10/02/19 0952)  ondansetron (ZOFRAN) injection 4 mg (4 mg Intravenous Given 10/02/19 0951)  iohexol (OMNIPAQUE) 350 MG/ML injection 75 mL (75 mLs Intravenous Contrast Given 10/02/19 1007)  cefTRIAXone (ROCEPHIN) 2 g in sodium chloride 0.9 % 100 mL IVPB (0 g Intravenous Stopped 10/02/19 1437)  azithromycin (ZITHROMAX) 500 mg in sodium chloride 0.9 % 250 mL IVPB (0 mg Intravenous Stopped 10/02/19 1514)  sodium chloride 0.9 % bolus 500 mL (0 mLs Intravenous Stopped 10/02/19 1614)     ED Discharge Orders         Ordered    colchicine 0.6 MG tablet  2 times daily     10/02/19 1541    doxycycline (VIBRAMYCIN) 100 MG capsule  2 times daily     10/02/19 1541    acetaminophen (TYLENOL) 500 MG tablet  Every 4 hours PRN     10/02/19 1541           Note:  This document was prepared using Dragon voice recognition software and may include unintentional dictation errors.   Duffy Bruce, MD 10/02/19 Karie Fetch, MD 10/02/19 1827

## 2019-10-02 NOTE — ED Triage Notes (Signed)
Pt c/o substernal chest pain radiating into "my throat" since last night with SOB. Pt is moaning in triage, states "it hurts". VSS,.

## 2019-10-02 NOTE — Progress Notes (Signed)
*  PRELIMINARY RESULTS* Echocardiogram 2D Echocardiogram has been performed.  Devin Hale 10/02/2019, 1:46 PM

## 2019-10-04 ENCOUNTER — Ambulatory Visit: Payer: 59 | Admitting: Occupational Therapy

## 2019-10-04 DIAGNOSIS — I3139 Other pericardial effusion (noninflammatory): Secondary | ICD-10-CM | POA: Insufficient documentation

## 2019-10-04 DIAGNOSIS — I313 Pericardial effusion (noninflammatory): Secondary | ICD-10-CM | POA: Insufficient documentation

## 2019-10-07 LAB — CULTURE, BLOOD (ROUTINE X 2)
Culture: NO GROWTH
Culture: NO GROWTH
Special Requests: ADEQUATE
Special Requests: ADEQUATE

## 2019-10-11 ENCOUNTER — Encounter: Payer: Self-pay | Admitting: Occupational Therapy

## 2019-10-11 ENCOUNTER — Other Ambulatory Visit: Payer: Self-pay

## 2019-10-11 ENCOUNTER — Ambulatory Visit: Payer: 59 | Attending: Acute Care | Admitting: Occupational Therapy

## 2019-10-11 DIAGNOSIS — Q681 Congenital deformity of finger(s) and hand: Secondary | ICD-10-CM | POA: Diagnosis present

## 2019-10-11 DIAGNOSIS — M6281 Muscle weakness (generalized): Secondary | ICD-10-CM

## 2019-10-11 DIAGNOSIS — M25641 Stiffness of right hand, not elsewhere classified: Secondary | ICD-10-CM | POA: Insufficient documentation

## 2019-10-11 NOTE — Therapy (Signed)
[facility] [address], [place], [address] Phone: [phone]   Fax:  [phone]  Occupational Therapy Evaluation  Patient Details  Name: Devin Hale MRN: [id number] Date of Birth: 04-18-1951 Referring Provider (OT): Benjamin Stain   Encounter Date: 10/11/2019  OT End of Session - 10/11/19 1419    Visit Number  1    Number of Visits  6    Date for OT Re-Evaluation  11/22/19    OT Start Time  1136    OT Stop Time  1238    OT Time Calculation (min)  62 min    Activity Tolerance  Patient tolerated treatment well    Behavior During Therapy  [facility] for tasks assessed/performed       Past Medical History:  Diagnosis Date  . Hepatitis   . Hepatitis C     Past Surgical History:  Procedure Laterality Date  . KNEE ARTHROSCOPY Left 1982  . TEMPORARY PACEMAKER N/A 08/13/2019   Procedure: TEMPORARY PACEMAKER;  Surgeon: [name], MD;  Location: [facility];  Service: Cardiovascular;  Laterality: N/A;  . TONSILLECTOMY      There were no vitals filed for this visit.  Subjective Assessment - 10/11/19 1405    Subjective   My health has just been going down hill the last 18 months - I was suppose to see you in January -but then I ended up having pacemaker surgery , - my R thumb is deformed  and I cannot use it , and L is also going down hill    Pertinent History  Devin Hale is a 69 y.o. 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 Discussed with patient that he has multiple symptoms and I suspect these are due to multiple etiologies. His joint pain seems more consistent with osteoarthritis. However, his weakness, atrophy, and fatigue raises concern in my mind for a myopathic process. No rash or sun sensitivity and normal muscle enzymes make inflammatory  myositis such as polymyositis or dermatomyositis less likely. Discussed with patient that I am concerned about inclusion body myositis. He has not had an MRI of his thighs or muscle biopsy. He saw neurology 01/23/2019 and felt he did not have a neuromuscular disorder and felt more likely he had peripheral neuropathy (risk factors including prior hep C and medications used to treat his hep C) and deconditioning. Had 08/11/19 cardiac cath and pacemaker 08/15/2019 and then in ED again 10/02/2019 with recurrent pleuritis chest pain    Patient Stated Goals  I want to be able to use my thumbs and not have them look so bad    Currently in Pain?  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[employer] , [place] , Haematologist and outside [employer]  Hand AROM   R Thumb MCP 0-60  --   hyper extention , 20 flexion if block   R Thumb IP 0-80  90 Degrees   if MP block can do extention to neutral    R Thumb Radial ABduction/ADduction 0-55  --   CMC collapse , hyper ext of MC    R Thumb Palmar ABduction/ADduction 0-45  --   CMC adducted , MC hyper extend     Left Hand AROM   L Thumb MCP 0-60  --   impaired   L Thumb IP 0-80  80 Degrees    L Thumb Radial ADduction/ABduction 0-55  --   hyper extend with MC - need AAROM fo CMC   L Thumb Palmar ADduction/ABduction 0-45  45          Pt fitted with prefab thumb spica to R hand to use until he seen hand surgeon  Keeping middle phalanges supported and able to flex but extend also IP   L thumb PA using rubber band for 10 reps AAROM for RA of thumb - sliding on paper 10 reps Keep thumb ADD and rotate hand from palm  down to ulnar side - without collapsing thumb into palm Opposition gentle to all digits without collapsing CMC or thenar into palm  10 reps  2 x day             OT Education - 10/11/19 1418    Education Details  findings of eval and HEP    Person(s) Educated  Patient    Methods  Explanation;Demonstration;Tactile cues;Verbal cues    Comprehension  Verbal cues required;Returned demonstration;Verbalized understanding          OT Long Term Goals - 10/11/19 1426      OT LONG TERM GOAL #1   Title  Pt to be independent in HEP to increase strength in L thumb to stabilze thumb during prehension grip and to push buttons or lift  objects with wide grip with thumb    Baseline  hyper extention of MC with RA , decrease strength PA , athropy thenar eminence    Time  6    Period  Weeks    Status  New    Target Date  11/22/19      OT LONG TERM GOAL #2   Title  Assess grip and prehension strength in bilateral hands for baseline    Baseline  NT    Time  6    Period  Weeks    Status  New    Target Date  11/22/19            Plan - 10/11/19 1420    Clinical Impression Statement  Pt present at OT eval for OA of bilateral thumbs with R worse than L - pt was refer in Nov 2020 -but had in the meantime pacemaker surgery and then ED visit with recurrent pleuritis chest pain - pt report thumb deformities in R hand developed about 18 months ago - pt show decrease CMC AROM, Adducted and MC hyper extention with IP in flexoin - unable to extend  - no strength or stability in R thumb - pt fit with prefab thumb spica to use until he sees  hand surgeon for consult - L thumb show some hyper extention of MC during loading pinch grip and RA - but definitely weak - provided pt with some strenghtening HEP to L - and recommend for pt to ask about cardiac Rehab referral  if appropriate. Pt can benefit from OT services for strenghtening in the meantime L thumb to be albe to increase independency in ADL's and  IADL's    OT Occupational Profile and History  Problem Focused Assessment - Including review of records relating to presenting problem    Occupational performance deficits (Please refer to evaluation for details):  ADL's;IADL's;Leisure;Play    Body Structure / Function / Physical Skills  ADL;Flexibility;Decreased knowledge of use of DME;ROM;UE functional use;Pain;Strength;IADL    Rehab Potential  Fair    Clinical Decision Making  Limited treatment options, no task modification necessary    Comorbidities Affecting Occupational Performance:  Presence of comorbidities impacting occupational performance   Pt has a lot of medical issues   Modification or Assistance to Complete Evaluation   Min-Moderate modification of tasks or assist with assess necessary to complete eval    OT Frequency  1x / week    OT Duration  6 weeks    OT Treatment/Interventions  Self-care/ADL training;DME and/or AE instruction;Manual Therapy;Splinting;Patient/family education;Therapeutic exercise    Plan  assess progress with HEP L thumb    OT Home Exercise Plan  see pt instruction    Consulted and Agree with Plan of Care  Patient       Patient will benefit from skilled therapeutic intervention in order to improve the following deficits and impairments:   Body Structure / Function / Physical Skills: ADL, Flexibility, Decreased knowledge of use of DME, ROM, UE functional use, Pain, Strength, IADL       Visit Diagnosis: Thumb in palm deformity - Plan: Ot plan of care cert/re-cert  Muscle weakness (generalized) - Plan: Ot plan of care cert/re-cert  Stiffness of right hand, not elsewhere classified - Plan: Ot plan of care cert/re-cert    Problem List Patient Active Problem List   Diagnosis Date Noted  . AV block, 3rd degree ([place]) 08/13/2019  . 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 Chronic venous insufficiency 08/13/2018  . Hepatitis C 07/28/2018  . Swelling of limb 07/28/2018  .  Osteoarthritis of both knees 11/23/2017    Rosalyn Gess OTR/l,CLT 10/11/2019, 2:30 PM  [facility] [address], [place], [address] Phone: [phone]   Fax:  [phone]  Name: Avanish Wiemken MRN: [id number] Date of Birth: [birth year]

## 2019-10-11 NOTE — Patient Instructions (Signed)
Pt fitted with prefab thumb spica to R hand to use until he seen hand surgeon  L thumb PA using rubber band for 10 reps AAROM for RA of thumb - sliding on paper 10 reps Keep thumb ADD and rotate hand from palm down to ulnar side - without collapsing thumb into palm Opposition gentle to all digits without collapsing CMC or thenar into palm  10 reps  2 x day

## 2019-10-15 ENCOUNTER — Ambulatory Visit: Admit: 2019-10-15 | Discharge: 2019-10-16 | Payer: MEDICARE | Attending: Family | Primary: Family

## 2019-10-15 DIAGNOSIS — K7460 Unspecified cirrhosis of liver: Principal | ICD-10-CM

## 2019-10-15 DIAGNOSIS — K769 Liver disease, unspecified: Principal | ICD-10-CM

## 2019-10-18 ENCOUNTER — Encounter: Payer: 59 | Admitting: Occupational Therapy

## 2019-11-12 DIAGNOSIS — R531 Weakness: Principal | ICD-10-CM

## 2019-12-25 ENCOUNTER — Encounter: Payer: 59 | Admitting: Internal Medicine

## 2019-12-27 ENCOUNTER — Encounter: Admit: 2019-12-27 | Discharge: 2019-12-28 | Payer: MEDICARE

## 2019-12-31 DIAGNOSIS — R531 Weakness: Principal | ICD-10-CM

## 2020-01-06 DIAGNOSIS — T82897S Other specified complication of cardiac prosthetic devices, implants and grafts, sequela: Secondary | ICD-10-CM | POA: Insufficient documentation

## 2020-01-09 DIAGNOSIS — K7460 Unspecified cirrhosis of liver: Principal | ICD-10-CM

## 2020-01-22 ENCOUNTER — Encounter: Admit: 2020-01-22 | Discharge: 2020-01-23 | Payer: MEDICARE

## 2020-01-22 DIAGNOSIS — R531 Weakness: Principal | ICD-10-CM

## 2020-01-22 DIAGNOSIS — M1811 Unilateral primary osteoarthritis of first carpometacarpal joint, right hand: Principal | ICD-10-CM

## 2020-01-25 DIAGNOSIS — M1811 Unilateral primary osteoarthritis of first carpometacarpal joint, right hand: Principal | ICD-10-CM

## 2020-01-29 ENCOUNTER — Other Ambulatory Visit: Payer: Self-pay

## 2020-01-29 ENCOUNTER — Encounter: Payer: Self-pay | Admitting: Internal Medicine

## 2020-01-29 ENCOUNTER — Ambulatory Visit (INDEPENDENT_AMBULATORY_CARE_PROVIDER_SITE_OTHER): Payer: 59 | Admitting: Internal Medicine

## 2020-01-29 VITALS — BP 110/80 | HR 70 | Ht 70.5 in | Wt 169.0 lb

## 2020-01-29 DIAGNOSIS — I442 Atrioventricular block, complete: Secondary | ICD-10-CM | POA: Diagnosis not present

## 2020-01-29 DIAGNOSIS — Z950 Presence of cardiac pacemaker: Secondary | ICD-10-CM | POA: Diagnosis not present

## 2020-01-29 NOTE — Progress Notes (Signed)
ELECTROPHYSIOLOGY OFFICE NOTE  Patient ID: Devin Hale, MRN: [id number], DOB/AGE: [birth year] 67 y.o. Admit date: (Not on file) Date of Consult: 01/29/2020  Primary Physician: Theotis Burrow, MD Primary Cardiologist:  [facility]*     Devin Hale is a 69 y.o. male who is being seen today for the evaluation of pacemaker at his request   HPI Devin Hale is a 69 y.o. male presents for pacemaker follow-up.  I saw him prior to his decision to be transferred to [name]  Presented February 2021 with high-grade heart block.  Temporary pacemaker was placed and the patient was transferred to [name] he underwent His bundle pacing [facility]) pacemaker implantation St Jude complicated by atrial lead dislodgment as well as pericarditis for which he received colchicine for a month.  Also developed postprocedural atrial fibrillation and started on Eliquis  Evaluation also demonstrated unipolar pacing and pocket stimulation during backup pulsing.  Programmed to bipolar pacing  He desires to reestablish care here   DATE TEST EF   2/21 LHC    % Nonobstructive Cx/RCA  4/21 Echo  55-65% Small mod pericardial effusion   Date Cr K Hgb  4/21 0.89 4.5 14.5    Past medical history notable for chronic hepatitis C followed at [facility].  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  Past Medical History:  Diagnosis Date  . Hepatitis   . Hepatitis C       Surgical History:  Past Surgical History:  Procedure Laterality Date  . KNEE ARTHROSCOPY Left 1982  . TEMPORARY PACEMAKER N/A 08/13/2019   Procedure: TEMPORARY PACEMAKER;  Surgeon: [name], MD;  Location: [facility];  Service: Cardiovascular;  Laterality: N/A;  . TONSILLECTOMY       Home Meds: Current Meds  Medication Sig  . acetaminophen (TYLENOL) 500 MG  tablet Take 1 tablet (500 mg total) by mouth every 4 (four) hours as needed for moderate pain.  [name] alendronate (FOSAMAX) 70 MG tablet Take 70 mg by mouth once a week.  [name] ascorbic acid (VITAMIN C) 500 MG tablet Take 500 mg by mouth 2 (two) times daily.   [name] aspirin EC 81 MG EC tablet Take 1 tablet (81 mg total) by mouth daily.  [name] atorvastatin (LIPITOR) 40 MG tablet Take 40 mg by mouth daily.  . cholecalciferol (VITAMIN D3) 25 MCG (1000 UNIT) tablet Take 1,000 Units by mouth in the morning and at bedtime.   . Coenzyme Q10 (CO Q 10) 100 MG CAPS Take 100 mg by mouth daily.  . Docosahexaenoic Acid (DHA COMPLETE PO) Take 500 mg by mouth daily.  [name] ELIQUIS 5 MG TABS tablet Take 5 mg by mouth 2 (two) times daily.  [name] L-Lysine 500 MG TABS Take 250 mg by mouth in the morning and at bedtime.   . magnesium oxide (MAG-OX) 400 MG tablet Take 200 mg by mouth 2 (two) times daily.  . Multiple Vitamin (MULTIVITAMIN WITH MINERALS) TABS tablet Take 1 tablet by mouth daily.  . Nutritional Supplements (DHEA PO) Take 1 tablet by mouth daily.  . Omega-3 Fatty Acids (FISH OIL) 1000 MG CAPS Take by mouth daily.  . pregabalin (LYRICA) 25 MG capsule pregabalin 25 mg capsule  TAKE 1 NIGHTLY FOR 1 WEEK THEN 1 TWICE DAILY FOR 1 WEEK THEN 1 THREE TIMES DAILY  . vitamin E (VITAMIN E) 200  UNIT capsule Take 200 Units by mouth daily.    Allergies: No Known Allergies  Social History   Socioeconomic History  . Marital status: Single    Spouse name: Not on file  . Number of children: Not on file  . Years of education: Not on file  . Highest education level: Not on file  Occupational History  . Not on file  Tobacco Use  . Smoking status: Never Smoker  . Smokeless tobacco: Never Used  Vaping Use  . Vaping Use: Never used  Substance and Sexual Activity  . Alcohol use: No  . Drug use: Never  . Sexual activity: Not on file  Other Topics Concern  . Not on file  Social History Narrative  . Not on file   Social  Determinants of Health   Financial Resource Strain:   . Difficulty of Paying Living Expenses:   Food Insecurity:   . Worried About Charity fundraiser in the Last Year:   . Arboriculturist in the Last Year:   Transportation Needs:   . Film/video editor (Medical):   [name] Lack of Transportation (Non-Medical):   Physical Activity:   . Days of Exercise per Week:   . Minutes of Exercise per Session:   Stress:   . Feeling of Stress :   Social Connections:   . Frequency of Communication with Friends and Family:   . Frequency of Social Gatherings with Friends and Family:   . Attends Religious Services:   . Active Member of Clubs or Organizations:   . Attends Archivist Meetings:   [name] Marital Status:   Intimate Partner Violence:   . Fear of Current or Ex-Partner:   . Emotionally Abused:   [name] Physically Abused:   . Sexually Abused:      Family History  Problem Relation Age of Onset  . Non-Hodgkin's lymphoma Mother   . Heart attack Father      ROS:  Please see the history of present illness.     All other systems reviewed and negative.    Physical Exam: Blood pressure 110/80, pulse 70, height 5' 10.5" (1.791 m), weight 169 lb (76.7 kg), SpO2 98 %. General: Well developed, well nourished male in no acute distress. Head: Normocephalic, atraumatic, sclera non-icteric, no xanthomas, nares are without discharge. EENT: normal  Lymph Nodes:  none Neck: Negative for carotid bruits. JVD not elevated. Back:without scoliosis kyphosis  Device pocket well healed; without hematoma or erythema.  There is no tethering  Lungs: Clear bilaterally to auscultation without wheezes, rales, or rhonchi. Breathing is unlabored. Heart: RRR with S1 S2. No  murmur . No rubs, or gallops appreciated. Abdomen: Soft, non-tender, non-distended with normoactive bowel sounds. No hepatomegaly. No rebound/guarding. No obvious abdominal masses. Msk:  Strength and tone appear normal for age. Extremities:  No clubbing or cyanosis.  edema.  Distal pedal pulses are 2+ and equal bilaterally.  Unusual hand Skin: Warm and Dry Neuro: Alert and oriented X 3. CN III-XII intact Grossly normal sensory and motor function . Psych:  Responds to questions appropriately with a normal affect.      Labs: Cardiac Enzymes No results for input(s): CKTOTAL, CKMB, TROPONINI in the last 72 hours. CBC Lab Results  Component Value Date   WBC 10.9 (H) 10/02/2019   HGB 14.5 10/02/2019   HCT 43.3 10/02/2019   MCV 88.0 10/02/2019   PLT 165 10/02/2019   PROTIME: No results for input(s): LABPROT, INR in the last  72 hours. Chemistry No results for input(s): NA, K, CL, CO2, BUN, CREATININE, CALCIUM, PROT, BILITOT, ALKPHOS, ALT, AST, GLUCOSE in the last 168 hours.  Invalid input(s): LABALBU Lipids No results found for: CHOL, HDL, LDLCALC, TRIG BNP No results found for: PROBNP Thyroid Function Tests: No results for input(s): TSH, T4TOTAL, T3FREE, THYROIDAB in the last 72 hours.  Invalid input(s): FREET3 Miscellaneous No results found for: DDIMER  Radiology/Studies:  No results found.  EKG: Sinus with fusion   Assessment and Plan:  Complete heart block-intermittent  Pacemaker-Saint Jude with a [employer] His lead  Pericarditis post atrial lead microperforation/dislodgment  Atrial fibrillation postprocedure  Hepatitis C  Neuromuscular issue currently undiagnosed   Patient's pacemaker is functioning normally.  We are left with a question as to why he developed complete heart block.  Will defer further evaluation until we have the results of his evaluation at the [facility] clinic.  Would recommend cMRI to exclude sarcoid and amyloid as well as consideration of Lyme.  Atrial fibrillation with post procedure.  He had some atrial fibrillation about a month later.  In the event that he has no further interval atrial fibrillation would consider discontinuing his anticoagulation    Virl Axe

## 2020-01-29 NOTE — Patient Instructions (Signed)

## 2020-01-30 ENCOUNTER — Telehealth: Payer: Self-pay | Admitting: Internal Medicine

## 2020-01-30 NOTE — Telephone Encounter (Signed)
Patient has some questions regarding his MRI that he and Dr. Caryl Comes discussed. Please call to discuss.

## 2020-01-30 NOTE — Telephone Encounter (Signed)
Attempted to call the patient. No answer- I left a message to please call back.  

## 2020-01-31 NOTE — Telephone Encounter (Signed)
Patient returning call.

## 2020-02-01 ENCOUNTER — Other Ambulatory Visit: Payer: Self-pay

## 2020-02-01 MED ORDER — ATORVASTATIN CALCIUM 40 MG PO TABS: 40.0000 mg | ORAL_TABLET | Freq: Every day | ORAL | 0 refills | Status: DC

## 2020-02-04 NOTE — Telephone Encounter (Signed)
Called patient and left a VM that we will try and reach him again tomorrow.

## 2020-02-04 NOTE — Telephone Encounter (Signed)
Patient is returning the call again.

## 2020-02-05 NOTE — Telephone Encounter (Signed)
I spoke with the patient. He had several things he wanted to discuss:  1) He wanted Dr. Olin Pia input about a 3rd vaccine for COVID-19. - he thought with his heart condition, he may be considered in the "high risk" category - he wanted input as to whether he should wait a full 8 months to have this done, or have this sooner - his 2nd dose Moderna vaccine was on 08/22/19  I advised the patient I would need to review with Dr. Caryl Comes as to what his thoughts are as this data/ recommendation is so new  2) He wanted to clarify about the Cardiac MRI - he wanted all of his care to be with Dr. Caryl Comes due to location - he wanted to also discuss again why this was being done - we discussed that Dr. Caryl Comes wanted to rule out a possible cause for his complete heart block and was looking at amyloid/ sarcoid/ lyme's disease - the patient is aware the Dr. 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 - he is taking PT at [facility] on [employer] [address]) - "trigger point dry needling" has been proposed to him - the patient states this is like accupuncture with a fine needle injected into the muscle of the gluteus medias - PT will not perform this without an ok from Dr. Caryl Comes due to his eliquis  I have advised the patient I need to review the above information with Dr. Caryl Comes a little further and will try to call him back later this week with MD recommendations/ feedback.  The patient voices understanding and is agreeable.

## 2020-02-07 NOTE — Telephone Encounter (Signed)
Quick literature review clearly is demonstrating the safety of acupuncture needling with anticoagulation.  I could find no literature regarding dry needling.  I think it is reasonable to assume if the levels are about the same size that it would be safe to do with dry needling.  If the needles are not the same size and the dry needling needles are larger then we will need to reassess as there is no literature to guide [place]

## 2020-02-12 NOTE — Telephone Encounter (Signed)
As best as we can find out, acupuncture has been done safely with anticoagulation.  If the needles are the same I would presume it could also be done safely.

## 2020-02-12 NOTE — Telephone Encounter (Signed)
I called and spoke with Devin Hale, PT for the patient. [phone] ([address]  I advised him of Dr. Olin Pia comments regarding dry needing. Per Devin Hale, the needle is the same size as an accupuncture needle. He states he will not puncture the site more than 3 times.  He did not feel it was worth the patient stopping his eliquis to do this procedure and he has reviewed this with the patient. I advised Devin Hale that I will review further with Dr. Caryl Comes and fax him an ok if Dr. Caryl Comes is agreeable to proceed with dry needing in anticoagulation. I advised he and the patient can then discuss this further. Devin Hale was agreeable.   Fax # [phone].  I have also called the patient and advised him of the above recommendations from Dr. Caryl Comes.  He is aware I will review with Dr. Caryl Comes again this week to see if we can formulate a letter that it is ok to proceed with this. If so, he is aware I will ask Devin Hale, PT to notify him when this is received.  He voices understanding and is agreeable.   I have advised him that per Dr. Caryl Comes, he will need to wait the full 8 months from his 2nd dose vaccine to have his 3rd booster.   I inquired if he heard from Dr. Westley Gambles about the Cardiac MRI and he advised he has not.  He will call Dr. Rosalita Chessman office tomorrow to follow up. I have asked that he call back and let me know if he had any issues with getting the MRI scheduled.   The patient

## 2020-02-12 NOTE — Telephone Encounter (Signed)
Patient has called inquiring about his return phone call.

## 2020-02-14 ENCOUNTER — Encounter: Payer: Self-pay | Admitting: *Deleted

## 2020-02-14 NOTE — Telephone Encounter (Signed)
I spoke with the patient. He states he spoke with Dr. Rosalita Chessman office.  He reports there was notification at Dr. Rosalita Chessman office that he and Dr. Caryl Comes had spoken on 01/30/20 about the need for a Cardiac MRI. Per the patient, this fell through the cracks on their end. He is now awaiting a call back from scheduling with a date for his Cardiac MRI. He will notify us when this is scheduled/ once this has been completed.

## 2020-02-14 NOTE — Telephone Encounter (Signed)
I attempted to call the patient back. No answer- I left a message to please call back.  I also faxed a letter to Nicki Reaper- PT with [facility] at [phone] with Dr. Olin Pia statement as below regarding the dry needling.   Confirmation received.

## 2020-02-14 NOTE — Telephone Encounter (Signed)
Patient would like a return call regarding the MRI at [facility].

## 2020-02-18 ENCOUNTER — Telehealth: Payer: Self-pay | Admitting: Internal Medicine

## 2020-02-18 NOTE — Telephone Encounter (Signed)
Patient wants [name] to be aware 9/14 at noon patient is having cardiac MRI at [facility]  Patient is curious is there is anything he needs to know or do before that appointment.  Please advise if needed

## 2020-02-19 NOTE — Telephone Encounter (Signed)
Attempted to call the patient. No answer- I left a message I received his message from earlier today and will follow up with him after the Cardiac MRI is completed and reviewed with Dr. Caryl Comes.  I advised that otherwise, there is nothing to be aware of in the interim, but to please call back if needed.

## 2020-02-26 ENCOUNTER — Telehealth: Payer: Self-pay | Admitting: *Deleted

## 2020-02-28 ENCOUNTER — Telehealth: Payer: Self-pay | Admitting: Internal Medicine

## 2020-02-28 NOTE — Telephone Encounter (Signed)
I spoke with the patient. He states he just received a robo call from [place] stating he has a transmission due on 03/06/20. The patient has recently established with Dr. Caryl Comes and needs to have all of his information his device transferred to our clinic.  I have advised him I will reach out to our [facility] so they can request this to be done.  He would like to be notified when this has been taken care of.

## 2020-02-28 NOTE — Telephone Encounter (Signed)
Returned Mr. Ballengee phone call in regards to monitoring within our device clinic.   No answer, LMVOM with direct number.

## 2020-02-28 NOTE — Telephone Encounter (Signed)
Patient calling in to talk with nurse about recent [place] [facility] cardiology call he received

## 2020-02-29 NOTE — Telephone Encounter (Signed)
Patient returning call.

## 2020-02-29 NOTE — Telephone Encounter (Signed)
Spoke with patient and made him aware that transfer request has been made in [facility] for his monitoring from [facility] clinic. Confirmed that his remote home monitor is plugged in and aware that until he is transferred to [facility] clinic that his device alerts will be seen and received by [facility]. 91 day remotes scheduled in [employer] Spoke with [facility]( Dr [facility] office) and left message to release patient to our clinic in [place]. DC # provided for any questions.

## 2020-03-04 ENCOUNTER — Telehealth: Payer: Self-pay

## 2020-03-04 NOTE — Telephone Encounter (Signed)
Patient calling to let office know his mri is complete

## 2020-03-05 NOTE — Telephone Encounter (Signed)
I reviewed the patient's chart. His Cardiac MRI results have not yet posted in Care Everywhere.  Will continue to check for results to become available then forward a message to Dr. Caryl Comes to please review.

## 2020-03-07 ENCOUNTER — Ambulatory Visit (INDEPENDENT_AMBULATORY_CARE_PROVIDER_SITE_OTHER): Payer: 59 | Admitting: *Deleted

## 2020-03-07 DIAGNOSIS — I442 Atrioventricular block, complete: Secondary | ICD-10-CM

## 2020-03-07 LAB — CUP PACEART REMOTE DEVICE CHECK
Battery Remaining Longevity: 96 mo
Battery Remaining Percentage: 95.5 %
Battery Voltage: 2.99 V
Brady Statistic AP VP Percent: 20 %
Brady Statistic AP VS Percent: 1 %
Brady Statistic AS VP Percent: 78 %
Brady Statistic AS VS Percent: 1 %
Brady Statistic RA Percent Paced: 18 %
Brady Statistic RV Percent Paced: 97 %
Date Time Interrogation Session: 20210917020013
Implantable Lead Implant Date: 20210224
Implantable Lead Implant Date: 20210224
Implantable Lead Location: 753859
Implantable Lead Location: 753860
Implantable Lead Model: 7842
Implantable Lead Serial Number: 1032895
Implantable Pulse Generator Implant Date: 20210224
Lead Channel Impedance Value: 460 Ohm
Lead Channel Impedance Value: 530 Ohm
Lead Channel Pacing Threshold Amplitude: 0.875 V
Lead Channel Pacing Threshold Amplitude: 1.25 V
Lead Channel Pacing Threshold Pulse Width: 1 ms
Lead Channel Pacing Threshold Pulse Width: 1 ms
Lead Channel Sensing Intrinsic Amplitude: 3.9 mV
Lead Channel Sensing Intrinsic Amplitude: 7.4 mV
Lead Channel Setting Pacing Amplitude: 1.125
Lead Channel Setting Pacing Amplitude: 2 V
Lead Channel Setting Pacing Pulse Width: 1 ms
Lead Channel Setting Sensing Sensitivity: 2 mV
Pulse Gen Model: 2272
Pulse Gen Serial Number: 9197143

## 2020-03-07 NOTE — Telephone Encounter (Signed)
I reviewed the patient's chart in CareEverywhere this morning.  His Cardiac MRI report from [facility] is now available.  I have printed this and pulled for Dr. Caryl Comes to review next week when he is back in the office.

## 2020-03-10 ENCOUNTER — Encounter: Admit: 2020-03-10 | Discharge: 2020-03-10 | Payer: MEDICARE

## 2020-03-10 NOTE — Progress Notes (Signed)
Remote pacemaker transmission.   

## 2020-03-17 ENCOUNTER — Encounter: Payer: Self-pay | Admitting: Internal Medicine

## 2020-03-17 ENCOUNTER — Telehealth: Payer: Self-pay | Admitting: Internal Medicine

## 2020-03-17 NOTE — Telephone Encounter (Signed)
Spoke with pt at length, explained  home remote monitoring process.  Pt also inquired about Cardiac MRI results.  Advised I do not see results posted on his chart yet, although they were done at [facility] so not likely to show up as a regular result.  Will send message to Dr. Olin Pia nurse [name] to f/u on results.

## 2020-03-17 NOTE — Telephone Encounter (Signed)
Patient is calling about a letter his received from the device clinic.

## 2020-03-17 NOTE — Telephone Encounter (Signed)
Attempted to callback, no answer/ left VM with device clinic number and hours to callback if he still has questions.

## 2020-03-17 NOTE — Telephone Encounter (Signed)
Follow up:     Patient returning a call back from this morning. Please call patient.

## 2020-03-18 NOTE — Telephone Encounter (Signed)
See 03/04/20 phone note regarding the patient's Cardiac MRI. Will close this encounter.

## 2020-03-18 NOTE — Telephone Encounter (Signed)
I spoke with Dr. Caryl Comes this morning to see if he was able to speak with the patient regarding his Cardiac MRI. Per Dr. Caryl Comes, he attempted to call the patient yesterday, but was unable to reach him. Dr. Caryl Comes advised he will try to reach out to the patient again.

## 2020-03-19 ENCOUNTER — Telehealth: Payer: Self-pay

## 2020-03-19 NOTE — Telephone Encounter (Signed)
Patient scheduled for 03/20/2020 - virtual with klein.

## 2020-03-19 NOTE — Telephone Encounter (Signed)
Patient returning call and is aware Dr. Caryl Comes will call back .    Patient will be unavailable today until 4 and is interested in scheduling a virtual visit to set a time for a conversation .   Please call.

## 2020-03-19 NOTE — Telephone Encounter (Signed)
Holding opening for 9/30 at 9 am.  Patient wants call back at 4

## 2020-03-19 NOTE — Telephone Encounter (Signed)
  Patient Consent for Virtual Visit         Devin Hale has provided verbal consent on 03/19/2020 for a virtual visit (video or telephone).   CONSENT FOR VIRTUAL VISIT FOR:  Devin Hale  By participating in this virtual visit I agree to the following:  I hereby voluntarily request, consent and authorize [facility] and its employed or contracted physicians, physician assistants, nurse practitioners or other licensed health care professionals (the Practitioner), to provide me with telemedicine health care services (the "Services") as deemed necessary by the treating Practitioner. I acknowledge and consent to receive the Services by the Practitioner via telemedicine. I understand that the telemedicine visit will involve communicating with the Practitioner through live audiovisual communication technology and the disclosure of certain medical information by electronic transmission. I acknowledge that I have been given the opportunity to request an in-person assessment or other available alternative prior to the telemedicine visit and am voluntarily participating in the telemedicine visit.  I understand that I have the right to withhold or withdraw my consent to the use of telemedicine in the course of my care at any time, without affecting my right to future care or treatment, and that the Practitioner or I may terminate the telemedicine visit at any time. I understand that I have the right to inspect all information obtained and/or recorded in the course of the telemedicine visit and may receive copies of available information for a reasonable fee.  I understand that some of the potential risks of receiving the Services via telemedicine include:  [name] Delay or interruption in medical evaluation due to technological equipment failure or disruption; . Information transmitted may not be sufficient (e.g. poor resolution of images) to allow for appropriate medical decision making by the Practitioner; and/or    . In rare instances, security protocols could fail, causing a breach of personal health information.  Furthermore, I acknowledge that it is my responsibility to provide information about my medical history, conditions and care that is complete and accurate to the best of my ability. I acknowledge that Practitioner's advice, recommendations, and/or decision may be based on factors not within their control, such as incomplete or inaccurate data provided by me or distortions of diagnostic images or specimens that may result from electronic transmissions. I understand that the practice of medicine is not an exact science and that Practitioner makes no warranties or guarantees regarding treatment outcomes. I acknowledge that a copy of this consent can be made available to me via my patient portal ([facility]), or I can request a printed copy by calling the office of [facility].    I understand that my insurance will be billed for this visit.   I have read or had this consent read to me. . I understand the contents of this consent, which adequately explains the benefits and risks of the Services being provided via telemedicine.  . I have been provided ample opportunity to ask questions regarding this consent and the Services and have had my questions answered to my satisfaction. . I give my informed consent for the services to be provided through the use of telemedicine in my medical care

## 2020-03-19 NOTE — Telephone Encounter (Signed)
OK to add on Dr. Olin Pia schedule for tomorrow morning at 8:40 am for a virtual visit if the patient is agreeable.

## 2020-03-20 ENCOUNTER — Telehealth: Payer: Self-pay | Admitting: Internal Medicine

## 2020-03-20 ENCOUNTER — Encounter: Payer: Self-pay | Admitting: Internal Medicine

## 2020-03-20 ENCOUNTER — Other Ambulatory Visit: Payer: Self-pay

## 2020-03-20 ENCOUNTER — Telehealth (INDEPENDENT_AMBULATORY_CARE_PROVIDER_SITE_OTHER): Payer: 59 | Admitting: Internal Medicine

## 2020-03-20 DIAGNOSIS — I442 Atrioventricular block, complete: Secondary | ICD-10-CM | POA: Diagnosis not present

## 2020-03-20 DIAGNOSIS — Z950 Presence of cardiac pacemaker: Secondary | ICD-10-CM | POA: Diagnosis not present

## 2020-03-20 DIAGNOSIS — C220 Liver cell carcinoma: Principal | ICD-10-CM

## 2020-03-20 NOTE — Telephone Encounter (Signed)
We would have to do: - Tuesday 10/26 at 8:20 am or - Tuesday 11/2 at 8:00 am

## 2020-03-20 NOTE — Progress Notes (Signed)
Electrophysiology TeleHealth Note   Due to national recommendations of social distancing due to COVID 19, an audio/video telehealth visit is felt to be most appropriate for this patient at this time.  See MyChart message from today for the patient's consent to telehealth for [facility].   Date:  03/20/2020   ID:  Devin Hale, DOB September 25, 1950, MRN [id number]  Location: patient's home  Provider location: [address] [place]  Evaluation Performed: Follow-up visit  PCP:  Theotis Burrow, MD  Cardiologist:     Electrophysiologist:  SK   Chief Complaint: Complete heart block  History of Present Illness:    Devin Hale is a 69 y.o. male who presents via audio/video conferencing for a telehealth visit today.  Since last being seen in our clinic for complete heart block s/p [employer] the patient reports  Stable cardiac symptoms, no chest pain no syncope   Hx of postprocedural atrial fibrillation  Interval evaluation at [facility] neurology for muscle weakness with biopsy pending and EMG "abnormal. 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    Date Cr K Hgb  4/21 0.89 4.5 14.5  9/21 1.12  14.9      The patient denies symptoms of fevers, chills, cough, or new SOB worrisome for COVID 19.    Past Medical History:  Diagnosis Date  . Hepatitis   . Hepatitis C     Past Surgical History:  Procedure Laterality Date  . KNEE ARTHROSCOPY Left 1982  . TEMPORARY PACEMAKER N/A 08/13/2019   Procedure: TEMPORARY PACEMAKER;  Surgeon: [name], MD;  Location: [facility];   Service: Cardiovascular;  Laterality: N/A;  . TONSILLECTOMY      Current Outpatient Medications  Medication Sig Dispense Refill  . acetaminophen (TYLENOL) 500 MG tablet Take 1 tablet (500 mg total) by mouth every 4 (four) hours as needed for moderate pain. 30 tablet 0  . alendronate (FOSAMAX) 70 MG tablet Take 70 mg by mouth once a week.    [name] ascorbic acid (VITAMIN C) 500 MG tablet Take 500 mg by mouth 2 (two) times daily.     [name] aspirin EC 81 MG EC tablet Take 1 tablet (81 mg total) by mouth daily.    [name] atorvastatin (LIPITOR) 40 MG tablet Take 1 tablet (40 mg total) by mouth daily. 90 tablet 0  . cholecalciferol (VITAMIN D3) 25 MCG (1000 UNIT) tablet Take 1,000 Units by mouth in the morning and at bedtime.     . Coenzyme Q10 (CO Q 10) 100 MG CAPS Take 100 mg by mouth daily.    . Docosahexaenoic Acid (DHA COMPLETE PO) Take 500 mg by mouth daily.    [name] ELIQUIS 5 MG TABS tablet Take 5 mg by mouth 2 (two) times daily.    [name] L-Lysine 500 MG TABS Take 250 mg by mouth as needed.     . magnesium oxide (MAG-OX) 400 MG tablet Take 200 mg by mouth 2 (two) times daily.    . Multiple Vitamin (MULTIVITAMIN WITH MINERALS) TABS tablet Take 1 tablet by mouth daily.    [name]  Omega-3 Fatty Acids (FISH OIL) 1000 MG CAPS Take by mouth daily.    . vitamin E (VITAMIN E) 200 UNIT capsule Take 200 Units by mouth daily.    . Whey Protein POWD Take by mouth daily.     No current facility-administered medications for this visit.    Allergies:   Patient has no known allergies.   Social History:  The patient  reports that he has never smoked. He has never used smokeless tobacco. He reports that he does not drink alcohol and does not use drugs.   Family History:  The patient's   family history includes Heart attack in his father; Non-Hodgkin's lymphoma in his mother.   ROS:  Please see the history of present illness.   All other systems are personally reviewed and negative.    Exam:    Vital Signs:  BP 102/70    Pulse 60   Ht 5' 10.5" (1.791 m)   Wt 170 lb (77.1 kg)   SpO2 97%   BMI 24.05 kg/m         Labs/Other Tests and Data Reviewed:    Recent Labs: 08/13/2019: ALT 18; B Natriuretic Peptide 707.0; TSH 2.163 10/02/2019: BUN 21; Creatinine, Ser 0.89; Hemoglobin 14.5; Platelets 165; Potassium 4.5; Sodium 136   Wt Readings from Last 3 Encounters:  03/20/20 170 lb (77.1 kg)  01/29/20 169 lb (76.7 kg)  10/02/19 157 lb (71.2 kg)     Other studies personally reviewed: Additional studies/ records that were reviewed today include: (As above)    ASSESSMENT & PLAN:    Complete heart block-intermittent  Pacemaker-[employer] with a [employer] His lead  Pericarditis post atrial lead microperforation/dislodgment  Atrial fibrillation postprocedure  Hepatitis C  Neuromuscular evaluation  Cor Art calcification   Cardiac stable discussed the abnormal CA calcification--may need CTA  Ongoing neuro and hepatology eval     COVID 19 screen The patient denies symptoms of COVID 19 at this time.  The importance of social distancing was discussed today.  Follow-up:  4w telehealth visit     Current medicines are reviewed at length with the patient today.   The patient does not have concerns regarding his medicines.  The following changes were made today:  none  Labs/ tests ordered today include:   No orders of the defined types were placed in this encounter.   Future tests ( post COVID )     Patient Risk:  after full review of this patients clinical status, I feel that they are at moderate risk at this time.  Today, I have spent minutes 15 with the patient with telehealth technology discussing the above.  Signed, Virl Axe, MD  03/20/2020 10:18 AM     [facility] [address] [place] [address] [phone] (office) [phone] (fax)

## 2020-03-20 NOTE — Telephone Encounter (Signed)
Patients AVS states he needs a 4 week virtual visit with Dr. Caryl Comes. Dr. Caryl Comes does not have anything at that time and is out of the office for vacation during November into December. Any advise on when to schedule this patient?

## 2020-03-20 NOTE — Patient Instructions (Signed)
Medication Instructions:  - Your physician recommends that you continue on your current medications as directed. Please refer to the Current Medication list given to you today.  *If you need a refill on your cardiac medications before your next appointment, please call your pharmacy*   Lab Work: - none ordered  If you have labs (blood work) drawn today and your tests are completely normal, you will receive your results only by: [name] MyChart Message (if you have MyChart) OR . A paper copy in the mail If you have any lab test that is abnormal or we need to change your treatment, we will call you to review the results.   Testing/Procedures: - none ordered   Follow-Up: At [facility], you and your health needs are our priority.  As part of our continuing mission to provide you with exceptional heart care, we have created designated Provider Care Teams.  These Care Teams include your primary Cardiologist (physician) and Advanced Practice Providers (APPs -  Physician Assistants and Nurse Practitioners) who all work together to provide you with the care you need, when you need it.  We recommend signing up for the patient portal called "MyChart".  Sign up information is provided on this After Visit Summary.  MyChart is used to connect with patients for Virtual Visits (Telemedicine).  Patients are able to view lab/test results, encounter notes, upcoming appointments, etc.  Non-urgent messages can be sent to your provider as well.   To learn more about what you can do with MyChart, go to [email or web address].    Your next appointment:   4 week(s)  The format for your next appointment:   Virtual Visit   Provider:   Virl Axe, MD   Other Instructions n/a

## 2020-03-20 NOTE — Telephone Encounter (Signed)
Virtual visit completed today with Dr. Caryl Comes.

## 2020-03-21 ENCOUNTER — Institutional Professional Consult (permissible substitution): Admit: 2020-03-21 | Discharge: 2020-03-22 | Payer: MEDICARE

## 2020-03-21 ENCOUNTER — Telehealth: Payer: Self-pay

## 2020-03-21 ENCOUNTER — Telehealth: Payer: Self-pay | Admitting: Internal Medicine

## 2020-03-21 DIAGNOSIS — C220 Liver cell carcinoma: Principal | ICD-10-CM

## 2020-03-21 NOTE — Telephone Encounter (Signed)
Patient called.  Stated that he has some updated for Dr. Caryl Comes that happened this morning and would like to make you aware of it.  He would not go into detail with me.

## 2020-03-21 NOTE — Telephone Encounter (Signed)
Ok to hold

## 2020-03-21 NOTE — Telephone Encounter (Signed)
New Message:     He wants to know if pt can hold his Eliquis 5 days for an Ablation on his Liver? He wanted this question to be directed to Dr Caryl Comes only.

## 2020-03-24 ENCOUNTER — Encounter: Payer: Self-pay | Admitting: Student in an Organized Health Care Education/Training Program

## 2020-03-24 ENCOUNTER — Other Ambulatory Visit: Payer: Self-pay

## 2020-03-24 ENCOUNTER — Ambulatory Visit
Payer: 59 | Attending: Student in an Organized Health Care Education/Training Program | Admitting: Student in an Organized Health Care Education/Training Program

## 2020-03-24 VITALS — BP 131/93 | HR 65 | Temp 97.4°F | Resp 16 | Ht 70.5 in | Wt 170.0 lb

## 2020-03-24 DIAGNOSIS — M25561 Pain in right knee: Secondary | ICD-10-CM

## 2020-03-24 DIAGNOSIS — G8929 Other chronic pain: Secondary | ICD-10-CM | POA: Diagnosis present

## 2020-03-24 DIAGNOSIS — M25562 Pain in left knee: Secondary | ICD-10-CM | POA: Insufficient documentation

## 2020-03-24 DIAGNOSIS — G894 Chronic pain syndrome: Secondary | ICD-10-CM | POA: Insufficient documentation

## 2020-03-24 DIAGNOSIS — M170 Bilateral primary osteoarthritis of knee: Secondary | ICD-10-CM

## 2020-03-24 NOTE — Telephone Encounter (Signed)
I was speaking with the patient about another issue. He advised he received a message last week about scheduling a follow up with Dr. Caryl Comes.  I have scheduled him for Tuesday 04/15/20 @ 8:20 am for a virtual visit.  He voices understanding and is agreeable.

## 2020-03-24 NOTE — Progress Notes (Signed)
Safety precautions to be maintained throughout the outpatient stay will include: orient to surroundings, keep bed in low position, maintain call bell within reach at all times, provide assistance with transfer out of bed and ambulation.  

## 2020-03-24 NOTE — Telephone Encounter (Signed)
Spoke with Dr Marcelene Butte and advised per Dr Caryl Comes OK to hold pt's Eliquis x 5 days for liver ablation.  Dr Melvia Heaps understanding and thanked RN for call.

## 2020-03-24 NOTE — Progress Notes (Signed)
Patient: Devin Hale  Service Category: E/M  Provider: Gillis Santa, MD  DOB: Aug 01, 1950  DOS: 03/24/2020  Referring Provider: Theotis Burrow*  MRN: [id number]  Setting: Ambulatory outpatient  PCP: Theotis Burrow, MD  Type: New Patient  Specialty: Interventional Pain Management    Location: Office  Delivery: Face-to-face     Primary Reason(s) for Visit: Encounter for initial evaluation of one or more chronic problems (new to examiner) potentially causing chronic pain, and posing a threat to normal musculoskeletal function. (Level of risk: High) CC: Knee Pain (bilateral) and Generalized Body Aches (systemic pain all over )  HPI  Devin Hale is a 69 y.o. year old, male patient, who comes for the first time to our practice referred by [place], Elyse Jarvis* for our initial evaluation of his chronic pain. 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 Today he comes in for evaluation of his Knee Pain (bilateral) and Generalized Body Aches (systemic pain all over )  Pain Assessment: Location: Left, Right Knee Radiating: denies Onset: More than a month ago Duration: Chronic pain Quality: Discomfort, Constant Severity: 10-Worst pain ever/10 (subjective, self-reported pain score)  Effect on ADL: difficult to walk, Timing: Constant Modifying factors: nothing currently BP: (!) 131/93  HR: 65  Onset and Duration: Date of onset: 2+ years Cause of pain: osteoarthritis Severity: Getting worse, NAS-11 at its worse: 10/10, NAS-11 at its best: 10/10, NAS-11 now: 10/10 and NAS-11 on the average: 10/10 Timing: During activity or exercise Aggravating Factors: Climbing, Prolonged standing, Squatting, Walking, Walking uphill and Walking downhill Alleviating Factors: Hot packs, Lying  down, Resting and Sitting Associated Problems: Night-time cramps and Fatigue Quality of Pain: Aching, Annoying, Disabling and Sharp Previous Examinations or Tests: Bone scan, EMG/PNCV, MRI scan, Spinal tap, X-rays, Nerve conduction test, Neurological evaluation, Neurosurgical evaluation and Orthopedic evaluation Previous Treatments: The patient denies has had knee gel injections.   Patient presents today with bilateral knee pain related to knee osteoarthritis.  Patient has a history of left knee arthroscopic surgery for meniscus repair.  He has significant tricompartmental osteoarthritis of bilateral knees.  He has pain towards the medial portion of his knee.  He has done physical therapy in the past as well as oral medications including NSAIDs, acetaminophen, Lyrica, opioid medication.  He is being referred from Dr. Candelaria Stagers to consider genicular nerve block/ablation.  Of note he has previously had intra-articular viscosupplementation which he found helpful.   Illegal Drugs: Negative  Rx Drugs: Negative  ORT Risk Level calculation: Low Risk  Opioid Risk Tool - 03/24/20 1209      Family History of Substance Abuse   Alcohol Negative    Illegal Drugs Negative    Rx Drugs Negative      Personal History of Substance Abuse   Alcohol Negative    Illegal Drugs Negative    Rx Drugs Negative      Age   Age between 105-45 years  No      Psychological Disease   Psychological Disease Negative    Depression Negative      Total Score   Opioid Risk Tool Scoring 0    Opioid Risk Interpretation Low Risk           Meds   Current Outpatient Medications:  .  acetaminophen (TYLENOL) 500 MG tablet, Take 1  tablet (500 mg total) by mouth every 4 (four) hours as needed for moderate pain., Disp: 30 tablet, Rfl: 0 .  alendronate (FOSAMAX) 70 MG tablet, Take 70 mg by mouth once a week., Disp: , Rfl:  .  ascorbic acid (VITAMIN C) 500 MG tablet, Take 500 mg by mouth 2 (two) times daily. , Disp: , Rfl:   .  aspirin EC 81 MG EC tablet, Take 1 tablet (81 mg total) by mouth daily., Disp:  , Rfl:  .  atorvastatin (LIPITOR) 40 MG tablet, Take 1 tablet (40 mg total) by mouth daily., Disp: 90 tablet, Rfl: 0 .  cholecalciferol (VITAMIN D3) 25 MCG (1000 UNIT) tablet, Take 1,000 Units by mouth in the morning and at bedtime. , Disp: , Rfl:  .  Coenzyme Q10 (CO Q 10) 100 MG CAPS, Take 100 mg by mouth daily., Disp: , Rfl:  .  Docosahexaenoic Acid (DHA COMPLETE PO), Take 500 mg by mouth daily., Disp: , Rfl:  .  ELIQUIS 5 MG TABS tablet, Take 5 mg by mouth 2 (two) times daily., Disp: , Rfl:  .  magnesium oxide (MAG-OX) 400 MG tablet, Take 200 mg by mouth 2 (two) times daily., Disp: , Rfl:  .  Multiple Vitamin (MULTIVITAMIN WITH MINERALS) TABS tablet, Take 1 tablet by mouth daily., Disp: , Rfl:  .  Omega-3 Fatty Acids (FISH OIL) 1000 MG CAPS, Take by mouth daily., Disp: , Rfl:  .  vitamin E (VITAMIN E) 200 UNIT capsule, Take 200 Units by mouth daily., Disp: , Rfl:  .  Whey Protein POWD, Take by mouth daily., Disp: , Rfl:  .  L-Lysine 500 MG TABS, Take 250 mg by mouth as needed.  (Patient not taking: Reported on 03/24/2020), Disp: , Rfl:   Imaging Review   X-[facility] brought from [facility] reveal significant medial narrowing of joint space, significant osteophytes, degenerative changes consistent with severe tricompartmental osteoarthritis. Complexity Note: Imaging results reviewed. Results shared with Devin Hale, using Layman's terms.                         ROS  Cardiovascular: Heart trouble, Abnormal heart rhythm, Daily Aspirin intake and Pacemaker or defibrillator Pulmonary or Respiratory: Sarcoidosis Neurological: No reported neurological signs or symptoms such as seizures, abnormal skin sensations, urinary and/or fecal incontinence, being born with an abnormal open spine and/or a tethered spinal cord Psychological-Psychiatric: Anxiousness Gastrointestinal: Inflamed liver (Hepatitis) Genitourinary: No  reported renal or genitourinary signs or symptoms such as difficulty voiding or producing urine, peeing blood, non-functioning kidney, kidney stones, difficulty emptying the bladder, difficulty controlling the flow of urine, or chronic kidney disease Hematological: No reported hematological signs or symptoms such as prolonged bleeding, low or poor functioning platelets, bruising or bleeding easily, hereditary bleeding problems, low energy levels due to low hemoglobin or being anemic Endocrine: No reported endocrine signs or symptoms such as high or low blood sugar, rapid heart rate due to high thyroid levels, obesity or weight gain due to slow thyroid or thyroid disease Rheumatologic: Joint aches and or swelling due to excess weight (Osteoarthritis), Generalized muscle aches (Fibromyalgia), Localized muscle inflammation and weakness (Myositis) and Generalized muscle inflammation and weakness (Polymyositis) Musculoskeletal: Negative for myasthenia gravis, muscular dystrophy, multiple sclerosis or malignant hyperthermia Work History: Disabled  Allergies  Devin Hale has No Known Allergies.  Laboratory Chemistry Profile   Renal Lab Results  Component Value Date   BUN 21 10/02/2019   CREATININE 0.89 10/02/2019   GFRAA >  60 10/02/2019   GFRNONAA >60 10/02/2019     Electrolytes Lab Results  Component Value Date   NA 136 10/02/2019   K 4.5 10/02/2019   CL 104 10/02/2019   CALCIUM 9.2 10/02/2019     Hepatic Lab Results  Component Value Date   AST 28 08/13/2019   ALT 18 08/13/2019   ALBUMIN 4.0 08/13/2019   ALKPHOS 67 08/13/2019     ID Lab Results  Component Value Date   HIV NON REACTIVE 08/13/2019   SARSCOV2NAA NEGATIVE 10/02/2019   MRSAPCR POSITIVE (A) 08/13/2019     Bone No results found for: VD25OH, XA128NO6VEH, MC9470JG2, EZ6629UT6, 25OHVITD1, 25OHVITD2, 25OHVITD3, TESTOFREE, TESTOSTERONE   Endocrine Lab Results  Component Value Date   GLUCOSE 136 (H) 10/02/2019   TSH 2.163  08/13/2019   FREET4 1.00 08/13/2019     Neuropathy Lab Results  Component Value Date   HIV NON REACTIVE 08/13/2019     CNS Lab Results  Component Value Date   COLORCSF PINK (A) 05/26/2018   APPEARCSF HAZY (A) 05/26/2018   RBCCOUNTCSF 4,023 (H) 05/26/2018   WBCCSF 21 (HH) 05/26/2018   POLYSCSF 55 05/26/2018   LYMPHSCSF 31 05/26/2018   EOSCSF 1 05/26/2018   PROTEINCSF 84 (H) 05/26/2018   GLUCCSF 63 05/26/2018   CSFOLI Comment 05/26/2018   CSFOLI REFERT 05/26/2018   IGGCSF 9.8 (H) 05/26/2018     Inflammation (CRP: Acute  ESR: Chronic) Lab Results  Component Value Date   CRP 4.0 (H) 10/02/2019   ESRSEDRATE 12 10/02/2019     Rheumatology No results found for: RF, ANA, LABURIC, URICUR, LYMEIGGIGMAB, LYMEABIGMQN, HLAB27   Coagulation Lab Results  Component Value Date   INR 1.1 08/13/2019   LABPROT 14.0 08/13/2019   APTT 34 05/26/2018   PLT 165 10/02/2019     Cardiovascular Lab Results  Component Value Date   BNP 707.0 (H) 08/13/2019   HGB 14.5 10/02/2019   HCT 43.3 10/02/2019     Screening Lab Results  Component Value Date   SARSCOV2NAA NEGATIVE 10/02/2019   MRSAPCR POSITIVE (A) 08/13/2019   HIV NON REACTIVE 08/13/2019     Cancer No results found for: CEA, CA125, LABCA2   Allergens No results found for: ALMOND, APPLE, ASPARAGUS, AVOCADO, BANANA, BARLEY, BASIL, BAYLEAF, GREENBEAN, LIMABEAN, WHITEBEAN, BEEFIGE, REDBEET, BLUEBERRY, BROCCOLI, CABBAGE, MELON, CARROT, CASEIN, CASHEWNUT, CAULIFLOWER, CELERY     Note: Lab results reviewed.  PFSH  Drug: Devin Hale  reports no history of drug use. Alcohol:  reports no history of alcohol use. Tobacco:  reports that he has never smoked. He has never used smokeless tobacco. Medical:  has a past medical history of Hepatitis and Hepatitis C. Family: family history includes Heart attack in his father; Non-Hodgkin's lymphoma in his mother.  Past Surgical History:  Procedure Laterality Date  . KNEE ARTHROSCOPY Left  1982  . TEMPORARY PACEMAKER N/A 08/13/2019   Procedure: TEMPORARY PACEMAKER;  Surgeon: [name], MD;  Location: [facility];  Service: Cardiovascular;  Laterality: N/A;  . TONSILLECTOMY     Active Ambulatory Problems    Diagnosis Date Noted  . Hepatitis C 07/28/2018  . Swelling of limb 07/28/2018  . Bilateral primary osteoarthritis of knee 11/23/2017  . Chronic venous insufficiency 08/13/2018  . AV block, 3rd degree ([place]) 08/13/2019  . 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 Chronic pain of both knees 03/24/2020  . Chronic pain syndrome 03/24/2020   Resolved Ambulatory Problems    Diagnosis Date Noted  .  No Resolved Ambulatory Problems   Past Medical History:  Diagnosis Date  . Hepatitis    Constitutional Exam  General appearance: Well nourished, well developed, and well hydrated. In no apparent acute distress Vitals:   03/24/20 1204  BP: (!) 131/93  Pulse: 65  Resp: 16  Temp: (!) 97.4 F (36.3 C)  TempSrc: Temporal  SpO2: 100%  Weight: 170 lb (77.1 kg)  Height: 5' 10.5" (1.791 m)   BMI Assessment: Estimated body mass index is 24.05 kg/m as calculated from the following:   Height as of this encounter: 5' 10.5" (1.791 m).   Weight as of this encounter: 170 lb (77.1 kg).  BMI interpretation table: BMI level Category Range association with higher incidence of chronic pain  <18 kg/m2 Underweight   18.5-24.9 kg/m2 Ideal body weight   25-29.9 kg/m2 Overweight Increased incidence by 20%  30-34.9 kg/m2 Obese (Class I) Increased incidence by 68%  35-39.9 kg/m2 Severe obesity (Class II) Increased incidence by 136%  >40 kg/m2 Extreme obesity (Class III) Increased incidence by 254%   Patient's current BMI Ideal Body weight  Body mass index is 24.05 kg/m. Ideal body weight: 74.1 kg (163 lb 7.5 oz) Adjusted ideal body weight: 75.3 kg (166 lb 1.3 oz)   BMI Readings from Last 4 Encounters:  03/24/20 24.05 kg/m  03/20/20 24.05 kg/m   01/29/20 23.91 kg/m  10/02/19 22.53 kg/m   Wt Readings from Last 4 Encounters:  03/24/20 170 lb (77.1 kg)  03/20/20 170 lb (77.1 kg)  01/29/20 169 lb (76.7 kg)  10/02/19 157 lb (71.2 kg)    Psych/Mental status: Alert, oriented x 3 (person, place, & time)       Eyes: PERLA Respiratory: No evidence of acute respiratory distress   Lumbar Exam  Skin & Axial Inspection: No masses, redness, or swelling Alignment: Symmetrical Functional ROM: Unrestricted ROM       Stability: No instability detected Muscle Tone/Strength: Functionally intact. No obvious neuro-muscular anomalies detected. Sensory (Neurological): Unimpaired   Gait & Posture Assessment  Ambulation: Unassisted Gait: Relatively normal for age and body habitus Posture: WNL   Lower Extremity Exam    Side: Right lower extremity  Side: Left lower extremity  Stability: No instability observed          Stability: No instability observed          Skin & Extremity Inspection: Skin color, temperature, and hair growth are WNL. No peripheral edema or cyanosis. No masses, redness, swelling, asymmetry, or associated skin lesions. No contractures.  Skin & Extremity Inspection: Skin color, temperature, and hair growth are WNL. No peripheral edema or cyanosis. No masses, redness, swelling, asymmetry, or associated skin lesions. No contractures.  Functional ROM: Pain restricted ROM for knee joint          Functional ROM: Pain restricted ROM for knee joint          Muscle Tone/Strength: Functionally intact. No obvious neuro-muscular anomalies detected.  Muscle Tone/Strength: Functionally intact. No obvious neuro-muscular anomalies detected.  Sensory (Neurological): Arthropathic arthralgia        Sensory (Neurological): Arthropathic arthralgia        DTR: Patellar: deferred today Achilles: deferred today Plantar: deferred today  DTR: Patellar: deferred today Achilles: deferred today Plantar: deferred today  Palpation: No palpable  anomalies  Palpation: No palpable anomalies   Assessment  Primary Diagnosis & Pertinent Problem List: The primary encounter diagnosis was Bilateral primary osteoarthritis of knee. Diagnoses of Chronic pain of both knees and Chronic pain  syndrome were also pertinent to this visit.  Visit Diagnosis (New problems to examiner): 1. Bilateral primary osteoarthritis of knee   2. Chronic pain of both knees   3. Chronic pain syndrome     Plan for repeat intra-articular viscosupplementation with Hyalgan.  Patient has had these in the past and has found them helpful, previously done with Dr. Thomasene Lot on September 24, 2019.[name]  Patient has an upcoming liver ablation procedure and would like to consider genicular nerve block and ablation after he has completed this.  This is reasonable.  Plan of Care (Initial workup plan)   Procedure Orders     KNEE INJECTION  Interventional management options: Devin Hale was informed that there is no guarantee that he would be a candidate for interventional therapies. The decision will be based on the results of diagnostic studies, as well as Devin Hale's risk profile.  Procedure(s) under consideration:  Bilateral intra-articular Hyalgan injection Genicular nerve block   Provider-requested follow-up: Return in about 1 week (around 03/31/2020) for B/L hyalgan injection.  Future Appointments  Date Time Provider [place]  03/31/2020 11:40 AM Gillis Santa, MD [facility] None  04/15/2020  8:20 AM Deboraha Sprang, MD [facility]  04/17/2020  1:30 PM Lucilla Lame, MD [facility] None  06/06/2020  7:00 AM [facility] DEVICE REMOTES [facility]  07/22/2020 11:20 AM Deboraha Sprang, MD [facility]  09/05/2020  7:00 AM [facility] DEVICE REMOTES [facility]  12/05/2020  7:00 AM [facility] DEVICE REMOTES [facility]  03/06/2021  7:00 AM [facility] DEVICE REMOTES [facility]  06/05/2021  7:00 AM [facility]  DEVICE REMOTES [facility]    Note by: Gillis Santa, MD Date: 03/24/2020; Time: 9:29 AM

## 2020-03-24 NOTE — Telephone Encounter (Signed)
I spoke with the patient. He advised that after his visit with Dr. Caryl Comes on 03/20/20, he received a call from the hepatology clinic at [facility] that they have found some spots on his liver and he will be needing to have a few different things done:  1) Muscle biopsy 2) Radiation therapy of the liver (he thinks this is gamma knife) 3) Liver ablation 4) and maybe some other type of nerve ablation  The patient states he was a little taken aback by this news and is wanting to get Dr. Olin Pia thoughts on holding eliquis.  I advised the patient, that I did see that another call came in on Friday in regards to the liver ablation from Dr. Binnie Kand, asking the patient to hold eliquis for 5 days prior to his ablation. Dr. Caryl Comes has ok'ed this and the patient is aware. He wanted to know when to restart this after his ablation.  I asked the patient how many physicians were to perform his different procedures and he did not know. I advised him that when any physician requests that he hold his blood thinner, then he will need to ask them to call our office directly to discuss this so this will take him out of the middle of this and decrease confusion as well. I have also advised him that we can only give clearance on the front end to hold a blood thinner and that our post procedure protocol will always be to resume this as soon as possible when the physician performing his procedure feels is safe for him to do so.   The patient voices understanding of the above information and was appreciative for the call back. I have advised him I will reach out to Dr. Tenny Craw office today as well.

## 2020-03-25 ENCOUNTER — Encounter: Payer: Self-pay | Admitting: Student in an Organized Health Care Education/Training Program

## 2020-03-26 ENCOUNTER — Telehealth: Payer: Self-pay

## 2020-03-26 NOTE — Telephone Encounter (Signed)
Update;  [facility] pharmacy authorization rep called me. She states that the hyalagn will probably not be authorized. She gave me a name of another medication that would not need prior auth. Jocelyn Lamer called the pharmacy and was told that we do not carry that certain medication. She is going to process the referral but the likelihood of it being authorized is low. I received another call from [facility] from the utilization end,  and the rep Einar Pheasant wanted to reaffirm that we would be using hyalgan. We are now waiting to hear from the [place] on authorization status.

## 2020-03-26 NOTE — Telephone Encounter (Signed)
Update: [facility] pharmacy called me and let me know that they denied authorization for the hyalgan. I called the patient to let him know, left a vm.

## 2020-03-26 NOTE — Telephone Encounter (Signed)
Dr. Holley Raring ordered hyalgan knee injections for his patient. I went on the [facility] website and it stated no pre cert reqd.as I use the CPT code 20610, which I have always used and never had any issues.  I scheduled the patient. Today,the patient called his insurance company and tried to start a prior authorization request, using the wrong medication, for the hyalgan knee injections he is scheduled for Monday. [employer] called me questioning this and I told them it is a hyalgan injection. The rep told me that hyalgan would require pre auth so I faxed in the information and called the patient, left a vm, cancelling his procedure until we could get prior auth. He called me back, was very belligerent, telling me that he has given [place] plenty of time to work on this and he expects it to be taken care of. He said he would be coming in on Monday and getting his injections so I could just put him right back on the schedule.He used profanity as well. I didn't get a chance to explain to him that we need to get prior auth first because he hung up on me after saying he would be here Monday to get his shots. I spoke with Dr. Holley Raring and he said if we don't get prior auth before Monday and he comes in anyway that he would talk to him about it.

## 2020-03-27 ENCOUNTER — Telehealth: Payer: Self-pay

## 2020-03-27 NOTE — Telephone Encounter (Signed)
The patient called back after he listened to my voicemail and states he wants to talk to Dr. Holley Raring about this issue. He states there are 4 different medicines that he could get approved and there is not reason why the pharmacy here cant order them.

## 2020-03-27 NOTE — Telephone Encounter (Signed)
The patient called back and left another vm asking why I hadn't called him back. I called him and told him that in his message he said he wanted to speak to Dr. Holley Raring about this and I sent the message to him. He said he wants Dr. Holley Raring to call him after 2 because he has another doctors appt and wont be available until then.

## 2020-03-27 NOTE — Telephone Encounter (Signed)
The patient called back and left a vm stating he wants to speak to Dr. Holley Raring about this. I forwarded to Dr. Holley Raring. Who told me that all he can offer the patient at this point is a genicular nerve block. In the meantime the patient called the pharmacy here saying that Dr. Holley Raring ordered synvisc and he wanted to know why they couldn't get it. Dr. Holley Raring never ordered synvisc, he ordered hyalgan. Jocelyn Lamer was updated on all that has been communicated so if the patient calls back I will direct him to her.

## 2020-03-28 ENCOUNTER — Telehealth: Payer: Self-pay | Admitting: Internal Medicine

## 2020-03-28 DIAGNOSIS — G729 Myopathy, unspecified: Secondary | ICD-10-CM | POA: Insufficient documentation

## 2020-03-28 NOTE — Telephone Encounter (Signed)
Returned call to pt. No answer. Left msg to call back.  

## 2020-03-28 NOTE — Telephone Encounter (Signed)
Pt c/o medication issue:  1. Name of Medication:Eliquis 5 mg po BID  and asa 81 mg po q d    2. How are you currently taking this medication (dosage and times per day)?   3. Are you having a reaction (difficulty breathing--STAT)? No   4. What is your medication issue? Patient having procedure 10-14 and wants to be sure he has the correct hold instructions for these meds.  Please call.

## 2020-03-31 ENCOUNTER — Encounter: Payer: Self-pay | Admitting: Student in an Organized Health Care Education/Training Program

## 2020-03-31 ENCOUNTER — Other Ambulatory Visit
Admission: RE | Admit: 2020-03-31 | Discharge: 2020-03-31 | Disposition: A | Discharge status: Home / Self Care | Payer: 59 | Source: Ambulatory Visit | Attending: Diagnostic Radiology | Admitting: Diagnostic Radiology

## 2020-03-31 ENCOUNTER — Other Ambulatory Visit: Payer: Self-pay

## 2020-03-31 ENCOUNTER — Ambulatory Visit: Payer: 59 | Admitting: Student in an Organized Health Care Education/Training Program

## 2020-03-31 ENCOUNTER — Telehealth: Payer: Self-pay

## 2020-03-31 ENCOUNTER — Ambulatory Visit (HOSPITAL_BASED_OUTPATIENT_CLINIC_OR_DEPARTMENT_OTHER): Payer: 59 | Admitting: Student in an Organized Health Care Education/Training Program

## 2020-03-31 DIAGNOSIS — G894 Chronic pain syndrome: Secondary | ICD-10-CM

## 2020-03-31 DIAGNOSIS — M25561 Pain in right knee: Secondary | ICD-10-CM

## 2020-03-31 DIAGNOSIS — G8929 Other chronic pain: Secondary | ICD-10-CM

## 2020-03-31 DIAGNOSIS — M25562 Pain in left knee: Secondary | ICD-10-CM

## 2020-03-31 DIAGNOSIS — Z01818 Encounter for other preprocedural examination: Secondary | ICD-10-CM | POA: Insufficient documentation

## 2020-03-31 DIAGNOSIS — M170 Bilateral primary osteoarthritis of knee: Secondary | ICD-10-CM

## 2020-03-31 DIAGNOSIS — Z20822 Contact with and (suspected) exposure to covid-19: Secondary | ICD-10-CM | POA: Insufficient documentation

## 2020-03-31 LAB — SARS CORONAVIRUS 2 (TAT 6-24 HRS): SARS Coronavirus 2: NEGATIVE

## 2020-03-31 NOTE — Progress Notes (Signed)
Patient came to the covid testing site at [facility], became upset when he was told that the test would be nasopharyngeal. Stated that he was told it would be a small nasal swab at the edge of the inside of the nose. After being instructed that this testing site is the nasopharyngeal test, he yelled that he was "sick of this shit and if you mess me up I'm going to f----ing sue you". He was told that he could go elsewhere then. The patient then drove off. 10 minutes later the patient drove back up to the covid testing site, much calmer and stated that he was told that he needed to get it done here. He did apologize for his behavior. The covid test was performed on his left nare, no complaints of pain, no bleeding. Stated that the test was ok.

## 2020-03-31 NOTE — Telephone Encounter (Signed)
Patient for VV today with Dr Holley Raring.

## 2020-03-31 NOTE — Telephone Encounter (Signed)
Pt advised and verbalized understanding.. he is already in process of holding his Eliquis for the full 5 days prior to his procedure... he will talk to Dr. Binnie Kand about when to restart after his liver ablation.

## 2020-03-31 NOTE — Telephone Encounter (Signed)
Left another message for the pt to call back.   (Per previous RN note re: the the pts question:  Spoke with Dr Marcelene Butte and advised per Dr Caryl Comes OK to hold pt's Eliquis x 5 days for liver ablation.  Dr Melvia Heaps understanding and thanked RN for call)

## 2020-03-31 NOTE — Progress Notes (Signed)
Devin Hale: Devin Hale  Service Category: E/M  Provider: Gillis Santa, MD  DOB: 18-Jun-1951  DOS: 03/31/2020  Location: Office  MRN: [id number]  Setting: Ambulatory outpatient  Referring Provider: Theotis Hale*  Type: Established Devin Hale  Specialty: Interventional Pain Management  PCP: Devin Burrow, MD  Location: Home  Delivery: TeleHealth     Virtual Encounter - Pain Management PROVIDER NOTE: Information contained herein reflects review and annotations entered in association with encounter. Interpretation of such information and data should be left to medically-trained personnel. Information provided to Devin Hale can be located elsewhere in the medical record under "Devin Hale Instructions". Document created using STT-dictation technology, any transcriptional errors that may result from process are unintentional.    Contact & Pharmacy Preferred: [phone] Home: [phone] (home) Mobile: [phone] (mobile) E-mail: No e-mail address on record  Devin Hale (N), Devin Hale - Devin Hale) [place] [address] Phone: [phone] Fax: [phone]   Pre-screening  Devin Hale offered "in-person" vs "virtual" encounter. Devin Hale indicated preferring virtual for this encounter.   Reason COVID-19*  Social distancing based on CDC and AMA recommendations.   I contacted Devin Hale on 03/31/2020 via telephone.      I clearly identified myself as Devin Santa, MD. I verified that I was speaking with the correct person using two identifiers (Name: Devin Hale, and date of birth: [birth year]).  Consent I sought verbal advanced consent from Devin Hale for virtual visit interactions. I informed Devin Hale of possible security and privacy concerns, risks, and limitations associated with providing "not-in-person" medical evaluation and management services. I also informed Devin Hale of the availability of "in-person" appointments. Finally,  I informed him that there would be a charge for the virtual visit and that Devin Hale could be  personally, fully or partially, financially responsible for it. Devin Hale expressed understanding and agreed to proceed.   Historic Elements   Devin Hale is a 69 y.o. year old, male Devin Hale evaluated today after our last contact on 03/24/2020. Devin Hale  has a past medical history of Hepatitis and Hepatitis C. Devin Hale also  has a past surgical history that includes Knee arthroscopy (Left, 1982); Tonsillectomy; and TEMPORARY PACEMAKER (N/A, 08/13/2019). Devin Hale has a current medication list which includes the following prescription(s): acetaminophen, alendronate, ascorbic acid, aspirin, atorvastatin, cholecalciferol, co q 10, docosahexaenoic acid, eliquis, l-lysine, magnesium oxide, multivitamin with minerals, fish oil, vitamin e, and whey protein. Devin Hale  reports that Devin Hale has never smoked. Devin Hale has never used smokeless tobacco. Devin Hale reports that Devin Hale does not drink alcohol and does not use drugs. Devin Hale has No Known Allergies.   HPI  Today, Devin Hale is being contacted for worsening of previously known (established) problem    Unfortunately, Devin Hale's intra-articular knee Hyalgan injection was not covered by insurance.  I have recommended that Devin Hale follow-up with Dr. Candelaria Stagers for his intra-articular viscosupplementation injections that Devin Hale has had with him before in the past.  At this time, Devin Hale would like to proceed with diagnostic genicular nerve block for his pain related to knee osteoarthritis.  We will place order as below.  Laboratory Chemistry Profile   Renal Lab Results  Component Value Date   BUN 21 10/02/2019   CREATININE 0.89 10/02/2019   GFRAA >60 10/02/2019   GFRNONAA >60 10/02/2019     Hepatic Lab Results  Component Value Date   AST 28 08/13/2019   ALT 18 08/13/2019   ALBUMIN 4.0 08/13/2019   ALKPHOS 67 08/13/2019  Electrolytes Lab Results  Component Value Date   NA 136 10/02/2019   K 4.5  10/02/2019   CL 104 10/02/2019   CALCIUM 9.2 10/02/2019     Bone No results found for: VD25OH, NL976BH4LPF, XT0240XB3, ZH2992EQ6, 25OHVITD1, 25OHVITD2, 25OHVITD3, TESTOFREE, TESTOSTERONE   Inflammation (CRP: Acute Phase) (ESR: Chronic Phase) Lab Results  Component Value Date   CRP 4.0 (H) 10/02/2019   ESRSEDRATE 12 10/02/2019       Note: Above Lab results reviewed.  Imaging  CUP PACEART REMOTE DEVICE CHECK Scheduled remote reviewed. Normal device function.   No new episodes or alerts. Devin Hale is 97% V paced (known complete heart block). Next remote 91 days.  Assessment  The primary encounter diagnosis was Bilateral primary osteoarthritis of knee. Diagnoses of Chronic pain of both knees and Chronic pain syndrome were also pertinent to this visit.  Plan of Care  [name] has a current medication list which includes the following long-term medication(s): atorvastatin and eliquis.  Orders:  Orders Placed This Encounter  Procedures  . GENICULAR NERVE BLOCK    Indication(s):  Sub-acute knee pain    Standing Status:   Future    Standing Expiration Date:   07/01/2020    Scheduling Instructions:     Side: Bilateral     Sedation: Devin Hale's choice.     Timeframe: As soon as schedule allows    Order Specific Question:   Where will this procedure be performed?    Answer:   [facility]   Follow-up plan:   Return in about 2 weeks (around 04/14/2020) for B/L GNB (ok to continue Eliquis).   Recent Visits Date Type Provider Dept  03/24/20 Office Visit Devin Santa, MD [facility]  Showing recent visits within past 90 days and meeting all other requirements Today's Visits Date Type Provider Dept  03/31/20 Telemedicine Devin Santa, MD [facility]  Showing today's visits and meeting all other requirements Future Appointments No visits were found meeting these conditions. Showing future appointments within next 90 days and meeting all other  requirements  I discussed the assessment and treatment plan with the Devin Hale. The Devin Hale was provided an opportunity to ask questions and all were answered. The Devin Hale agreed with the plan and demonstrated an understanding of the instructions.  Devin Hale advised to call back or seek an in-person evaluation if the symptoms or condition worsens.  Duration of encounter:20 minutes.  Note by: Devin Santa, MD Date: 03/31/2020; Time: 4:02 PM

## 2020-03-31 NOTE — Telephone Encounter (Signed)
He called and left a vm Friday saying he is still waiting on Dr. Holley Raring to call him. He said Dr. Holley Raring is the one who changed it to a hyalagan instead of genicular like he came here for. He said he wants to speak with Dr. Holley Raring today even if he has to have a virtual visit. He said he is getting impatient with [place] on how we are handling this.

## 2020-04-01 ENCOUNTER — Other Ambulatory Visit: Payer: 59

## 2020-04-01 ENCOUNTER — Telehealth: Payer: Self-pay | Admitting: *Deleted

## 2020-04-01 NOTE — Telephone Encounter (Signed)
Devin Hale receives his cardiology care through [facility] cardiology.  He will need to receive recommendations on his aspirin from their office.  Please forward request to requesting office.  Thank you.

## 2020-04-01 NOTE — Telephone Encounter (Signed)
   Primary Cardiologist: [facility]  Patient receives cardiology care from [facility].  He will need to receive cardiac recommendations and recommendations for aspirin from their practice.  Patient with diagnosis of atrial fibrillation on Eliquis for anticoagulation.    Procedure: left vastus lateralis muscle bx Date of procedure: TBD  CHADS2-VASc score of  1 (AGE)  CrCl 87.8 Platelet count 165  Per office protocol, patient can hold Eliquis for 2 days prior to procedure.    Patient will not need bridging with Lovenox (enoxaparin) around procedure.  I will route this recommendation to the requesting party via Epic fax function and remove from pre-op pool.  Please call with questions.  Jossie Ng. Bellagrace Sylvan NP-C    04/01/2020, 4:24 PM [place] [address] Office [phone] Fax [phone]

## 2020-04-01 NOTE — Telephone Encounter (Signed)
Patient with diagnosis of atrial fibrillation on Eliquis for anticoagulation.    Procedure: left vastus lateralis muscle bx Date of procedure: TBD  CHADS2-VASc score of  1 (AGE)  CrCl 87.8 Platelet count 165  Per office protocol, patient can hold Eliquis for 2 days prior to procedure.    Patient will not need bridging with Lovenox (enoxaparin) around procedure.

## 2020-04-01 NOTE — Telephone Encounter (Signed)
° °  [facility] Pre-operative Risk Assessment    HEARTCARE STAFF: - Please ensure there is not already an duplicate clearance open for this procedure. - Under Visit Info/Reason for Call, type in Other and utilize the format Clearance MM/DD/YY or Clearance TBD. Do not use dashes or single digits. - If request is for dental extraction, please clarify the # of teeth to be extracted.  Request for surgical clearance:  1. What type of surgery is being performed? LEFT VASTUS LATERALIS MUSCLE Bx   2. When is this surgery scheduled? TBD   3. What type of clearance is required (medical clearance vs. Pharmacy clearance to hold med vs. Both)? BOTH  4. Are there any medications that need to be held prior to surgery and how long? ELIQUIS AND ASA   5. Practice name and name of physician performing surgery? [facility]; DR. Dorothea Ogle PIDGEON   6. What is the office phone number? [phone]   7.   What is the office fax number? [phone]  8.   Anesthesia type (None, local, MAC, general) ? MAC AND LOCAL   Devin Hale 04/01/2020, 2:11 PM  _________________________________________________________________   (provider comments below)

## 2020-04-03 ENCOUNTER — Encounter: Admit: 2020-04-03 | Discharge: 2020-04-04 | Payer: MEDICARE

## 2020-04-03 ENCOUNTER — Encounter: Admit: 2020-04-03 | Discharge: 2020-04-04 | Payer: MEDICARE | Attending: Anesthesiology | Primary: Anesthesiology

## 2020-04-03 DIAGNOSIS — G8918 Other acute postprocedural pain: Secondary | ICD-10-CM | POA: Insufficient documentation

## 2020-04-03 DIAGNOSIS — C220 Liver cell carcinoma: Secondary | ICD-10-CM | POA: Insufficient documentation

## 2020-04-03 DIAGNOSIS — Z950 Presence of cardiac pacemaker: Secondary | ICD-10-CM | POA: Insufficient documentation

## 2020-04-04 MED ORDER — ONDANSETRON 4 MG DISINTEGRATING TABLET
ORAL_TABLET | Freq: Three times a day (TID) | ORAL | 0 refills | 10 days | Status: CP | PRN
Start: 2020-04-04 — End: 2020-04-14
  Filled 2020-04-04: qty 30, 10d supply, fill #0

## 2020-04-04 MED ORDER — ACETAMINOPHEN 325 MG TABLET
Freq: Four times a day (QID) | ORAL | 0.00000 days | PRN
Start: 2020-04-04 — End: ?

## 2020-04-04 MED ORDER — POLYETHYLENE GLYCOL 3350 17 GRAM ORAL POWDER PACKET
Freq: Two times a day (BID) | ORAL | 0 refills | 0 days | PRN
Start: 2020-04-04 — End: 2020-05-04

## 2020-04-04 MED ORDER — PREGABALIN 25 MG CAPSULE
Freq: Three times a day (TID) | ORAL | 0 refills | 0 days
Start: 2020-04-04 — End: ?

## 2020-04-04 MED ORDER — OXYCODONE 5 MG TABLET
ORAL_TABLET | Freq: Four times a day (QID) | ORAL | 0 refills | 5.00000 days | Status: CP | PRN
Start: 2020-04-04 — End: 2020-04-09
  Filled 2020-04-04: qty 20, 5d supply, fill #0

## 2020-04-04 MED ORDER — DOCUSATE SODIUM 100 MG CAPSULE
ORAL_CAPSULE | Freq: Two times a day (BID) | ORAL | 0 refills | 5 days
Start: 2020-04-04 — End: 2020-04-09

## 2020-04-04 MED ORDER — LEVOFLOXACIN 500 MG TABLET
ORAL_TABLET | Freq: Every day | ORAL | 0 refills | 6.00000 days | Status: CP
Start: 2020-04-04 — End: 2020-04-10
  Filled 2020-04-04: qty 6, 6d supply, fill #0

## 2020-04-04 MED FILL — ONDANSETRON 4 MG DISINTEGRATING TABLET: 10 days supply | Qty: 30 | Fill #0 | Status: AC

## 2020-04-04 MED FILL — OXYCODONE 5 MG TABLET: 5 days supply | Qty: 20 | Fill #0 | Status: AC

## 2020-04-04 MED FILL — LEVOFLOXACIN 500 MG TABLET: 6 days supply | Qty: 6 | Fill #0 | Status: AC

## 2020-04-07 ENCOUNTER — Emergency Department: Payer: 59

## 2020-04-07 ENCOUNTER — Emergency Department
Admission: EM | Admit: 2020-04-07 | Discharge: 2020-04-07 | Disposition: A | Discharge status: Home / Self Care | Payer: 59 | Attending: Emergency Medicine | Admitting: Emergency Medicine

## 2020-04-07 ENCOUNTER — Encounter: Payer: Self-pay | Admitting: Emergency Medicine

## 2020-04-07 ENCOUNTER — Other Ambulatory Visit: Payer: Self-pay

## 2020-04-07 DIAGNOSIS — Z7982 Long term (current) use of aspirin: Secondary | ICD-10-CM | POA: Diagnosis not present

## 2020-04-07 DIAGNOSIS — G8918 Other acute postprocedural pain: Secondary | ICD-10-CM | POA: Diagnosis not present

## 2020-04-07 DIAGNOSIS — Z96652 Presence of left artificial knee joint: Secondary | ICD-10-CM | POA: Diagnosis not present

## 2020-04-07 DIAGNOSIS — R109 Unspecified abdominal pain: Secondary | ICD-10-CM | POA: Diagnosis present

## 2020-04-07 LAB — COMPREHENSIVE METABOLIC PANEL
ALT: 136 U/L — ABNORMAL HIGH (ref 0–44)
AST: 86 U/L — ABNORMAL HIGH (ref 15–41)
Albumin: 4.1 g/dL (ref 3.5–5.0)
Alkaline Phosphatase: 62 U/L (ref 38–126)
Anion gap: 11 (ref 5–15)
BUN: 15 mg/dL (ref 8–23)
CO2: 24 mmol/L (ref 22–32)
Calcium: 9.4 mg/dL (ref 8.9–10.3)
Chloride: 97 mmol/L — ABNORMAL LOW (ref 98–111)
Creatinine, Ser: 0.99 mg/dL (ref 0.61–1.24)
GFR, Estimated: >60 mL/min (ref >60)
Glucose, Bld: 110 mg/dL — ABNORMAL HIGH (ref 70–99)
Potassium: 4 mmol/L (ref 3.5–5.1)
Sodium: 132 mmol/L — ABNORMAL LOW (ref 135–145)
Total Bilirubin: 2.1 mg/dL — ABNORMAL HIGH (ref 0.3–1.2)
Total Protein: 7.7 g/dL (ref 6.5–8.1)

## 2020-04-07 LAB — CBC
HCT: 41.7 % (ref 39.0–52.0)
Hemoglobin: 14.4 g/dL (ref 13.0–17.0)
MCH: 29.6 pg (ref 26.0–34.0)
MCHC: 34.5 g/dL (ref 30.0–36.0)
MCV: 85.6 fL (ref 80.0–100.0)
Platelets: 104 10*3/uL — ABNORMAL LOW (ref 150–400)
RBC: 4.87 MIL/uL (ref 4.22–5.81)
RDW: 12.5 % (ref 11.5–15.5)
WBC: 7 10*3/uL (ref 4.0–10.5)
nRBC: 0 % (ref 0.0–0.2)

## 2020-04-07 LAB — LIPASE, BLOOD: Lipase: 29 U/L (ref 11–51)

## 2020-04-07 MED ORDER — IOHEXOL 300 MG/ML  SOLN
100.0000 mL | Freq: Once | INTRAMUSCULAR | Status: AC | PRN
  Administered 2020-04-07: 100 mL via INTRAVENOUS

## 2020-04-07 MED ORDER — OXYCODONE HCL 5 MG PO TABS: 5.0000 mg | ORAL_TABLET | Freq: Four times a day (QID) | ORAL | 0 refills | Status: AC | PRN

## 2020-04-07 MED ORDER — MORPHINE SULFATE (PF) 4 MG/ML IV SOLN
8.0000 mg | Freq: Once | INTRAVENOUS | Status: AC
  Administered 2020-04-07: 8 mg via INTRAVENOUS
  Filled 2020-04-07: qty 2

## 2020-04-07 NOTE — Discharge Instructions (Signed)
Please follow-up with your pain physician.  Return to the ED with any significantly worsening pain, fevers or vomiting.

## 2020-04-07 NOTE — ED Provider Notes (Signed)
[facility] Emergency Department Provider Note ____________________________________________   First MD Initiated Contact with Patient 04/07/20 1737     (approximate)  I have reviewed the triage vital signs and the nursing notes.  HISTORY  Chief Complaint No chief complaint on file.   HPI Devin Hale is a 69 y.o. malewho presents to the ED for evaluation of postoperative abdominal pain.  Chart review indicates history of hepatitis C and HCC. Telephone encounter this morning where he called his GI specialist due to acute pain unrelieved by oxycodone, advised to go to the ED. Chronic pain disorder followed by [facility] pain clinic.  Per chart review on care everywhere, noted to have "successful ablation of segment 5 lesion and 3 fiducials placed at the dome lesion."  This was performed on 10/14.  Required postop hospitalist observation for pain control and nausea.  Discharged with 7-day course of Levaquin for prophylaxis and 5 days of oxycodone (20 tablets of 5 mg).  Patient reports acutely worsening epigastric and RUQ pain over the weekend in the past 2 days.  He reports minimal relief with up to 10 mg of oxycodone per dose and reports concern for postoperative complication.  Reports 10/10 intensity RUQ pain has been constant, decreasing to 6/10 intensity pain after 10 mg of oxycodone.  Reports poor p.o. intake due to pain and nausea, denies vomiting, fever, diarrhea or dysuria.  Patient reports he has not seen his pain management physician in about 1 year.  He is no longer following this clinic.  Review of PDMP indicates he has not filled any opiates recently beyond his discharged 20 tablets.  Past Medical History:  Diagnosis Date  . Hepatitis   . Hepatitis C     Patient Active Problem List   Diagnosis Date Noted  . Chronic pain of both knees 03/24/2020  . Chronic pain syndrome 03/24/2020  . AV block, 3rd degree ([place]) 08/13/2019  . Complete heart block by  electrocardiogram ([place]) 08/13/2019  . Chronic venous insufficiency 08/13/2018  . Hepatitis C 07/28/2018  . Swelling of limb 07/28/2018  . Bilateral primary osteoarthritis of knee 11/23/2017    Past Surgical History:  Procedure Laterality Date  . KNEE ARTHROSCOPY Left 1982  . LIVER BIOPSY    . TEMPORARY PACEMAKER N/A 08/13/2019   Procedure: TEMPORARY PACEMAKER;  Surgeon: [name], MD;  Location: [facility];  Service: Cardiovascular;  Laterality: N/A;  . TONSILLECTOMY      Prior to Admission medications   Medication Sig Start Date End Date Taking? Authorizing Provider  acetaminophen (TYLENOL) 500 MG tablet Take 1 tablet (500 mg total) by mouth every 4 (four) hours as needed for moderate pain. 10/02/19 10/01/20  Duffy Bruce, MD  alendronate (FOSAMAX) 70 MG tablet Take 70 mg by mouth once a week. 01/24/19   [provider]  ascorbic acid (VITAMIN C) 500 MG tablet Take 500 mg by mouth 2 (two) times daily.     [provider]  aspirin EC 81 MG EC tablet Take 1 tablet (81 mg total) by mouth daily. 08/15/19   Ezekiel Slocumb, DO  atorvastatin (LIPITOR) 40 MG tablet Take 1 tablet (40 mg total) by mouth daily. 02/01/20   Deboraha Sprang, MD  cholecalciferol (VITAMIN D3) 25 MCG (1000 UNIT) tablet Take 1,000 Units by mouth in the morning and at bedtime.     [provider]  Coenzyme Q10 (CO Q 10) 100 MG CAPS Take 100 mg by mouth daily.  [provider]  Docosahexaenoic Acid (DHA COMPLETE PO) Take 500 mg by mouth daily.    [provider]  ELIQUIS 5 MG TABS tablet Take 5 mg by mouth 2 (two) times daily. 09/27/19   [provider]  L-Lysine 500 MG TABS Take 250 mg by mouth as needed.  Patient not taking: Reported on 03/24/2020    [provider]  magnesium oxide (MAG-OX) 400 MG tablet Take 200 mg by mouth 2 (two) times daily.    [provider]  Multiple Vitamin (MULTIVITAMIN WITH MINERALS) TABS tablet Take 1  tablet by mouth daily.    [provider]  Omega-3 Fatty Acids (FISH OIL) 1000 MG CAPS Take by mouth daily.    [provider]  oxyCODONE (ROXICODONE) 5 MG immediate release tablet Take 1 tablet (5 mg total) by mouth every 6 (six) hours as needed for up to 3 days for moderate pain or severe pain. 04/07/20 04/10/20  Vladimir Crofts, MD  vitamin E (VITAMIN E) 200 UNIT capsule Take 200 Units by mouth daily.    [provider]  Whey Protein POWD Take by mouth daily.    [provider]    Allergies Patient has no known allergies.  Family History  Problem Relation Age of Onset  . Non-Hodgkin's lymphoma Mother   . Heart attack Father     Social History Social History   Tobacco Use  . Smoking status: Never Smoker  . Smokeless tobacco: Never Used  Vaping Use  . Vaping Use: Never used  Substance Use Topics  . Alcohol use: No  . Drug use: Never    Review of Systems  Constitutional: No fever/chills Eyes: No visual changes. ENT: No sore throat. Cardiovascular: Denies chest pain. Respiratory: Denies shortness of breath. Gastrointestinal:No nausea, no vomiting.  No diarrhea.  No constipation.  Positive for abdominal pain and poor p.o. intake. Genitourinary: Negative for dysuria. Musculoskeletal: Negative for back pain. Skin: Negative for rash. Neurological: Negative for headaches, focal weakness or numbness.  ____________________________________________   PHYSICAL EXAM:  VITAL SIGNS: Vitals:   04/07/20 1746 04/07/20 2031  BP: 129/84 121/70  Pulse: 87 82  Resp: 17 15  Temp:    SpO2: 97% 94%     Constitutional: Alert and oriented. Well appearing and in no acute distress. Eyes: Conjunctivae are normal. PERRL. EOMI. Head: Atraumatic. Nose: No congestion/rhinnorhea. Mouth/Throat: Mucous membranes are moist.  Oropharynx non-erythematous. Neck: No stridor. No cervical spine tenderness to palpation. Cardiovascular: Normal rate, regular rhythm.  Grossly normal heart sounds.  Good peripheral circulation. Respiratory: Normal respiratory effort.  No retractions. Lungs CTAB. Gastrointestinal: Soft , nondistended. No abdominal bruits. No CVA tenderness. Soft with diffuse and mild tenderness without focal features.  No peritoneal features. Musculoskeletal: No lower extremity tenderness nor edema.  No joint effusions. No signs of acute trauma. Neurologic:  Normal speech and language. No gross focal neurologic deficits are appreciated. No gait instability noted. Skin:  Skin is warm, dry and intact. No rash noted. Psychiatric: Mood and affect are normal. Speech and behavior are normal.  ____________________________________________   LABS (all labs ordered are listed, but only abnormal results are displayed)  Labs Reviewed  COMPREHENSIVE METABOLIC PANEL - Abnormal; Notable for the following components:      Result Value   Sodium 132 (*)    Chloride 97 (*)    Glucose, Bld 110 (*)    AST 86 (*)    ALT 136 (*)    Total Bilirubin 2.1 (*)  All other components within normal limits  CBC - Abnormal; Notable for the following components:   Platelets 104 (*)    All other components within normal limits  LIPASE, BLOOD  URINALYSIS, COMPLETE (UACMP) WITH MICROSCOPIC    ____________________________________________  RADIOLOGY  ED MD interpretation:    Official radiology report(s): CT ABDOMEN PELVIS W CONTRAST  Result Date: 04/07/2020 CLINICAL DATA:  69 year old male with abdominal pain. Status post recent liver ablation. EXAM: CT ABDOMEN AND PELVIS WITH CONTRAST TECHNIQUE: Multidetector CT imaging of the abdomen and pelvis was performed using the standard protocol following bolus administration of intravenous contrast. CONTRAST:  175mL OMNIPAQUE IOHEXOL 300 MG/ML  SOLN COMPARISON:  Chest CT dated 10/02/2019. FINDINGS: Lower chest: Trace right pleural effusion. There are bibasilar subsegmental consolidation, likely atelectasis. Pneumonia  is not excluded clinical correlation is recommended. There is mild cardiomegaly. Cardiac pacemaker wires noted. No intra-abdominal free air or free fluid. Hepatobiliary: There is a 5.2 x 2.8 cm ovoid hypodense area with faint slightly higher attenuation centrally in the anterior right lobe of the liver in keeping with ablation zone. No gas. Several small metallic densities in the posterior right lobe of the liver new since the prior CT and likely related to ablation marker. A small linear hypodense area in the posterior right lobe of the liver (12/2) associated with one of the markers. Morphologic changes of cirrhosis. No intrahepatic biliary ductal dilatation. The gallbladder is unremarkable. Pancreas: There is mild diffuse dilatation of the main pancreatic duct. The pancreas is otherwise unremarkable. Spleen: Mild splenomegaly measuring approximately 15 cm in length. Adrenals/Urinary Tract: The adrenal glands unremarkable. There is no hydronephrosis on either side. There is symmetric enhancement and excretion of contrast by both kidneys. The visualized ureters and urinary bladder appear unremarkable. Stomach/Bowel: There is no bowel obstruction or active inflammation. The appendix is normal. Vascular/Lymphatic: The abdominal aorta and IVC are unremarkable. No portal venous gas. Varices noted in the left upper abdomen. There is no adenopathy. Reproductive: The prostate gland is mildly enlarged measuring 5.2 cm in transverse axial diameter. The seminal vesicles are symmetric. Other: Small fat containing umbilical hernia. Musculoskeletal: Osteopenia with degenerative changes of the spine. Age indeterminate, likely chronic compression fracture of the superior endplate of L3 with approximately 40% loss of vertebral body height. There is approximately 5 mm retropulsion of the posterosuperior L3 endplate. Correlation with point tenderness recommended. IMPRESSION: 1. Status post recent liver ablations with evolution of a  ablation zone in the right lobe of the liver. 2. Cirrhosis with evidence of portal hypertension, splenomegaly, and varices. 3. Trace right pleural effusion with bibasilar subsegmental consolidation, likely atelectasis. Pneumonia is not excluded clinical correlation is recommended. 4. No bowel obstruction. Normal appendix. 5. Mildly enlarged prostate gland. Electronically Signed   By: Anner Crete M.D.   On: 04/07/2020 20:20    ____________________________________________   PROCEDURES and INTERVENTIONS  Procedure(s) performed (including Critical Care):  Procedures  Medications  morphine 4 MG/ML injection 8 mg (8 mg Intravenous Given 04/07/20 1930)  iohexol (OMNIPAQUE) 300 MG/ML solution 100 mL (100 mLs Intravenous Contrast Given 04/07/20 1950)    ____________________________________________   MDM / ED COURSE  69 year old man who is 4 days postop from liver ablation for [facility] presents with acute pain without evidence of acute pathology, amenable to outpatient management.  Normal vital signs room air.  Exam without peritoneal abdomen and a well-appearing patient.  Due to his acutely worsening pain, CT obtained to rule out postoperative complications.  He has no evidence of  such.  Blood work corroborates without evidence of postoperative abscess or bleed.  Patient is no longer following with pain management and he has reason for acute pain postoperatively, so I think it is reasonable to discharge him with a couple more tablets of oxycodone.  We discussed following up with his PCP and reaching back out to his pain management clinic.  We discussed return precautions for the ED.  Patient medically stable for discharge home.      ____________________________________________   FINAL CLINICAL IMPRESSION(S) / ED DIAGNOSES  Final diagnoses:  Acute post-operative pain     ED Discharge Orders         Ordered    oxyCODONE (ROXICODONE) 5 MG immediate release tablet  Every 6 hours PRN         04/07/20 2100           Verenise Moulin   Note:  This document was prepared using Dragon voice recognition software and may include unintentional dictation errors.   Vladimir Crofts, MD 04/07/20 2109

## 2020-04-07 NOTE — ED Triage Notes (Signed)
Had ablation to liver last week at [facility].  Discharged Friday.  ARrives today c/o pain.  Has been taking Oxycodone, having pain despite medication. Spoke with Dr. Orland Mustard at [facility], who referred patient to ED for evaluation.

## 2020-04-08 ENCOUNTER — Telehealth: Payer: Self-pay | Admitting: Internal Medicine

## 2020-04-08 DIAGNOSIS — C220 Liver cell carcinoma: Principal | ICD-10-CM

## 2020-04-08 MED ORDER — OXYCODONE 5 MG TABLET
ORAL_TABLET | ORAL | 0 refills | 2 days | Status: CN | PRN
Start: 2020-04-08 — End: ?

## 2020-04-08 NOTE — Telephone Encounter (Signed)
I spoke with the patient. He was calling to inquire about his eliquis and aspirin post procedure.  He is s/p GI surgery at [facility] last week. I advised him that any medications that were stopped preoperatively should be resumed postoperatively at the discretion of the performing physician.   The patient states he restarted eliquis on Sunday of his own accord.  He reviewed some of his records from the hospital while on the phone with me and saw that he was given eliquis post procedure in the hospital at [facility].  I advised then he should be ok to be taking this outpatient. He was also asking me about his ASA. I advised I was unsure why he was on both ASA and eliquis.  He was concerned about data that came out over the weekend about ASA not being recommended for long term use.   I advised him I do not know Dr. Olin Pia thoughts on this as he has been out of the office. He is due for a virtual visit with Dr. Caryl Comes on 04/15/20. I have advised that he should be ok to hold the ASA until that appointment if he is back on the Eliquis.  He is aware he may discuss further with Dr. Caryl Comes at that time.  The patient voices understanding and is agreeable.

## 2020-04-08 NOTE — Telephone Encounter (Signed)
Patient calling  Requesting to speak with [facility] Patient has completed procedure and will be going back on heart medications soon and wants to clarify information Please call to discuss

## 2020-04-10 ENCOUNTER — Telehealth: Payer: Self-pay | Admitting: *Deleted

## 2020-04-10 ENCOUNTER — Telehealth: Payer: Self-pay | Admitting: Student in an Organized Health Care Education/Training Program

## 2020-04-10 NOTE — Telephone Encounter (Signed)
Dr. Marcelene Butte [phone] would like to speak with you about patient Devin Hale 12/29/1950.  Please call him at your convenience.

## 2020-04-11 ENCOUNTER — Other Ambulatory Visit: Payer: Self-pay | Admitting: Family Medicine

## 2020-04-11 ENCOUNTER — Ambulatory Visit: Payer: 59

## 2020-04-11 DIAGNOSIS — R071 Chest pain on breathing: Secondary | ICD-10-CM

## 2020-04-11 DIAGNOSIS — R0689 Other abnormalities of breathing: Secondary | ICD-10-CM

## 2020-04-14 ENCOUNTER — Ambulatory Visit: Payer: 59

## 2020-04-15 ENCOUNTER — Other Ambulatory Visit: Payer: Self-pay

## 2020-04-15 ENCOUNTER — Telehealth (INDEPENDENT_AMBULATORY_CARE_PROVIDER_SITE_OTHER): Payer: 59 | Admitting: Internal Medicine

## 2020-04-15 ENCOUNTER — Encounter: Payer: Self-pay | Admitting: Internal Medicine

## 2020-04-15 VITALS — HR 66 | Ht 70.0 in | Wt 164.0 lb

## 2020-04-15 DIAGNOSIS — Z950 Presence of cardiac pacemaker: Secondary | ICD-10-CM

## 2020-04-15 DIAGNOSIS — I442 Atrioventricular block, complete: Secondary | ICD-10-CM | POA: Diagnosis not present

## 2020-04-15 NOTE — Patient Instructions (Signed)
Medication Instructions:  - Your physician recommends that you continue on your current medications as directed. Please refer to the Current Medication list given to you today.  *If you need a refill on your cardiac medications before your next appointment, please call your pharmacy*   Lab Work: - none ordered  If you have labs (blood work) drawn today and your tests are completely normal, you will receive your results only by: [name] MyChart Message (if you have MyChart) OR . A paper copy in the mail If you have any lab test that is abnormal or we need to change your treatment, we will call you to review the results.   Testing/Procedures: - none ordered   Follow-Up: At [facility], you and your health needs are our priority.  As part of our continuing mission to provide you with exceptional heart care, we have created designated Provider Care Teams.  These Care Teams include your primary Cardiologist (physician) and Advanced Practice Providers (APPs -  Physician Assistants and Nurse Practitioners) who all work together to provide you with the care you need, when you need it.  We recommend signing up for the patient portal called "MyChart".  Sign up information is provided on this After Visit Summary.  MyChart is used to connect with patients for Virtual Visits (Telemedicine).  Patients are able to view lab/test results, encounter notes, upcoming appointments, etc.  Non-urgent messages can be sent to your provider as well.   To learn more about what you can do with MyChart, go to [email or web address].    Your next appointment:   3 month(s)  The format for your next appointment:   In Person  Provider:   Virl Axe, MD   Other Instructions n/a

## 2020-04-15 NOTE — Progress Notes (Signed)
Electrophysiology TeleHealth Note   Due to national recommendations of social distancing due to COVID 19, an audio/video telehealth visit is felt to be most appropriate for this patient at this time.  See MyChart message from today for the patient's consent to telehealth for [facility].   Date:  04/15/2020   ID:  Devin Hale, DOB 05/18/1951, MRN [id number]  Location: patient's home  Provider location: [address], [place]  Evaluation Performed: Follow-up visit  PCP:  Marguerita Merles, MD  Cardiologist:    Electrophysiologist:  SK   Chief Complaint:   Heart block   History of Present Illness:    Devin Hale is a 69 y.o. male who presents via audio/video conferencing for a telehealth visit today.  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   Presented February 2021 with high-grade heart block.  Temporary pacemaker was placed and the patient was transferred to [facility]>> underwent His bundle pacing [facility]-lead) pacemaker implantation St Jude complicated by atrial lead dislodgment as well as pericarditis for which he received colchicine for a month.  Also developed postprocedural atrial fibrillation and started on Eliquis  unipolar pacing>>  pocket stimulation during backup pulsing.  Programmed to bipolar pacing   Abnormal cMRI (See Below) discussion with [facility], not engaged with idea of PET.  DATE TEST EF   2/21 LHC    % Nonobstructive Cx/RCA  4/21 Echo  55-65% Small mod pericardial effusion  9/21 cMRI  63% +LGE-- 4 regions> RV basal septum-adj to His lead; RV insertion Basal-ant/apical inferior wall        Date Cr K Hgb ALT  4/21 0.89 4.5 14.5   10/21 0.99 4.0 14.4 136    Past medical history notable for chronic hepatitis C followed at [facility]. >>  Liver lesion ablation 2/2 increased size of lesions; cx by  dyspnea.   No pleurisy Cyber knife procedureanticipated Crrhs and portal hypertension  Breathing -- wheezing, dyspnea with talking--no assoc with edema of abd swelling, but now improved.    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     Past Medical History:  Diagnosis Date  . Complete heart block ([place])   . Hepatitis   . Hepatitis C   . Pacemaker     Past Surgical History:  Procedure Laterality Date  . KNEE ARTHROSCOPY Left 1982  . LIVER BIOPSY    . TEMPORARY PACEMAKER N/A 08/13/2019   Procedure: TEMPORARY PACEMAKER;  Surgeon: [name], MD;  Location: [place] [facility];  Service: Cardiovascular;  Laterality: N/A;  . TONSILLECTOMY      Current Outpatient Medications  Medication Sig Dispense Refill  . acetaminophen (TYLENOL) 500 MG tablet Take 1 tablet (500 mg total) by mouth every 4 (four) hours as needed for moderate pain. 30 tablet 0  . alendronate (FOSAMAX) 70 MG tablet Take 70 mg by mouth once a week.    [name] ascorbic acid (VITAMIN C) 500 MG tablet Take 500 mg by mouth 2 (two) times daily.     [name] atorvastatin (LIPITOR) 40 MG tablet Take 1 tablet (40 mg total) by mouth daily. 90 tablet 0  . cholecalciferol (VITAMIN D3) 25  MCG (1000 UNIT) tablet Take 1,000 Units by mouth in the morning and at bedtime.     . Coenzyme Q10 (CO Q 10) 100 MG CAPS Take 100 mg by mouth daily.    . Docosahexaenoic Acid (DHA COMPLETE PO) Take 500 mg by mouth daily.    [name] ELIQUIS 5 MG TABS tablet Take 5 mg by mouth 2 (two) times daily.    . magnesium oxide (MAG-OX) 400 MG tablet Take 200 mg by mouth 2 (two) times daily.    . Multiple Vitamin (MULTIVITAMIN WITH MINERALS) TABS tablet Take 1 tablet by mouth daily.    . Omega-3 Fatty Acids (FISH OIL) 1000 MG CAPS Take by mouth daily.    . vitamin E  (VITAMIN E) 200 UNIT capsule Take 200 Units by mouth daily.    . Whey Protein POWD Take by mouth daily.    [name] aspirin EC 81 MG EC tablet Take 1 tablet (81 mg total) by mouth daily. (Patient not taking: Reported on 04/15/2020)     No current facility-administered medications for this visit.    Allergies:   Patient has no known allergies.   Social History:  The patient  reports that he has never smoked. He has never used smokeless tobacco. He reports that he does not drink alcohol and does not use drugs.   Family History:  The patient's   family history includes Heart attack in his father; Non-Hodgkin's lymphoma in his mother.   ROS:  Please see the history of present illness.   All other systems are personally reviewed and negative.    Exam:    Vital Signs:  Pulse 66   Ht 5\' 10"  (1.778 m)   Wt 164 lb (74.4 kg)   SpO2 97%   BMI 23.53 kg/m     Labs/Other Tests and Data Reviewed:    Recent Labs: 08/13/2019: B Natriuretic Peptide 707.0; TSH 2.163 04/07/2020: ALT 136; BUN 15; Creatinine, Ser 0.99; Hemoglobin 14.4; Platelets 104; Potassium 4.0; Sodium 132   Wt Readings from Last 3 Encounters:  04/15/20 164 lb (74.4 kg)  04/07/20 164 lb 14.5 oz (74.8 kg)  03/24/20 170 lb (77.1 kg)     Other studies personally reviewed: Additional studies/ records that were reviewed today include:(As above)    ASSESSMENT & PLAN:   Complete heart block-intermittent  Abnormal cMRI with LGE  Pacemaker-Saint Jude with a [employer] His lead  Pericarditis post atrial lead microperforation/dislodgment  Atrial fibrillation postprocedure  Hepatitis C  Hepatic lesions s/p ablation  Neuromuscular issue currently undiagnosed  Hypertension    *  No intercurrent atrial fibrillation or flutter  On Anticoagulation;  No bleeding issues -- no longer taking ASA   Discussed re possible referreal to Dr Ruby Cola re eval of abnormal cMRI  Dyspnea much improved -- not sure why   Will  followup BP which is elevated     COVID 19 screen The patient denies symptoms of COVID 19 at this time.  The importance of social distancing was discussed today.  Follow-up:  34m     Current medicines are reviewed at length with the patient today.   The patient  concerns regarding his medicines.  The following changes were made today:    Labs/ tests ordered today include:  No orders of the defined types were placed in this encounter.   Future tests ( post COVID )   in   months  Patient Risk:  after full review of this patients clinical status, I  feel that they are at moderate risk at this time.  Today, I have spent 22  minutes with the patient with telehealth technology discussing the above.  Signed, Virl Axe, MD  04/15/2020 9:31 AM     [address] [place] [address] [phone] (office) [phone] (fax)      Virl Axe

## 2020-04-16 ENCOUNTER — Ambulatory Visit
Payer: 59 | Attending: Student in an Organized Health Care Education/Training Program | Admitting: Student in an Organized Health Care Education/Training Program

## 2020-04-17 ENCOUNTER — Other Ambulatory Visit: Payer: Self-pay

## 2020-04-17 ENCOUNTER — Ambulatory Visit: Payer: 59 | Admitting: Gastroenterology

## 2020-04-17 ENCOUNTER — Telehealth: Payer: Self-pay

## 2020-04-17 DIAGNOSIS — I8500 Esophageal varices without bleeding: Secondary | ICD-10-CM

## 2020-04-17 DIAGNOSIS — Z1211 Encounter for screening for malignant neoplasm of colon: Secondary | ICD-10-CM

## 2020-04-17 NOTE — Telephone Encounter (Signed)
Dez from [facility] called to discuss Devin Hale's request for an appeal for the hyalgan injection. I explained to her that the hyalgan was off the table and Dr. Holley Raring had discussed a genicular nerve block with the patient and he had agreed to have it done. He was scheduled for 04/16/20 for the GNB. He did not show up for this appointment. I asked Dr. Holley Raring if he was willing to r/s the patient for the GNB if he desired to. At this point because of all the issues and the no show,  Dr. Holley Raring feels it best for the patient to return to the previous doctor who was giving him the knee injections due to the fact that they are able to get a different medicine that his insurance would authorize. The [facility] rep expressed understanding and said she would notify the patient.

## 2020-04-18 ENCOUNTER — Telehealth: Payer: Self-pay

## 2020-04-18 NOTE — Telephone Encounter (Signed)
Called patient back and left a VM that I would be forwarding his concern to Dr. Caryl Comes and his nurse. I also informed him it would not be until early next week that we would be able to get back to him and encouraged him to call back if he had any further questions or concerns.

## 2020-04-18 NOTE — Telephone Encounter (Signed)
Patient calling.  Needs to have a tooth pulled.  Patient wants to know if they need to hold Eliquis or stay on it.   Patient does not have a date or where it will be done at.

## 2020-04-19 NOTE — Telephone Encounter (Signed)
C  good am   I dont have in my note ( my bad) his CHADSVASc score, and specifically if he has ever had a stroke If not then no big deal to get him off his anticoagulation for the procedure-- hold for 4 doses Thanks SK

## 2020-04-21 ENCOUNTER — Encounter
Admit: 2020-04-21 | Discharge: 2020-05-20 | Payer: MEDICARE | Attending: Radiation Oncology | Primary: Radiation Oncology

## 2020-04-21 ENCOUNTER — Encounter: Admit: 2020-04-21 | Discharge: 2020-05-20 | Payer: MEDICARE

## 2020-04-21 ENCOUNTER — Ambulatory Visit
Admit: 2020-04-21 | Discharge: 2020-04-22 | Payer: MEDICARE | Attending: Radiation Oncology | Primary: Radiation Oncology

## 2020-04-21 DIAGNOSIS — Z9889 Other specified postprocedural states: Principal | ICD-10-CM

## 2020-04-21 DIAGNOSIS — R001 Bradycardia, unspecified: Principal | ICD-10-CM

## 2020-04-21 DIAGNOSIS — C220 Liver cell carcinoma: Principal | ICD-10-CM

## 2020-04-21 DIAGNOSIS — R109 Unspecified abdominal pain: Principal | ICD-10-CM

## 2020-04-21 DIAGNOSIS — Z950 Presence of cardiac pacemaker: Principal | ICD-10-CM

## 2020-04-21 NOTE — Telephone Encounter (Signed)
Attempted to call the patient back.  No answer- I left a message on his voice mail that for simple tooth extractions, eliquis typically does not need to be held.   However, it would be up to the performing dentist if they feel they are wanting him to hold the blood thinner. I asked that he check with the dental office to confirm and if they are requesting he hold his blood thinner, to have them reach out to [place] directly.

## 2020-04-23 ENCOUNTER — Encounter: Admit: 2020-04-23 | Discharge: 2020-04-24 | Payer: MEDICARE | Attending: Family | Primary: Family

## 2020-04-23 ENCOUNTER — Telehealth: Payer: Self-pay | Admitting: Internal Medicine

## 2020-04-23 DIAGNOSIS — C220 Liver cell carcinoma: Principal | ICD-10-CM

## 2020-04-23 NOTE — Telephone Encounter (Signed)
Call received directly from scheduling today. The patient advised he was calling for 2 things:  1) He received his AVS in the mail today and ASA was still on his medication list. He thought Dr. Caryl Comes had advised him not to take this. I advised I reviewed Dr. Olin Pia note from his most recent telehealth visit and he states the patient was no longer taking ASA and did not recommend that he resume this.  I advised I will remove this from his list of medications.   2) He wanted to know the name of the doctor Dr. Caryl Comes was discussing with him at the [facility] clinic. He thought this was a Dr. Burt Knack. I advised I did see in his chart that Dr. Caryl Comes mentioned a Dr. Ruby Cola, but would need to clarify this with him and call him back.  The patient voiced understanding and was agreeable.    I did message Dr. Caryl Comes and he verified that this would be Dr. Sampson Si at the [facility] in [place], but that the patient has wanted to put this off until he had resolved his liver issues.   I have called and notified the patient of Dr. Antionette Char full name and location.  He was very appreciative for the call back.

## 2020-04-24 ENCOUNTER — Telehealth: Payer: Self-pay | Admitting: Internal Medicine

## 2020-04-24 NOTE — Telephone Encounter (Signed)
Patient calling in to report upcoming muscle biopsy surgery on 11/10 at [facility]. Patient advised to have preforming office fax a clearance form to our office

## 2020-04-24 NOTE — Telephone Encounter (Signed)
° °  [facility] Pre-operative Risk Assessment    HEARTCARE STAFF: - Please ensure there is not already an duplicate clearance open for this procedure. - Under Visit Info/Reason for Call, type in Other and utilize the format Clearance MM/DD/YY or Clearance TBD. Do not use dashes or single digits. - If request is for dental extraction, please clarify the # of teeth to be extracted.  Request for surgical clearance:  1. What type of surgery is being performed? Left Vastus lateralis muscle biopsy  2. When is this surgery scheduled? TBD  3. What type of clearance is required (medical clearance vs. Pharmacy clearance to hold med vs. Both)? both  4. Are there any medications that need to be held prior to surgery and how long?not listed, please advise if needed  5. Practice name and name of physician performing surgery? [facility] - Dr Audrie Gallus  6. What is the office phone number? [phone]   7.   What is the office fax number? [phone]  8.   Anesthesia type (None, local, MAC, general) ? MAC and local   Devin Hale 04/24/2020, 4:39 PM  _________________________________________________________________   (provider comments below)

## 2020-04-24 NOTE — Telephone Encounter (Signed)
Clinical pharmacist to review Eliquis 

## 2020-04-25 NOTE — Telephone Encounter (Signed)
Patient with diagnosis of afib on Eliquis for anticoagulation.    Procedure:  Left Vastus lateralis muscle biopsy Date of procedure: TBD    CHA2DS2-VASc Score = 2  This indicates a 2.2% annual risk of stroke. The patient's score is based upon: CHF History: 0 HTN History: 1 Diabetes History: 0 Stroke History: 0 Vascular Disease History: 0 Age Score: 1 Gender Score: 0     CrCl 75.9 ml/min  Per office protocol, patient can hold Eliquis for 2 days prior to procedure.

## 2020-04-25 NOTE — Telephone Encounter (Signed)
Called and spoke with patient. He stated procedure has been cancelled for the time being. He is unsure when it will be rescheduled for as there are several other things that are taking priority over this. Therefore, will remove from pre-op pool. I advised patient to just let us know when procedure has been rescheduled and we can complete pre-op risk assessment at that time.

## 2020-04-27 DIAGNOSIS — H903 Sensorineural hearing loss, bilateral: Principal | ICD-10-CM

## 2020-04-29 ENCOUNTER — Encounter: Admit: 2020-04-29 | Discharge: 2020-04-29 | Payer: MEDICARE

## 2020-04-29 ENCOUNTER — Encounter
Admit: 2020-04-29 | Discharge: 2020-04-29 | Payer: MEDICARE | Attending: Audiologist-Hearing Aid Fitter | Primary: Audiologist-Hearing Aid Fitter

## 2020-04-29 DIAGNOSIS — J343 Hypertrophy of nasal turbinates: Principal | ICD-10-CM

## 2020-04-29 DIAGNOSIS — J310 Chronic rhinitis: Principal | ICD-10-CM

## 2020-04-29 DIAGNOSIS — J339 Nasal polyp, unspecified: Principal | ICD-10-CM

## 2020-04-29 DIAGNOSIS — H903 Sensorineural hearing loss, bilateral: Principal | ICD-10-CM

## 2020-04-29 DIAGNOSIS — J324 Chronic pansinusitis: Principal | ICD-10-CM

## 2020-05-06 ENCOUNTER — Encounter: Admit: 2020-05-06 | Discharge: 2020-05-07 | Payer: MEDICARE

## 2020-05-07 DIAGNOSIS — Z950 Presence of cardiac pacemaker: Principal | ICD-10-CM

## 2020-05-07 DIAGNOSIS — Z9889 Other specified postprocedural states: Principal | ICD-10-CM

## 2020-05-07 DIAGNOSIS — R109 Unspecified abdominal pain: Principal | ICD-10-CM

## 2020-05-07 DIAGNOSIS — R001 Bradycardia, unspecified: Principal | ICD-10-CM

## 2020-05-07 DIAGNOSIS — C220 Liver cell carcinoma: Principal | ICD-10-CM

## 2020-05-09 ENCOUNTER — Telehealth: Payer: Self-pay

## 2020-05-09 NOTE — Telephone Encounter (Signed)
Returned patients call. LVM to call office back. 

## 2020-05-14 DIAGNOSIS — R001 Bradycardia, unspecified: Principal | ICD-10-CM

## 2020-05-14 DIAGNOSIS — Z9889 Other specified postprocedural states: Principal | ICD-10-CM

## 2020-05-14 DIAGNOSIS — C220 Liver cell carcinoma: Principal | ICD-10-CM

## 2020-05-14 DIAGNOSIS — Z950 Presence of cardiac pacemaker: Principal | ICD-10-CM

## 2020-05-14 DIAGNOSIS — R109 Unspecified abdominal pain: Principal | ICD-10-CM

## 2020-05-20 ENCOUNTER — Encounter: Payer: Self-pay | Admitting: Student in an Organized Health Care Education/Training Program

## 2020-05-20 NOTE — Progress Notes (Signed)
Practice dismissal. Letter sent.

## 2020-05-21 ENCOUNTER — Ambulatory Visit
Admit: 2020-05-21 | Discharge: 2020-06-20 | Payer: MEDICARE | Attending: Radiation Oncology | Primary: Radiation Oncology

## 2020-05-21 ENCOUNTER — Encounter
Admit: 2020-05-21 | Discharge: 2020-06-20 | Payer: MEDICARE | Attending: Radiation Oncology | Primary: Radiation Oncology

## 2020-05-21 ENCOUNTER — Encounter: Admit: 2020-05-21 | Discharge: 2020-06-20 | Payer: MEDICARE

## 2020-05-21 ENCOUNTER — Telehealth: Payer: Self-pay

## 2020-05-21 ENCOUNTER — Telehealth: Payer: Self-pay | Admitting: Internal Medicine

## 2020-05-21 DIAGNOSIS — I4891 Unspecified atrial fibrillation: Secondary | ICD-10-CM

## 2020-05-21 DIAGNOSIS — Z950 Presence of cardiac pacemaker: Principal | ICD-10-CM

## 2020-05-21 DIAGNOSIS — Z9889 Other specified postprocedural states: Principal | ICD-10-CM

## 2020-05-21 DIAGNOSIS — R001 Bradycardia, unspecified: Principal | ICD-10-CM

## 2020-05-21 DIAGNOSIS — R109 Unspecified abdominal pain: Principal | ICD-10-CM

## 2020-05-21 DIAGNOSIS — C220 Liver cell carcinoma: Principal | ICD-10-CM

## 2020-05-21 NOTE — Telephone Encounter (Signed)
Unable to get in touch with pt by mobile regarding his concern of stopping Eliquis before his colonoscopy on 12/7. LMTCB

## 2020-05-21 NOTE — Telephone Encounter (Signed)
Returned patients call. LVM to call office back. 

## 2020-05-21 NOTE — Telephone Encounter (Signed)
Patient is having a colonoscopy with Dr. Allen Norris at [place] and is unable to get in touch with his office. Patient is wanting to know if he can be advised on his Eliquis as his procedure is 12/7  Please advise

## 2020-05-21 NOTE — Telephone Encounter (Signed)
Pt called back, was able to speak with pt regarding his concern for stopping his daily Eliquis before his procedure on 12/7. Advised pt that Gertie Fey has not reached out to clinic or Dr. Caryl Comes at this time regarding stopping his Eliquis. Pt is upset with "lack of communication between doctors and organiation". Very apologetic to pt regarding his frustrations. Pt asked if we could call gastro to have recommendation sent over, explain that we are unable to meet that accomodation, but when we got the referral/request we would quickly act on it to make sure his Eliquis is stopped on time. Advised pt to keep calling the gastro office, explain according to phone notes that they have tried to reach out to him via mobile and LVM, however pt denies. Explained that according to encounters that his Eliquis was  Mention to be stopped for his past muscle biscopy that was cancel and his liver ablation back in Oct with Dr. Caryl Comes, but nothing recent with gastro. Otherwise pt reports that he will keep reaching out to gastro, advised to keep mobile close by so he will receive their call. Pt verbalized understanding, will call back with further concern or questions.

## 2020-05-22 ENCOUNTER — Telehealth: Payer: Self-pay | Admitting: Gastroenterology

## 2020-05-22 ENCOUNTER — Telehealth: Payer: Self-pay | Admitting: Internal Medicine

## 2020-05-22 DIAGNOSIS — I4891 Unspecified atrial fibrillation: Secondary | ICD-10-CM | POA: Insufficient documentation

## 2020-05-22 NOTE — Telephone Encounter (Signed)
   [facility] Pre-operative Risk Assessment    HEARTCARE STAFF: - Please ensure there is not already an duplicate clearance open for this procedure. - Under Visit Info/Reason for Call, type in Other and utilize the format Clearance MM/DD/YY or Clearance TBD. Do not use dashes or single digits. - If request is for dental extraction, please clarify the # of teeth to be extracted.  Request for surgical clearance:  What type of surgery is being performed? EGD AND COLONOSCOPY  1. When is this surgery scheduled? 05/27/20  2. What type of clearance is required (medical clearance vs. Pharmacy clearance to hold med vs. Both)? MEDICAL 3. Are there any medications that need to be held prior to surgery and how long? NO  4. Practice name and name of physician performing surgery? [facility] , PHYSICIAN NOT LISTED  5. What is the office phone number? [phone]   7.   What is the office fax number? [phone]  8.   Anesthesia type (None, local, MAC, general) ? GENERAL  Elissa Hefty 05/22/2020, 11:29 AM  _________________________________________________________________   (provider comments below)

## 2020-05-22 NOTE — Telephone Encounter (Signed)
New request to hold eliquis for colonoscopy.

## 2020-05-22 NOTE — Telephone Encounter (Signed)
Patients call has been returned.  We've just received blood thinner advice from Dr. Virl Axe per recommendations-patient has been advised to hold Eliquis 1-2 days prior to his colonoscopy.  Patient verbalized understanding and stated he will hold it 2 days prior to colonoscopy.  Thanks,  [place], [place]

## 2020-05-22 NOTE — Telephone Encounter (Signed)
Left message for patient to please contact [place] in regards to Clearance

## 2020-05-22 NOTE — Telephone Encounter (Signed)
CLEARANCE BELOW PER PATIENT REQUEST.  Please call patient

## 2020-05-22 NOTE — Telephone Encounter (Signed)
   [facility] Pre-operative Risk Assessment    HEARTCARE STAFF: - Please ensure there is not already an duplicate clearance open for this procedure. - Under Visit Info/Reason for Call, type in Other and utilize the format Clearance MM/DD/YY or Clearance TBD. Do not use dashes or single digits. - If request is for dental extraction, please clarify the # of teeth to be extracted.  Request for surgical clearance:  1. What type of surgery is being performed?  EGD Colonoscopy    2. When is this surgery scheduled? 05-27-20   3. What type of clearance is required (medical clearance vs. Pharmacy clearance to hold med vs. Both)?  Pharmacy   4. Are there any medications that need to be held prior to surgery and how long? eliquis and atorvastatin    5. Practice name and name of physician performing surgery? [facility]   6. What is the office phone number?  [phone]   7.   What is the office fax number? [phone]  8.   Anesthesia type (None, local, MAC, general) ? unknown   Clarisse Gouge 05/22/2020, 10:42 AM  _________________________________________________________________   (provider comments below)

## 2020-05-22 NOTE — Telephone Encounter (Signed)
     Pt returning call, he said pt already know clearance recommendations. If there's any concern we can call him back

## 2020-05-22 NOTE — Telephone Encounter (Signed)
Patient with diagnosis of afib on Eliquis for anticoagulation. Afib is not listed on patient's PMH, I have added it.  Procedure: colonoscopy Date of procedure: 05/27/20  CHA2DS2-VASc Score = 2  This indicates a 2.2% annual risk of stroke. The patient's score is based upon: CHF History: 0 HTN History: 1 Diabetes History: 0 Stroke History: 0 Vascular Disease History: 0 Age Score: 1 Gender Score: 0   CrCl 16mL/min Platelet count 104K  Per office protocol, patient can hold Eliquis for 1-2 days prior to procedure. No need for pt to hold atorvastatin prior to procedures.

## 2020-05-22 NOTE — Telephone Encounter (Signed)
   Primary Cardiologist: Virl Axe, MD  Chart reviewed as part of pre-operative protocol coverage.   We have been asked for guidance on holding eliquis for colonoscopy. Per our clinical pharmacist: Patient with diagnosis of afib on Eliquis for anticoagulation. Afib is not listed on patient's PMH, I have added it.  Procedure: colonoscopy Date of procedure: 05/27/20  CHA2DS2-VASc Score = 2  This indicates a 2.2% annual risk of stroke. The patient's score is based upon: CHF History: 0 HTN History: 1 Diabetes History: 0 Stroke History: 0 Vascular Disease History: 0 Age Score: 1 Gender Score: 0   CrCl 57mL/min Platelet count 104K  Per office protocol, patient can hold Eliquis for 1-2 days prior to procedure. No need for pt to hold atorvastatin prior to procedures.  I will route this recommendation to the requesting party via Epic fax function and remove from pre-op pool. Please call with questions.  Tami Lin Martia Dalby, PA 05/22/2020, 3:15 PM

## 2020-05-22 NOTE — Telephone Encounter (Signed)
Patient called and wants a call back within 15 min..he wants to know if Dr. Allen Norris has recieved results back from Dr Olin Pia office and wants to know when to stop taking his Eliquis. CMA was informed.

## 2020-05-22 NOTE — Telephone Encounter (Signed)
Has been addressed

## 2020-05-23 ENCOUNTER — Encounter
Admission: RE | Admit: 2020-05-23 | Discharge: 2020-05-23 | Disposition: A | Discharge status: Home / Self Care | Payer: 59 | Source: Ambulatory Visit | Attending: Gastroenterology | Admitting: Gastroenterology

## 2020-05-23 ENCOUNTER — Other Ambulatory Visit: Payer: Self-pay

## 2020-05-23 DIAGNOSIS — Z20822 Contact with and (suspected) exposure to covid-19: Secondary | ICD-10-CM | POA: Insufficient documentation

## 2020-05-23 DIAGNOSIS — Z01812 Encounter for preprocedural laboratory examination: Secondary | ICD-10-CM | POA: Diagnosis present

## 2020-05-24 ENCOUNTER — Other Ambulatory Visit: Payer: Self-pay | Admitting: Internal Medicine

## 2020-05-24 LAB — SARS CORONAVIRUS 2 (TAT 6-24 HRS): SARS Coronavirus 2: NEGATIVE

## 2020-05-27 ENCOUNTER — Ambulatory Visit: Payer: 59 | Admitting: Anesthesiology

## 2020-05-27 ENCOUNTER — Other Ambulatory Visit: Payer: Self-pay

## 2020-05-27 ENCOUNTER — Ambulatory Visit
Admission: RE | Admit: 2020-05-27 | Discharge: 2020-05-27 | Disposition: A | Discharge status: Home / Self Care | Payer: 59 | Attending: Gastroenterology | Admitting: Gastroenterology

## 2020-05-27 ENCOUNTER — Encounter
Admission: RE | Disposition: A | Discharge status: Home / Self Care | Payer: Self-pay | Source: Home / Self Care | Attending: Gastroenterology

## 2020-05-27 ENCOUNTER — Encounter: Payer: Self-pay | Admitting: Gastroenterology

## 2020-05-27 DIAGNOSIS — Z950 Presence of cardiac pacemaker: Secondary | ICD-10-CM | POA: Insufficient documentation

## 2020-05-27 DIAGNOSIS — Z79899 Other long term (current) drug therapy: Secondary | ICD-10-CM | POA: Insufficient documentation

## 2020-05-27 DIAGNOSIS — Z1211 Encounter for screening for malignant neoplasm of colon: Secondary | ICD-10-CM

## 2020-05-27 DIAGNOSIS — Z7901 Long term (current) use of anticoagulants: Secondary | ICD-10-CM | POA: Insufficient documentation

## 2020-05-27 DIAGNOSIS — I8500 Esophageal varices without bleeding: Secondary | ICD-10-CM

## 2020-05-27 DIAGNOSIS — Z7983 Long term (current) use of bisphosphonates: Secondary | ICD-10-CM | POA: Insufficient documentation

## 2020-05-27 DIAGNOSIS — K7460 Unspecified cirrhosis of liver: Secondary | ICD-10-CM | POA: Diagnosis not present

## 2020-05-27 HISTORY — PX: ESOPHAGOGASTRODUODENOSCOPY (EGD) WITH PROPOFOL: SHX5813

## 2020-05-27 HISTORY — PX: COLONOSCOPY WITH PROPOFOL: SHX5780

## 2020-05-27 SURGERY — COLONOSCOPY WITH PROPOFOL
Anesthesia: General

## 2020-05-27 MED ORDER — PROPOFOL 10 MG/ML IV BOLUS
INTRAVENOUS | Status: DC | PRN
  Administered 2020-05-27: 70 mg via INTRAVENOUS

## 2020-05-27 MED ORDER — SODIUM CHLORIDE 0.9 % IV SOLN
INTRAVENOUS | Status: DC
  Administered 2020-05-27: 1000 mL via INTRAVENOUS

## 2020-05-27 MED ORDER — PROPOFOL 500 MG/50ML IV EMUL
INTRAVENOUS | Status: DC | PRN
  Administered 2020-05-27: 140 ug/kg/min via INTRAVENOUS

## 2020-05-27 MED ORDER — LIDOCAINE HCL (CARDIAC) PF 100 MG/5ML IV SOSY
PREFILLED_SYRINGE | INTRAVENOUS | Status: DC | PRN
  Administered 2020-05-27: 100 mg via INTRAVENOUS

## 2020-05-27 NOTE — Op Note (Addendum)
[facility]enterology Patient Name: Devin Hale Procedure Date: 05/27/2020 10:23 AM MRN: [id number] Account #: [id number] Date of Birth: 01-28-1951 Admit Type: Outpatient Age: 69 Room: [facility] ENDO ROOM 4 Gender: Male Note Status: Supervisor Override Procedure:             Colonoscopy Indications:           Screening for colorectal malignant neoplasm Providers:             Lucilla Lame MD, MD Medicines:             Propofol per Anesthesia Complications:         No immediate complications. Procedure:             Pre-Anesthesia Assessment:                        - Prior to the procedure, a History and Physical was                         performed, and patient medications and allergies were                         reviewed. The patient's tolerance of previous                         anesthesia was also reviewed. The risks and benefits                         of the procedure and the sedation options and risks                         were discussed with the patient. All questions were                         answered, and informed consent was obtained. Prior                         Anticoagulants: The patient has taken no previous                         anticoagulant or antiplatelet agents. ASA Grade                         Assessment: II - A patient with mild systemic disease.                         After reviewing the risks and benefits, the patient                         was deemed in satisfactory condition to undergo the                         procedure.                        After obtaining informed consent, the colonoscope was                         passed under direct vision. Throughout the procedure,  the patient's blood pressure, pulse, and oxygen                         saturations were monitored continuously. The                         Colonoscope was introduced through the anus and                         advanced to the  the cecum, identified by appendiceal                         orifice and ileocecal valve. The colonoscopy was                         performed without difficulty. The patient tolerated                         the procedure well. The quality of the bowel                         preparation was excellent. Findings:      The perianal and digital rectal examinations were normal.      The colon (entire examined portion) appeared normal. Impression:            - The entire examined colon is normal.                        - No specimens collected. Recommendation:        - Discharge patient to home.                        - Resume previous diet.                        - Continue present medications.                        - Repeat colonoscopy in 10 years for screening                         purposes.                        - unless any change in family history or lower GI                         problems. Procedure Code(s):     --- Professional ---                        [phone], Colonoscopy, flexible; diagnostic, including                         collection of specimen(s) by brushing or washing, when                         performed (separate procedure) Diagnosis Code(s):     --- Professional ---  Z12.11, Encounter for screening for malignant neoplasm                         of colon CPT copyright 2019 American Medical Association. All rights reserved. The codes documented in this report are preliminary and upon coder review may  be revised to meet current compliance requirements. Lucilla Lame MD, MD 05/27/2020 11:20:36 AM This report has been signed electronically. Number of Addenda: 0 Note Initiated On: 05/27/2020 10:23 AM Scope Withdrawal Time: 0 hours 7 minutes 13 seconds  Total Procedure Duration: 0 hours 10 minutes 47 seconds  Estimated Blood Loss:  Estimated blood loss: none.      [facility]

## 2020-05-27 NOTE — Transfer of Care (Signed)
Immediate Anesthesia Transfer of Care Note  Patient: Devin Hale  Procedure(s) Performed: COLONOSCOPY WITH PROPOFOL (N/A ) ESOPHAGOGASTRODUODENOSCOPY (EGD) WITH PROPOFOL (N/A )  Patient Location: PACU  Anesthesia Type:General  Level of Consciousness: sedated  Airway & Oxygen Therapy: Patient Spontanous Breathing and Patient connected to nasal cannula oxygen  Post-op Assessment: Report given to RN and Post -op Vital signs reviewed and stable  Post vital signs: Reviewed and stable  Last Vitals:  Vitals Value Taken Time  BP    Temp    Pulse 74 05/27/20 1122  Resp 16 05/27/20 1122  SpO2 95 % 05/27/20 1122  Vitals shown include unvalidated device data.  Last Pain:  Vitals:   05/27/20 1014  PainSc: 0-No pain         Complications: No complications documented.

## 2020-05-27 NOTE — Anesthesia Preprocedure Evaluation (Signed)
Anesthesia Evaluation  Patient identified by MRN, date of birth, ID band Patient awake    Reviewed: Allergy & Precautions, H&P , NPO status , Patient's Chart, lab work & pertinent test results  History of Anesthesia Complications Negative for: history of anesthetic complications  Airway Mallampati: III  TM Distance: <3 FB Neck ROM: limited    Dental  (+) Chipped, Poor Dentition   Pulmonary neg pulmonary ROS, neg shortness of breath,    Pulmonary exam normal        Cardiovascular Exercise Tolerance: Good Normal cardiovascular exam+ dysrhythmias + pacemaker      Neuro/Psych negative neurological ROS  negative psych ROS   GI/Hepatic negative GI ROS, neg GERD  ,(+) Hepatitis -  Endo/Other  negative endocrine ROS  Renal/GU negative Renal ROS  negative genitourinary   Musculoskeletal  (+) Arthritis ,   Abdominal   Peds  Hematology negative hematology ROS (+)   Anesthesia Other Findings Past Medical History: No date: Complete heart block (HCC) No date: Hepatitis No date: Hepatitis C No date: Pacemaker  Past Surgical History: 1982: KNEE ARTHROSCOPY; Left No date: LIVER BIOPSY 2019: NASAL SINUS SURGERY 08/13/2019: TEMPORARY PACEMAKER; N/A     Comment:  Procedure: TEMPORARY PACEMAKER;  Surgeon: [name], MD;  Location: [facility];                Service: Cardiovascular;  Laterality: N/A; No date: TONSILLECTOMY  BMI    Body Mass Index: 23.81 kg/m      Reproductive/Obstetrics negative OB ROS                             Anesthesia Physical Anesthesia Plan  ASA: IV  Anesthesia Plan: General   Post-op Pain Management:    Induction: Intravenous  PONV Risk Score and Plan: Propofol infusion and TIVA  Airway Management Planned: Natural Airway and Nasal Cannula  Additional Equipment:   Intra-op Plan:   Post-operative Plan:   Informed Consent: I  have reviewed the patients History and Physical, chart, labs and discussed the procedure including the risks, benefits and alternatives for the proposed anesthesia with the patient or authorized representative who has indicated his/her understanding and acceptance.     Dental Advisory Given  Plan Discussed with: Anesthesiologist, CRNA and Surgeon  Anesthesia Plan Comments: (Patient consented for risks of anesthesia including but not limited to:  - adverse reactions to medications - risk of airway placement if required - damage to eyes, teeth, lips or other oral mucosa - nerve damage due to positioning  - sore throat or hoarseness - Damage to heart, brain, nerves, lungs, other parts of body or loss of life  Patient voiced understanding.)        Anesthesia Quick Evaluation

## 2020-05-27 NOTE — Op Note (Signed)
[facility]astroenter[facility] Patient Name: Devin Hale Procedure Date: 05/27/2020 10:23 AM MRN: [id number] Account #: [id number] Date of Birth: 03/02/1951 Admit Type: Outpatient Age: 69 Room: [facility] ENDO ROOM 4 Gender: Male Note Status: Finalized Procedure:             Upper GI endoscopy Indications:           Cirrhosis rule out esophageal varices Providers:             Lucilla Lame MD, MD Medicines:             Propofol per Anesthesia Complications:         No immediate complications. Procedure:             Pre-Anesthesia Assessment:                        - Prior to the procedure, a History and Physical was                         performed, and patient medications and allergies were                         reviewed. The patient's tolerance of previous                         anesthesia was also reviewed. The risks and benefits                         of the procedure and the sedation options and risks                         were discussed with the patient. All questions were                         answered, and informed consent was obtained. Prior                         Anticoagulants: The patient has taken no previous                         anticoagulant or antiplatelet agents. ASA Grade                         Assessment: II - A patient with mild systemic disease.                         After reviewing the risks and benefits, the patient                         was deemed in satisfactory condition to undergo the                         procedure.                        After obtaining informed consent, the endoscope was                         passed under direct vision. Throughout the procedure,  the patient's blood pressure, pulse, and oxygen                         saturations were monitored continuously. The Endoscope                         was introduced through the mouth, and advanced to the                         second part  of duodenum. The upper GI endoscopy was                         accomplished without difficulty. The patient tolerated                         the procedure well. Findings:      The esophagus was normal.      The stomach was normal.      The examined duodenum was normal. Impression:            - Normal esophagus.                        - Normal stomach.                        - Normal examined duodenum.                        - No specimens collected. Recommendation:        - Discharge patient to home.                        - Resume previous diet.                        - Perform a colonoscopy today. Procedure Code(s):     --- Professional ---                        [phone], Esophagogastroduodenoscopy, flexible,                         transoral; diagnostic, including collection of                         specimen(s) by brushing or washing, when performed                         (separate procedure) Diagnosis Code(s):     --- Professional ---                        K74.60, Unspecified cirrhosis of liver CPT copyright 2019 American Medical Association. All rights reserved. The codes documented in this report are preliminary and upon coder review may  be revised to meet current compliance requirements. Lucilla Lame MD, MD 05/27/2020 11:03:41 AM This report has been signed electronically. Number of Addenda: 0 Note Initiated On: 05/27/2020 10:23 AM Estimated Blood Loss:  Estimated blood loss: none.      [facility]

## 2020-05-27 NOTE — H&P (Signed)
Lucilla Lame, MD [address] [place], [place] [address] Phone:[phone] Fax : [phone]  Primary Care Physician:  Marguerita Merles, MD Primary Gastroenterologist:  Dr. Allen Norris  Pre-Procedure History & Physical: HPI:  Devin Hale is a 69 y.o. male is here for an endoscopy and colonoscopy.   Past Medical History:  Diagnosis Date  . Complete heart block ([place])   . Hepatitis   . Hepatitis C   . Pacemaker     Past Surgical History:  Procedure Laterality Date  . KNEE ARTHROSCOPY Left 1982  . LIVER BIOPSY    . NASAL SINUS SURGERY  2019  . TEMPORARY PACEMAKER N/A 08/13/2019   Procedure: TEMPORARY PACEMAKER;  Surgeon: [name], MD;  Location: [facility];  Service: Cardiovascular;  Laterality: N/A;  . TONSILLECTOMY      Prior to Admission medications   Medication Sig Start Date End Date Taking? Authorizing Provider  acetaminophen (TYLENOL) 500 MG tablet Take 1 tablet (500 mg total) by mouth every 4 (four) hours as needed for moderate pain. 10/02/19 10/01/20 Yes Duffy Bruce, MD  alendronate (FOSAMAX) 70 MG tablet Take 70 mg by mouth once a week. 01/24/19  Yes [provider]  ascorbic acid (VITAMIN C) 500 MG tablet Take 500 mg by mouth 2 (two) times daily.    Yes [provider]  atorvastatin (LIPITOR) 40 MG tablet Take 1 tablet by mouth once daily 05/26/20  Yes Deboraha Sprang, MD  cholecalciferol (VITAMIN D3) 25 MCG (1000 UNIT) tablet Take 1,000 Units by mouth in the morning and at bedtime.    Yes [provider]  Coenzyme Q10 (CO Q 10) 100 MG CAPS Take 100 mg by mouth daily.   Yes [provider]  Docosahexaenoic Acid (DHA COMPLETE PO) Take 500 mg by mouth daily.   Yes [provider]  magnesium oxide (MAG-OX) 400 MG tablet Take 200 mg by mouth 2 (two) times daily.   Yes [provider]  Multiple Vitamin (MULTIVITAMIN WITH MINERALS) TABS tablet Take 1 tablet by mouth daily.   Yes [provider]  Omega-3 Fatty Acids (FISH OIL) 1000 MG CAPS Take by mouth daily.   Yes [provider]  vitamin E (VITAMIN E) 200 UNIT capsule Take 200 Units by mouth daily.   Yes [provider]  Whey Protein POWD Take by mouth daily.   Yes [provider]  ELIQUIS 5 MG TABS tablet Take 5 mg by mouth 2 (two) times daily. 09/27/19   [provider]    Allergies as of 04/17/2020  . (No Known Allergies)    Family History  Problem Relation Age of Onset  . Non-Hodgkin's lymphoma Mother   . Heart attack Father     Social History   Socioeconomic History  . Marital status: Single    Spouse name: Not on file  . Number of children: Not on file  . Years of education: Not on file  . Highest education level: Not on file  Occupational History  . Not on file  Tobacco Use  . Smoking status: Never Smoker  . Smokeless tobacco: Never Used  Vaping Use  . Vaping Use: Never used  Substance and Sexual Activity  . Alcohol use: No  . Drug use: Never  . Sexual activity: Not on file  Other Topics Concern  . Not on file  Social History Narrative  . Not on file   Social Determinants of Health   Financial Resource  Strain:   . Difficulty of Paying Living Expenses: Not on file  Food Insecurity:   . Worried About Charity fundraiser in the Last Year: Not on file  . Ran Out of Food in the Last Year: Not on file  Transportation Needs:   . Lack of Transportation (Medical): Not on file  . Lack of Transportation (Non-Medical): Not on file  Physical Activity:   . Days of Exercise per Week: Not on file  . Minutes of Exercise per Session: Not on file  Stress:   . Feeling of Stress : Not on file  Social Connections:   . Frequency of Communication with Friends and Family: Not on file  . Frequency of Social Gatherings with Friends and Family: Not on file  . Attends Religious Services: Not on file  . Active Member of Clubs or Organizations: Not on file  . Attends  Archivist Meetings: Not on file  . Marital Status: Not on file  Intimate Partner Violence:   . Fear of Current or Ex-Partner: Not on file  . Emotionally Abused: Not on file  . Physically Abused: Not on file  . Sexually Abused: Not on file    Review of Systems: See HPI, otherwise negative ROS  Physical Exam: BP 124/86   Pulse 80   Temp (!) 96.6 F (35.9 C)   Resp 18   Ht 5' 10.5" (1.791 m)   Wt 76.4 kg   SpO2 100%   BMI 23.81 kg/m  General:   Alert,  pleasant and cooperative in NAD Head:  Normocephalic and atraumatic. Neck:  Supple; no masses or thyromegaly. Lungs:  Clear throughout to auscultation.    Heart:  Regular rate and rhythm. Abdomen:  Soft, nontender and nondistended. Normal bowel sounds, without guarding, and without rebound.   Neurologic:  Alert and  oriented x4;  grossly normal neurologically.  Impression/Plan: Devin Hale is here for an endoscopy and colonoscopy to be performed for cirrhosis and screening  Risks, benefits, limitations, and alternatives regarding  endoscopy and colonoscopy have been reviewed with the patient.  Questions have been answered.  All parties agreeable.   Lucilla Lame, MD  05/27/2020, 10:31 AM

## 2020-05-27 NOTE — Anesthesia Postprocedure Evaluation (Signed)
Anesthesia Post Note  Patient: Devin Hale  Procedure(s) Performed: COLONOSCOPY WITH PROPOFOL (N/A ) ESOPHAGOGASTRODUODENOSCOPY (EGD) WITH PROPOFOL (N/A )  Patient location during evaluation: Endoscopy Anesthesia Type: General Level of consciousness: awake and alert Pain management: pain level controlled Vital Signs Assessment: post-procedure vital signs reviewed and stable Respiratory status: spontaneous breathing, nonlabored ventilation, respiratory function stable and patient connected to nasal cannula oxygen Cardiovascular status: blood pressure returned to baseline and stable Postop Assessment: no apparent nausea or vomiting Anesthetic complications: no   No complications documented.   Last Vitals:  Vitals:   05/27/20 1142 05/27/20 1150  BP: 99/86 115/75  Pulse: 61 60  Resp: 20 15  Temp:    SpO2: 100% 100%    Last Pain:  Vitals:   05/27/20 1150  TempSrc:   PainSc: 0-No pain                 Precious Haws Kaiel Weide

## 2020-05-28 ENCOUNTER — Encounter: Payer: Self-pay | Admitting: Gastroenterology

## 2020-06-03 DIAGNOSIS — R109 Unspecified abdominal pain: Principal | ICD-10-CM

## 2020-06-03 DIAGNOSIS — Z9889 Other specified postprocedural states: Principal | ICD-10-CM

## 2020-06-03 DIAGNOSIS — R001 Bradycardia, unspecified: Principal | ICD-10-CM

## 2020-06-03 DIAGNOSIS — C220 Liver cell carcinoma: Principal | ICD-10-CM

## 2020-06-03 DIAGNOSIS — Z950 Presence of cardiac pacemaker: Principal | ICD-10-CM

## 2020-06-05 DIAGNOSIS — R109 Unspecified abdominal pain: Principal | ICD-10-CM

## 2020-06-05 DIAGNOSIS — R001 Bradycardia, unspecified: Principal | ICD-10-CM

## 2020-06-05 DIAGNOSIS — Z950 Presence of cardiac pacemaker: Principal | ICD-10-CM

## 2020-06-05 DIAGNOSIS — C220 Liver cell carcinoma: Principal | ICD-10-CM

## 2020-06-05 DIAGNOSIS — Z9889 Other specified postprocedural states: Principal | ICD-10-CM

## 2020-06-06 ENCOUNTER — Ambulatory Visit (INDEPENDENT_AMBULATORY_CARE_PROVIDER_SITE_OTHER): Payer: 59

## 2020-06-06 DIAGNOSIS — I442 Atrioventricular block, complete: Secondary | ICD-10-CM | POA: Diagnosis not present

## 2020-06-06 LAB — CUP PACEART REMOTE DEVICE CHECK
Battery Remaining Longevity: 102 mo
Battery Remaining Percentage: 95.5 %
Battery Voltage: 3.01 V
Brady Statistic AP VP Percent: 18 %
Brady Statistic AP VS Percent: 1.1 %
Brady Statistic AS VP Percent: 76 %
Brady Statistic AS VS Percent: 4.8 %
Brady Statistic RA Percent Paced: 18 %
Brady Statistic RV Percent Paced: 93 %
Date Time Interrogation Session: 20211217020012
Implantable Lead Implant Date: 20210224
Implantable Lead Implant Date: 20210224
Implantable Lead Location: 753859
Implantable Lead Location: 753860
Implantable Lead Model: 7842
Implantable Lead Serial Number: 1032895
Implantable Pulse Generator Implant Date: 20210224
Lead Channel Impedance Value: 430 Ohm
Lead Channel Impedance Value: 550 Ohm
Lead Channel Pacing Threshold Amplitude: 0.75 V
Lead Channel Pacing Threshold Amplitude: 0.875 V
Lead Channel Pacing Threshold Pulse Width: 1 ms
Lead Channel Pacing Threshold Pulse Width: 1 ms
Lead Channel Sensing Intrinsic Amplitude: 2 mV
Lead Channel Sensing Intrinsic Amplitude: 4.3 mV
Lead Channel Setting Pacing Amplitude: 1.125
Lead Channel Setting Pacing Amplitude: 2 V
Lead Channel Setting Pacing Pulse Width: 1 ms
Lead Channel Setting Sensing Sensitivity: 2 mV
Pulse Gen Model: 2272
Pulse Gen Serial Number: 9197143

## 2020-06-09 DIAGNOSIS — Z950 Presence of cardiac pacemaker: Principal | ICD-10-CM

## 2020-06-09 DIAGNOSIS — C220 Liver cell carcinoma: Principal | ICD-10-CM

## 2020-06-09 DIAGNOSIS — R001 Bradycardia, unspecified: Principal | ICD-10-CM

## 2020-06-09 DIAGNOSIS — Z9889 Other specified postprocedural states: Principal | ICD-10-CM

## 2020-06-09 DIAGNOSIS — R109 Unspecified abdominal pain: Principal | ICD-10-CM

## 2020-06-18 ENCOUNTER — Telehealth: Payer: Self-pay | Admitting: Internal Medicine

## 2020-06-18 NOTE — Telephone Encounter (Signed)
Attempted to return pt phone call, no answer.  LVM advising pt he needs to make sure that MRI facility is aware of pacemaker, they should reach out to [place] for additional information.

## 2020-06-18 NOTE — Telephone Encounter (Signed)
Patient states he has an appointment at [facility] for an MRI on 09/08/2020, and would like to discuss if he needs to do anything due to his pacemaker. Please call to discuss.

## 2020-06-19 NOTE — Progress Notes (Signed)
Remote pacemaker transmission.   

## 2020-07-02 ENCOUNTER — Telehealth: Payer: Self-pay | Admitting: Internal Medicine

## 2020-07-02 NOTE — Telephone Encounter (Signed)
Patient calling to discuss upcoming appt and possibly deferring.    Please call.

## 2020-07-02 NOTE — Telephone Encounter (Signed)
I called and spoke with the patient. He is scheduled to see Dr. Caryl Comes on 07/22/20, but due to the rise in [place] #'s he would like to defer this until March.  I advised the patient we could see him in an 8:20 am/ 8:40 am slot several days in March, but I have nothing later in the morning at this time until 09/30/20.  The patient states he is stable and would like to defer his appointment until 09/30/20 at 11:20 am. I have advised the patient to call sooner with any questions/ concerns.  He voices understanding and is agreeable.

## 2020-07-22 ENCOUNTER — Encounter: Payer: 59 | Admitting: Internal Medicine

## 2020-08-20 DIAGNOSIS — C220 Liver cell carcinoma: Principal | ICD-10-CM

## 2020-08-20 DIAGNOSIS — Z1321 Encounter for screening for nutritional disorder: Principal | ICD-10-CM

## 2020-08-20 DIAGNOSIS — Z8619 Personal history of other infectious and parasitic diseases: Principal | ICD-10-CM

## 2020-08-20 DIAGNOSIS — K7460 Unspecified cirrhosis of liver: Principal | ICD-10-CM

## 2020-09-04 ENCOUNTER — Telehealth: Payer: Self-pay | Admitting: Internal Medicine

## 2020-09-04 NOTE — Telephone Encounter (Signed)
Please call to discuss Amyloidosis and Sarcoidosis. Patient wants to make sure he understood Dr. Caryl Comes correctly.

## 2020-09-04 NOTE — Telephone Encounter (Signed)
I spoke with the patient. He called to inquire about amyloidosis vs sarcoidosis. He states Dr. Caryl Comes had discussed these terms with him, but he thought it was 1 diagnosis. He advised he has been calling the [facility] to try to get some education on this and was told it would be 1 or the other. I advised him that is correct that amyloidosis and sarcoidosis are 2 different diagnosis.   I explained to him that I reviewed his Cardiac MRI from [place] (03/04/20): CONCLUSION. Normal LV systolic function. 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 Some of the  lesions could also reflect distal, secondary branch CAD. Could consider followup study in 12 months to  monitor progression or stability of the myocardial regions of hyperenhancement.    The patient advised he will focus more on educating himself regarding sarcoidosis in preparation for his appointment with Dr. Caryl Comes on 09/30/20.  He was very appreciative for the call back.

## 2020-09-05 ENCOUNTER — Ambulatory Visit (INDEPENDENT_AMBULATORY_CARE_PROVIDER_SITE_OTHER): Payer: 59

## 2020-09-05 DIAGNOSIS — I442 Atrioventricular block, complete: Secondary | ICD-10-CM

## 2020-09-05 LAB — CUP PACEART REMOTE DEVICE CHECK
Battery Remaining Longevity: 100 mo
Battery Remaining Percentage: 95.5 %
Battery Voltage: 3.01 V
Brady Statistic AP VP Percent: 16 %
Brady Statistic AP VS Percent: 1 %
Brady Statistic AS VP Percent: 80 %
Brady Statistic AS VS Percent: 3.4 %
Brady Statistic RA Percent Paced: 15 %
Brady Statistic RV Percent Paced: 95 %
Date Time Interrogation Session: 20220318020013
Implantable Lead Implant Date: 20210224
Implantable Lead Implant Date: 20210224
Implantable Lead Location: 753859
Implantable Lead Location: 753860
Implantable Lead Model: 7842
Implantable Lead Serial Number: 1032895
Implantable Pulse Generator Implant Date: 20210224
Lead Channel Impedance Value: 430 Ohm
Lead Channel Impedance Value: 560 Ohm
Lead Channel Pacing Threshold Amplitude: 1 V
Lead Channel Pacing Threshold Amplitude: 1.25 V
Lead Channel Pacing Threshold Pulse Width: 1 ms
Lead Channel Pacing Threshold Pulse Width: 1 ms
Lead Channel Sensing Intrinsic Amplitude: 2.6 mV
Lead Channel Sensing Intrinsic Amplitude: 6.8 mV
Lead Channel Setting Pacing Amplitude: 1.25 V
Lead Channel Setting Pacing Amplitude: 2.5 V
Lead Channel Setting Pacing Pulse Width: 1 ms
Lead Channel Setting Sensing Sensitivity: 2 mV
Pulse Gen Model: 2272
Pulse Gen Serial Number: 9197143

## 2020-09-12 ENCOUNTER — Encounter: Admit: 2020-09-12 | Discharge: 2020-09-13 | Payer: MEDICARE

## 2020-09-12 NOTE — Progress Notes (Signed)
Remote pacemaker transmission.   

## 2020-09-16 ENCOUNTER — Encounter
Admit: 2020-09-16 | Discharge: 2020-09-18 | Payer: MEDICARE | Attending: Radiation Oncology | Primary: Radiation Oncology

## 2020-09-23 ENCOUNTER — Encounter: Admit: 2020-09-23 | Discharge: 2020-09-24 | Payer: MEDICARE | Attending: Family | Primary: Family

## 2020-09-23 ENCOUNTER — Other Ambulatory Visit: Payer: Self-pay

## 2020-09-24 ENCOUNTER — Encounter: Admit: 2020-09-24 | Discharge: 2020-09-25 | Payer: MEDICARE

## 2020-09-24 DIAGNOSIS — K7460 Unspecified cirrhosis of liver: Principal | ICD-10-CM

## 2020-09-25 ENCOUNTER — Encounter: Payer: Self-pay | Admitting: Gastroenterology

## 2020-09-25 ENCOUNTER — Other Ambulatory Visit: Payer: Self-pay

## 2020-09-25 ENCOUNTER — Ambulatory Visit (INDEPENDENT_AMBULATORY_CARE_PROVIDER_SITE_OTHER): Payer: 59 | Admitting: Gastroenterology

## 2020-09-25 VITALS — BP 125/77 | HR 75 | Temp 98.0°F | Ht 70.5 in | Wt 177.1 lb

## 2020-09-25 DIAGNOSIS — K642 Third degree hemorrhoids: Secondary | ICD-10-CM | POA: Diagnosis not present

## 2020-09-25 MED ORDER — HYDROCORTISONE (PERIANAL) 2.5 % EX CREA: 1.0000 "application " | TOPICAL_CREAM | Freq: Two times a day (BID) | CUTANEOUS | 0 refills | Status: DC

## 2020-09-25 NOTE — Patient Instructions (Signed)
Miralax 1-2 times a day   High-Fiber Eating Plan Fiber, also called dietary fiber, is a type of carbohydrate. It is found foods such as fruits, vegetables, whole grains, and beans. A high-fiber diet can have many health benefits. Your health care provider may recommend a high-fiber diet to help:  Prevent constipation. Fiber can make your bowel movements more regular.  Lower your cholesterol.  Relieve the following conditions: ? Inflammation of veins in the anus (hemorrhoids). ? Inflammation of specific areas of the digestive tract (uncomplicated diverticulosis). ? A problem of the large intestine, also called the colon, that sometimes causes pain and diarrhea (irritable bowel syndrome, or IBS).  Prevent overeating as part of a weight-loss plan.  Prevent heart disease, type 2 diabetes, and certain cancers. What are tips for following this plan? Reading food labels  Check the nutrition facts label on food products for the amount of dietary fiber. Choose foods that have 5 grams of fiber or more per serving.  The goals for recommended daily fiber intake include: ? Men (age 84 or younger): 34-38 g. ? Men (over age 64): 28-34 g. ? Women (age 52 or younger): 25-28 g. ? Women (over age 9): 22-25 g. Your daily fiber goal is _____________ g.   Shopping  Choose whole fruits and vegetables instead of processed forms, such as apple juice or applesauce.  Choose a wide variety of high-fiber foods such as avocados, lentils, oats, and kidney beans.  Read the nutrition facts label of the foods you choose. Be aware of foods with added fiber. These foods often have high sugar and sodium amounts per serving. Cooking  Use whole-grain flour for baking and cooking.  Cook with brown rice instead of white rice. Meal planning  Start the day with a breakfast that is high in fiber, such as a cereal that contains 5 g of fiber or more per serving.  Eat breads and cereals that are made with whole-grain  flour instead of refined flour or white flour.  Eat brown rice, bulgur wheat, or millet instead of white rice.  Use beans in place of meat in soups, salads, and pasta dishes.  Be sure that half of the grains you eat each day are whole grains. General information  You can get the recommended daily intake of dietary fiber by: ? Eating a variety of fruits, vegetables, grains, nuts, and beans. ? Taking a fiber supplement if you are not able to take in enough fiber in your diet. It is better to get fiber through food than from a supplement.  Gradually increase how much fiber you consume. If you increase your intake of dietary fiber too quickly, you may have bloating, cramping, or gas.  Drink plenty of water to help you digest fiber.  Choose high-fiber snacks, such as berries, raw vegetables, nuts, and popcorn. What foods should I eat? Fruits Berries. Pears. Apples. Oranges. Avocado. Prunes and raisins. Dried figs. Vegetables Sweet potatoes. Spinach. Kale. Artichokes. Cabbage. Broccoli. Cauliflower. Green peas. Carrots. Squash. Grains Whole-grain breads. Multigrain cereal. Oats and oatmeal. Brown rice. Barley. Bulgur wheat. [place]. Quinoa. Bran muffins. Popcorn. Rye wafer crackers. Meats and other proteins Navy beans, kidney beans, and pinto beans. Soybeans. Split peas. Lentils. Nuts and seeds. Dairy Fiber-fortified yogurt. Beverages Fiber-fortified soy milk. Fiber-fortified orange juice. Other foods Fiber bars. The items listed above may not be a complete list of recommended foods and beverages. Contact a dietitian for more information. What foods should I avoid? Fruits Fruit juice. Cooked, strained fruit. Vegetables  Fried potatoes. Canned vegetables. Well-cooked vegetables. Grains White bread. Pasta made with refined flour. White rice. Meats and other proteins Fatty cuts of meat. Fried chicken or fried fish. Dairy Milk. Yogurt. Cream cheese. Sour cream. Fats and  oils Butters. Beverages Soft drinks. Other foods Cakes and pastries. The items listed above may not be a complete list of foods and beverages to avoid. Talk with your dietitian about what choices are best for you. Summary  Fiber is a type of carbohydrate. It is found in foods such as fruits, vegetables, whole grains, and beans.  A high-fiber diet has many benefits. It can help to prevent constipation, lower blood cholesterol, aid weight loss, and reduce your risk of heart disease, diabetes, and certain cancers.  Increase your intake of fiber gradually. Increasing fiber too quickly may cause cramping, bloating, and gas. Drink plenty of water while you increase the amount of fiber you consume.  The best sources of fiber include whole fruits and vegetables, whole grains, nuts, seeds, and beans. This information is not intended to replace advice given to you by your health care provider. Make sure you discuss any questions you have with your health care provider. Document Revised: 10/11/2019 Document Reviewed: 10/11/2019 Elsevier Patient Education  2021 [employer].

## 2020-09-25 NOTE — Progress Notes (Signed)
[name], MD [address]  [place], [place] [address]  Main: [phone]  Fax: [phone]    Gastroenterology Consultation  Referring Provider:     Marguerita Merles, MD Primary Care Physician:  Marguerita Merles, MD Primary Gastroenterologist:  Dr. Lucilla Lame and Ebony Cargo, NP, hepatology, [facility] Reason for Consultation:     Symptomatic hemorrhoids        HPI:   Devin Hale is a 70 y.o. male referred by Dr. Lennox Grumbles, Connye Burkitt, MD  for consultation & management of symptomatic hemorrhoids.  Patient has history of chronic hepatitis C s/p treatment, well compensated cirrhosis, history of HCC s/p radiofrequency ablation, history of complete heart block s/p pacemaker, A. fib on Eliquis.  Patient is referred to me by Dr. Allen Norris for management of symptomatic hemorrhoids.  Patient reports that he has been having hemorrhoidal symptoms from his 74s, underwent hemorrhoid ligation in his 31s.  Ever since patient has been on Eliquis, he has been experiencing ongoing hemorrhoidal symptoms, predominantly rectal bleeding, bright red blood per rectum on wiping and sometimes dripping into the toilet bowel, associated with prolapse sometimes needing manual reduction, itching, rectal pressure/swelling and discomfort.  He tried Preparation H with minimal relief.  Although he denies constipation, his bowel movements are associated with significant straining intermittently as well as sensation of incomplete emptying.  He does acknowledge spending 20 to 30 minutes on toilet every time he has a BM.  NSAIDs: None  Antiplts/Anticoagulants/Anti thrombotics: Eliquis for history of A. fib  GI Procedures:  EGD and colonoscopy 05/27/2020 for history of cirrhosis and colon cancer screening - Normal esophagus. - Normal stomach. - Normal examined duodenum. - No specimens collected.  The perianal and digital rectal examinations were normal. The colon (entire examined portion) appeared normal.  Past  Medical History:  Diagnosis Date  . Complete heart block ([place])   . Hepatitis   . Hepatitis C   . Pacemaker     Past Surgical History:  Procedure Laterality Date  . COLONOSCOPY WITH PROPOFOL N/A 05/27/2020   Procedure: COLONOSCOPY WITH PROPOFOL;  Surgeon: Lucilla Lame, MD;  Location: [facility] ENDOSCOPY;  Service: Endoscopy;  Laterality: N/A;  . ESOPHAGOGASTRODUODENOSCOPY (EGD) WITH PROPOFOL N/A 05/27/2020   Procedure: ESOPHAGOGASTRODUODENOSCOPY (EGD) WITH PROPOFOL;  Surgeon: Lucilla Lame, MD;  Location: [facility] ENDOSCOPY;  Service: Endoscopy;  Laterality: N/A;  . KNEE ARTHROSCOPY Left 1982  . LIVER BIOPSY    . NASAL SINUS SURGERY  2019  . TEMPORARY PACEMAKER N/A 08/13/2019   Procedure: TEMPORARY PACEMAKER;  Surgeon: [name], MD;  Location: [facility] LAB;  Service: Cardiovascular;  Laterality: N/A;  . TONSILLECTOMY      Current Outpatient Medications:  .  acetaminophen (TYLENOL) 500 MG tablet, Take 1 tablet (500 mg total) by mouth every 4 (four) hours as needed for moderate pain., Disp: 30 tablet, Rfl: 0 .  alendronate (FOSAMAX) 70 MG tablet, Take 70 mg by mouth once a week., Disp: , Rfl:  .  ascorbic acid (VITAMIN C) 500 MG tablet, Take 500 mg by mouth 2 (two) times daily. , Disp: , Rfl:  .  atorvastatin (LIPITOR) 40 MG tablet, Take 1 tablet by mouth once daily, Disp: 90 tablet, Rfl: 3 .  cholecalciferol (VITAMIN D3) 25 MCG (1000 UNIT) tablet, Take 1,000 Units by mouth in the morning and at bedtime. , Disp: , Rfl:  .  Coenzyme Q10 (CO Q 10) 100 MG CAPS, Take 100 mg by mouth daily., Disp: ,  Rfl:  .  diclofenac Sodium (VOLTAREN) 1 % GEL, Apply topically., Disp: , Rfl:  .  Docosahexaenoic Acid (DHA COMPLETE PO), Take 500 mg by mouth daily., Disp: , Rfl:  .  ELIQUIS 5 MG TABS tablet, Take 5 mg by mouth 2 (two) times daily., Disp: , Rfl:  .  hydrocortisone (ANUSOL-HC) 2.5 % rectal cream, Place 1 application rectally 2 (two) times daily., Disp: 30 g, Rfl: 0 .  magnesium oxide  (MAG-OX) 400 MG tablet, Take 200 mg by mouth 2 (two) times daily., Disp: , Rfl:  .  Multiple Vitamin (MULTIVITAMIN WITH MINERALS) TABS tablet, Take 1 tablet by mouth daily., Disp: , Rfl:  .  Omega-3 Fatty Acids (FISH OIL) 1000 MG CAPS, Take by mouth daily., Disp: , Rfl:  .  ondansetron (ZOFRAN-ODT) 4 MG disintegrating tablet, Take 4 mg by mouth every 8 (eight) hours as needed., Disp: , Rfl:  .  vitamin E 200 UNIT capsule, Take 200 Units by mouth daily., Disp: , Rfl:  .  Vitamins-Lipotropics (COMPLEX B-100-INOSITOL) TBCR, Take by mouth., Disp: , Rfl:  .  Whey Protein POWD, Take by mouth daily., Disp: , Rfl:    Family History  Problem Relation Age of Onset  . Non-Hodgkin's lymphoma Mother   . Heart attack Father      Social History   Tobacco Use  . Smoking status: Never Smoker  . Smokeless tobacco: Never Used  Vaping Use  . Vaping Use: Never used  Substance Use Topics  . Alcohol use: No  . Drug use: Never    Allergies as of 09/25/2020  . (No Known Allergies)    Review of Systems:    All systems reviewed and negative except where noted in HPI.   Physical Exam:  BP 125/77 (BP Location: Left Arm, Patient Position: Sitting, Cuff Size: Normal)   Pulse 75   Temp 98 F (36.7 C) (Oral)   Ht 5' 10.5" (1.791 m)   Wt 177 lb 2 oz (80.3 kg)   BMI 25.06 kg/m  No LMP for male patient.  General:   Alert,  Well-developed, well-nourished, pleasant and cooperative in NAD Head:  Normocephalic and atraumatic. Eyes:  Sclera clear, no icterus.   Conjunctiva pink. Ears:  Normal auditory acuity. Nose:  No deformity, discharge, or lesions. Mouth:  No deformity or lesions,oropharynx pink & moist. Neck:  Supple; no masses or thyromegaly. Lungs:  Respirations even and unlabored.  Clear throughout to auscultation.   No wheezes, crackles, or rhonchi. No acute distress. Heart:  Regular rate and rhythm; no murmurs, clicks, rubs, or gallops. Abdomen:  Normal bowel sounds. Soft, non-tender and  non-distended without masses, hepatosplenomegaly or hernias noted.  No guarding or rebound tenderness.   Rectal: Large external hemorrhoids prolapsed, grade 2-3, nontender Msk:  Symmetrical without gross deformities. Good, equal movement & strength bilaterally. Pulses:  Normal pulses noted. Extremities:  No clubbing or edema.  No cyanosis. Neurologic:  Alert and oriented x3;  grossly normal neurologically. Skin:  Intact without significant lesions or rashes. No jaundice. Psych:  Alert and cooperative. Normal mood and affect.  Imaging Studies: Reviewed  Assessment and Plan:   Devin Hale is a 70 y.o. male with history of chronic HCV status post treatment, compensated cirrhosis, history of hepatocellular carcinoma status post radiofrequency ablation, complete heart block s/p pacemaker implantation, A. fib on Eliquis, history of chronic musculoskeletal pain on opioid analgesics is seen in consultation for symptomatic grade 2-3 hemorrhoids.  Rectal bleeding is clinically insignificant, hemoglobin is normal  Symptomatic hemorrhoids Today, I have discussed with patient regarding various management options including outpatient rubber band ligation, IRC, embolization or surgical hemorrhoidectomy Patient is on Eliquis, therefore I cannot perform rubber band ligation today.  I informed the patient that [facility] is not indicated for grade 2-3 hemorrhoids, patient is interested to pursue intermittent remedies, topical therapies such as Anusol cream for now.  I have discussed in length regarding proper toilet hygiene, Kegel exercises, regulating bowel movements Advised him to try MiraLAX 17 g 1-2 times daily, along with high-fiber diet, adequate intake of water, information provided Patient will reach out to me if the topical therapies fail in order to proceed with rubber band ligation or embolization.  Hemorrhoid embolization is performed by vascular surgery and have to refer him to vascular surgery group in  [place], [place]   Follow up via MyChart   [name], MD

## 2020-09-30 ENCOUNTER — Encounter: Payer: Self-pay | Admitting: Internal Medicine

## 2020-09-30 ENCOUNTER — Other Ambulatory Visit: Payer: Self-pay

## 2020-09-30 ENCOUNTER — Ambulatory Visit (INDEPENDENT_AMBULATORY_CARE_PROVIDER_SITE_OTHER): Payer: 59 | Admitting: Internal Medicine

## 2020-09-30 VITALS — BP 114/76 | HR 66 | Ht 70.5 in | Wt 178.0 lb

## 2020-09-30 DIAGNOSIS — I4891 Unspecified atrial fibrillation: Secondary | ICD-10-CM

## 2020-09-30 DIAGNOSIS — Z950 Presence of cardiac pacemaker: Secondary | ICD-10-CM | POA: Diagnosis not present

## 2020-09-30 DIAGNOSIS — I442 Atrioventricular block, complete: Secondary | ICD-10-CM | POA: Diagnosis not present

## 2020-09-30 NOTE — Progress Notes (Signed)
ELECTROPHYSIOLOGY OFFICE NOTE  Patient ID: Devin Hale, MRN: [id number], DOB/AGE: 01-15-1951 70 y.o. Admit date: (Not on file) Date of Consult: 09/30/2020  Primary Physician: Marguerita Merles, MD Primary Cardiologist:  [facility]*     HPI Devin Hale is a 70 y.o. male presents for pacemaker follow-up.  I saw him prior to his decision to be transferred to [facility]  Presented February 2021 with high-grade heart block.  Temporary pacemaker was placed and the patient was transferred to [facility] where he underwent His bundle pacing [facility]) pacemaker implantation St Jude complicated by atrial lead dislodgment as well as pericarditis for which he received colchicine for a month.  Also developed postprocedural atrial fibrillation and started on Eliquis  Evaluation also demonstrated unipolar pacing and pocket stimulation during backup pulsing.  Programmed to bipolar pacing  Sarcoid suggested* cMRI; when I reached out to [facility] about PET scanning they were declining.  Last visit raised the possibility about referral to Dr. Burt Knack down at [facility] for adjudication   Muscle biopsy pending for progressive weakness.  DOE and fatigue which is very limiting and now he describes himself about 5% of where he was 2 years ago; no associated chest discomfort.    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  Past Medical History:  Diagnosis Date  . Complete heart block ([place])   . Hepatitis   . Hepatitis C   . Pacemaker        Surgical History:  Past Surgical History:  Procedure Laterality Date  . COLONOSCOPY WITH PROPOFOL N/A 05/27/2020   Procedure: COLONOSCOPY WITH PROPOFOL;  Surgeon: Lucilla Lame, MD;  Location: [facility] ENDOSCOPY;  Service: Endoscopy;  Laterality: N/A;  . ESOPHAGOGASTRODUODENOSCOPY (EGD) WITH PROPOFOL N/A 05/27/2020   Procedure: ESOPHAGOGASTRODUODENOSCOPY (EGD) WITH PROPOFOL;  Surgeon: Lucilla Lame, MD;  Location: [facility] ENDOSCOPY;  Service: Endoscopy;  Laterality: N/A;  . KNEE ARTHROSCOPY Left 1982  . LIVER BIOPSY    . NASAL SINUS SURGERY  2019  . TEMPORARY PACEMAKER N/A 08/13/2019   Procedure: TEMPORARY PACEMAKER;  Surgeon: [name], MD;  Location: [facility] CV LAB;  Service: Cardiovascular;  Laterality: N/A;  . TONSILLECTOMY       Home Meds: Current Meds  Medication Sig  . alendronate (FOSAMAX) 70 MG tablet Take 70 mg by mouth once a week.  [name] ascorbic acid (VITAMIN C) 500 MG tablet Take 500 mg by mouth 2 (two) times daily.   [name] atorvastatin (LIPITOR) 40 MG tablet Take 1 tablet by mouth once daily  . cholecalciferol (VITAMIN D3) 25 MCG (1000 UNIT) tablet Take 1,000 Units by mouth in the morning and at bedtime.   . Coenzyme Q10 (CO Q 10) 100 MG CAPS Take 100 mg by mouth daily.  . diclofenac Sodium (VOLTAREN) 1 % GEL Apply topically.  . Docosahexaenoic Acid (DHA COMPLETE PO) Take 500 mg by mouth daily.  [name] ELIQUIS 5 MG TABS tablet Take 5 mg by mouth 2 (two) times daily.  . hydrocortisone (ANUSOL-HC)  2.5 % rectal cream Place 1 application rectally 2 (two) times daily.  . magnesium oxide (MAG-OX) 400 MG tablet Take 200 mg by mouth 2 (two) times daily.  . Multiple Vitamin (MULTIVITAMIN WITH MINERALS) TABS tablet Take 1 tablet by mouth daily.  . Omega-3 Fatty Acids (FISH OIL) 1000 MG CAPS Take by mouth daily.  . vitamin E 200 UNIT capsule Take 200 Units by mouth daily.  . Vitamins-Lipotropics (COMPLEX B-100-INOSITOL) TBCR Take by mouth.  . Whey Protein POWD Take by mouth daily.     Allergies: No Known Allergies      ROS:  Please see the history of present illness.     All other systems reviewed and negative.    Physical Exam: Blood pressure 114/76, pulse 66, height 5' 10.5" (1.791 m), weight 178 lb (80.7 kg). Well developed and well nourished in no acute distress HENT normal Neck supple with JVP-flat Clear Device pocket well healed; without hematoma or erythema.  There is no tethering  Regular rate and rhythm, no   gallop No murmur Abd-soft with active BS No Clubbing cyanosis 2+ right and 1+ left-sided edema edema Skin-warm and dry A & Oriented  Grossly normal sensory and motor function  ECG sinus with P synchronous pacing with a negative QRS lead V1 upright QRS lead I and a QRS duration of 172 ms    Labs: Cardiac Enzymes No results for input(s): CKTOTAL, CKMB, TROPONINI in the last 72 hours. CBC Lab Results  Component Value Date   WBC 7.0 04/07/2020   HGB 14.4 04/07/2020   HCT 41.7 04/07/2020   MCV 85.6 04/07/2020   PLT 104 (L) 04/07/2020   PROTIME: No results for input(s): LABPROT, INR in the last 72 hours. Chemistry No results for input(s): NA, K, CL, CO2, BUN, CREATININE, CALCIUM, PROT, BILITOT, ALKPHOS, ALT, AST, GLUCOSE in the last 168 hours.  Invalid input(s): LABALBU Lipids No results found for: CHOL, HDL, LDLCALC, TRIG BNP No results found for: PROBNP Thyroid Function Tests: No results for input(s): TSH, T4TOTAL, T3FREE, THYROIDAB in the last 72 hours.  Invalid input(s): FREET3 Miscellaneous No results found for: DDIMER  Radiology/Studies:  CUP PACEART REMOTE DEVICE CHECK  Result Date: 09/05/2020 Scheduled remote reviewed. Normal device function.  Next remote 91 days. HB   EKG: Sinus with fusion   Assessment and Plan:  Complete heart block-intermittent  Pacemaker-Saint Jude with a [employer] His lead  Pericarditis post atrial lead microperforation/dislodgment  Atrial fibrillation  postprocedure  Hepatitis C  Neuromuscular issue currently undiagnosed  Asymmetric edema     Muscle biopsy is pending.  Much I think changes on this, specifically, however this inform both the timing and the direction of a further work-up for his dyspnea on exertion  We remain with the question as to the cause of his complete heart block.  We have discussed the possibility of referral to [facility] to see Dr. Burt Knack to consider cardiac sarcoid.  With the abnormal MRI we could either do PET scanning here but might be better adjudicated by having him see Dr. Burt Knack who can do the PET scan at [facility].  Patient is amenable to that referral    No interval atrial fibrillation of which he is aware; none detected on his device.  Continue anticoagulation.  I think it would be reasonable to consider discontinuing his anticoagulation using his device as a monitor given the postprocedural nature of the presenting arrhythmia      Virl Axe

## 2020-09-30 NOTE — Patient Instructions (Addendum)
Medication Instructions:  - Your physician recommends that you continue on your current medications as directed. Please refer to the Current Medication list given to you today.  *If you need a refill on your cardiac medications before your next appointment, please call your pharmacy*   Lab Work: - none ordered  If you have labs (blood work) drawn today and your tests are completely normal, you will receive your results only by: [name] MyChart Message (if you have MyChart) OR . A paper copy in the mail If you have any lab test that is abnormal or we need to change your treatment, we will call you to review the results.   Testing/Procedures: - none ordered   Follow-Up: At [facility], you and your health needs are our priority.  As part of our continuing mission to provide you with exceptional heart care, we have created designated Provider Care Teams.  These Care Teams include your primary Cardiologist (physician) and Advanced Practice Providers (APPs -  Physician Assistants and Nurse Practitioners) who all work together to provide you with the care you need, when you need it.  We recommend signing up for the patient portal called "MyChart".  Sign up information is provided on this After Visit Summary.  MyChart is used to connect with patients for Virtual Visits (Telemedicine).  Patients are able to view lab/test results, encounter notes, upcoming appointments, etc.  Non-urgent messages can be sent to your provider as well.   To learn more about what you can do with MyChart, go to [email or web address].    Your next appointment:   6 month(s)  The format for your next appointment:   In Person  Provider:   Virl Axe, MD   Other Instructions - Dr. Caryl Comes will review your muscle biopsy results and then we can determine if we will be moving forward with the referral to the [facility]

## 2020-10-02 ENCOUNTER — Ambulatory Visit: Payer: 59 | Admitting: Gastroenterology

## 2020-10-06 ENCOUNTER — Telehealth: Payer: Self-pay

## 2020-10-06 DIAGNOSIS — K642 Third degree hemorrhoids: Secondary | ICD-10-CM

## 2020-10-06 NOTE — Telephone Encounter (Signed)
Called patient and left a detail message for patient

## 2020-10-06 NOTE — Telephone Encounter (Signed)
Patient left a second phone call regarding a referral that Dr. Marius Ditch was sending over to the vascular office in [place]. Please call patient to confirm.

## 2020-10-06 NOTE — Telephone Encounter (Signed)
Patient left vmail. Has questions for the clinical staff. I returned the call this morning, no answer. Left voicemail for patient to return the call.

## 2020-10-06 NOTE — Telephone Encounter (Signed)
I have not done referral what is the name of the office and provider

## 2020-10-06 NOTE — Telephone Encounter (Signed)
Per [place] faxed referral to [facility]. Have placed referral and fax it to them

## 2020-10-09 ENCOUNTER — Telehealth: Payer: Self-pay | Admitting: Internal Medicine

## 2020-10-09 NOTE — Telephone Encounter (Signed)
Will route to our Pharmacist to advise

## 2020-10-09 NOTE — Telephone Encounter (Signed)
Patient made aware of Raquel G., PharmDs response. Patient verbalized understanding and voiced appreciation for the call.

## 2020-10-09 NOTE — Telephone Encounter (Signed)
Okay to take as prescribed to control post-surgical pain. Use only as needed. No significant interaction with current medication expected.

## 2020-10-09 NOTE — Telephone Encounter (Signed)
Patient states he had surgery yesterday and would like to discuss his medication- Tramadol. He would like to know if this will affect  His heart medication. Please call to discuss.

## 2020-10-20 ENCOUNTER — Telehealth: Payer: Self-pay | Admitting: Gastroenterology

## 2020-10-20 NOTE — Telephone Encounter (Signed)
Patient called Devin Hale asking for office manager to call him back regarding some information that he requested and has not received.

## 2020-10-21 ENCOUNTER — Telehealth: Payer: Self-pay | Admitting: Gastroenterology

## 2020-10-21 DIAGNOSIS — M810 Age-related osteoporosis without current pathological fracture: Principal | ICD-10-CM

## 2020-10-21 NOTE — Telephone Encounter (Signed)
Patient called second time this morning asking for call back from Environmental education officer.  I assured patient that a note has been put up and that his call will be returned asap.

## 2020-10-21 NOTE — Telephone Encounter (Signed)
Returned patient call. Patient wanted to know the name of the practice he was referred to for hemorrhoid removal. Informed patient it was [facility]. Patient reports he wanted to do research on office before he makes an appointment at an office that far away. Also informed patient that he could try [facility] since his insurance does not require a referral. Patient verbalized understanding.

## 2020-10-21 NOTE — Telephone Encounter (Signed)
RN/Practice Admin attempted to contact patient. Phone went straight to VM. LVM for patient to return call

## 2020-10-21 NOTE — Telephone Encounter (Signed)
Patient called back, says he did not recognize the number you called from, so he did not answer.  Patient asks for call back.

## 2020-10-29 ENCOUNTER — Other Ambulatory Visit: Payer: Self-pay | Admitting: Family Medicine

## 2020-10-29 DIAGNOSIS — M810 Age-related osteoporosis without current pathological fracture: Secondary | ICD-10-CM

## 2020-11-04 ENCOUNTER — Telehealth: Payer: Self-pay | Admitting: Internal Medicine

## 2020-11-04 MED ORDER — ELIQUIS 5 MG PO TABS: 5.0000 mg | ORAL_TABLET | Freq: Two times a day (BID) | ORAL | 6 refills | Status: DC

## 2020-11-04 NOTE — Telephone Encounter (Signed)
Please review for refill, Thanks !  

## 2020-11-04 NOTE — Telephone Encounter (Signed)
Prescription refill request for Eliquis received. Indication: Atrial fib Last office visit: 09/30/20 Scr: 1.0 on 10/13/20 Age: 70 Weight: 80.7kg  Based on above findings Eliquis 5mg  twice daily is the appropriate dose.  Refill approved.

## 2020-11-04 NOTE — Telephone Encounter (Signed)
*  STAT* If patient is at the pharmacy, call can be transferred to refill team.   1. Which medications need to be refilled? (please list name of each medication and dose if known) Eliquis 5 mg po BID   2. Which pharmacy/location (including street and city if local pharmacy) is medication to be sent to? [facility] [place]   3. Do they need a 30 day or 90 day supply? 30   PATIENT AT PHARMACY NOW

## 2020-11-11 ENCOUNTER — Other Ambulatory Visit: Payer: Self-pay

## 2020-11-11 ENCOUNTER — Ambulatory Visit
Admission: RE | Admit: 2020-11-11 | Discharge: 2020-11-11 | Disposition: A | Discharge status: Home / Self Care | Payer: 59 | Source: Ambulatory Visit | Attending: Family Medicine | Admitting: Family Medicine

## 2020-11-11 DIAGNOSIS — M810 Age-related osteoporosis without current pathological fracture: Secondary | ICD-10-CM

## 2020-11-19 ENCOUNTER — Telehealth: Payer: Self-pay | Admitting: Internal Medicine

## 2020-11-19 NOTE — Telephone Encounter (Signed)
Error

## 2020-11-19 NOTE — Telephone Encounter (Signed)
Attempted to call the patient. No answer- I left a message to please call back.  

## 2020-11-19 NOTE — Telephone Encounter (Signed)
Patient calling to discuss MRI procedure questions  Please call

## 2020-11-19 NOTE — Telephone Encounter (Signed)
I spoke with the patient. He had questions about if he needed to reach out to [place] now that he is on a every 3 month MRI cycle for his liver. He wanted to know how much in advance we needed to be aware of this in regards to his implanted device.  I advised the patient that once the ordering provider schedules this for him, then the MRI department at [facility] should reach out to our device clinic team to request any necessary clearances.   The patient voices understanding.  I also inquired if he had heard anything back on his muscle biopsy and he advised he was told initially it would be 4 weeks, then he found out last week there was an issue with the original biopsy and it would now be an additional 4 weeks.  He will keep me updated when the results come back.

## 2020-11-19 NOTE — Telephone Encounter (Signed)
Patient is returning your call.  

## 2020-11-20 DIAGNOSIS — K7460 Unspecified cirrhosis of liver: Principal | ICD-10-CM

## 2020-11-20 DIAGNOSIS — M810 Age-related osteoporosis without current pathological fracture: Principal | ICD-10-CM

## 2020-11-20 DIAGNOSIS — Z800 Family history of malignant neoplasm of digestive organs: Principal | ICD-10-CM

## 2020-11-25 NOTE — Telephone Encounter (Signed)
Patient calling with below information :  Muscle biopsy was inconclusive  Booklet was received  Patient asking for call back with kleins POC .

## 2020-11-25 NOTE — Telephone Encounter (Signed)
Attempted to call the patient. No answer- I left a message stating I will review the information he left regarding the muscle biopsy, with Dr. Caryl Comes next week when he returns to the office.  I advised he does not need to call me back unless he has further questions, but I did want to let him know a time frame of when to expect a call back.

## 2020-12-05 ENCOUNTER — Ambulatory Visit (INDEPENDENT_AMBULATORY_CARE_PROVIDER_SITE_OTHER): Payer: 59

## 2020-12-05 DIAGNOSIS — I442 Atrioventricular block, complete: Secondary | ICD-10-CM | POA: Diagnosis not present

## 2020-12-05 DIAGNOSIS — I4891 Unspecified atrial fibrillation: Secondary | ICD-10-CM

## 2020-12-05 LAB — CUP PACEART REMOTE DEVICE CHECK
Battery Remaining Longevity: 98 mo
Battery Remaining Percentage: 95.5 %
Battery Voltage: 2.99 V
Brady Statistic AP VP Percent: 9.4 %
Brady Statistic AP VS Percent: 1 %
Brady Statistic AS VP Percent: 91 %
Brady Statistic AS VS Percent: 1 %
Brady Statistic RA Percent Paced: 9.3 %
Brady Statistic RV Percent Paced: 99 %
Date Time Interrogation Session: 20220617020016
Implantable Lead Implant Date: 20210224
Implantable Lead Implant Date: 20210224
Implantable Lead Location: 753859
Implantable Lead Location: 753860
Implantable Lead Model: 7842
Implantable Lead Serial Number: 1032895
Implantable Pulse Generator Implant Date: 20210224
Lead Channel Impedance Value: 410 Ohm
Lead Channel Impedance Value: 550 Ohm
Lead Channel Pacing Threshold Amplitude: 1 V
Lead Channel Pacing Threshold Amplitude: 1.375 V
Lead Channel Pacing Threshold Pulse Width: 1 ms
Lead Channel Pacing Threshold Pulse Width: 1 ms
Lead Channel Sensing Intrinsic Amplitude: 10.5 mV
Lead Channel Sensing Intrinsic Amplitude: 2.3 mV
Lead Channel Setting Pacing Amplitude: 1.625
Lead Channel Setting Pacing Amplitude: 2.5 V
Lead Channel Setting Pacing Pulse Width: 1 ms
Lead Channel Setting Sensing Sensitivity: 2 mV
Pulse Gen Model: 2272
Pulse Gen Serial Number: 9197143

## 2020-12-10 ENCOUNTER — Telehealth: Payer: Self-pay | Admitting: Internal Medicine

## 2020-12-10 NOTE — Telephone Encounter (Signed)
Patient returning call for update info

## 2020-12-10 NOTE — Telephone Encounter (Signed)
Made in error

## 2020-12-11 NOTE — Telephone Encounter (Signed)
I have reviewed with Dr. Caryl Comes that the patient's biopsy results were inconclusive from [facility]. Per Dr. Caryl Comes, he feels the patient should proceed with a referral to the [facility] in [place], [place] to see Dr. Sampson Si (Sarcoid specialist).  I attempted to call the patient back. No answer- I left a message to please call back.    Referral information received from Dr. Magda Bernheim RN: Devin Hale. [facility]  Patient Appointment Coordinator  Cardiology Appointment Services   [phone]  Fax- [phone]  Email - [email or web address]

## 2020-12-11 NOTE — Telephone Encounter (Signed)
The patient called back. He advised he received my message, but wanted to update me on his current health status.  The patient confirms: - his liver cancer is back - he is applying to be part of an NIH research study "Undiagnosed Diseases Network" but he is not confirmed as part of this yet - he was supposed to have an MRI for his liver cancer yesterday, but this has been rescheduled to next week on 12/16/20 - he has an appointment with his oncologist on 12/30/20 - he has an appointment with an geneticist on 01/08/21  The patient had various questions regarding in trying to think through his course of care. He advised he is not ruling out an appointment with Dr. Burt Knack at the [facility], but was wanting to discuss his current state with Dr. Caryl Comes. I have advised him that a virtual visit with Dr. Caryl Comes is certainly appropriate at this time.  We have discussed whether he wanted to have this discussion as soon as next week, or wait until all of the above appointments have been had.  The patient is in favor of having a virtual visit after all the above appointments have been completed. I have offered him 01/27/21 at 8:40 am for a virtual appointment and he is agreeable.  The patient was appreciative for the call back and discussion.

## 2020-12-19 ENCOUNTER — Encounter: Admit: 2020-12-19 | Discharge: 2020-12-20 | Payer: MEDICARE

## 2020-12-19 DIAGNOSIS — C220 Liver cell carcinoma: Principal | ICD-10-CM

## 2020-12-25 NOTE — Progress Notes (Signed)
Remote pacemaker transmission.   

## 2020-12-30 ENCOUNTER — Encounter
Admit: 2020-12-30 | Discharge: 2021-01-18 | Payer: MEDICARE | Attending: Radiation Oncology | Primary: Radiation Oncology

## 2021-01-08 DIAGNOSIS — C220 Liver cell carcinoma: Principal | ICD-10-CM

## 2021-01-22 ENCOUNTER — Encounter: Admit: 2021-01-22 | Discharge: 2021-01-23 | Payer: MEDICARE

## 2021-01-22 ENCOUNTER — Ambulatory Visit: Admit: 2021-01-22 | Discharge: 2021-01-23 | Payer: MEDICARE

## 2021-01-22 DIAGNOSIS — M25441 Effusion, right hand: Principal | ICD-10-CM

## 2021-01-22 DIAGNOSIS — I442 Atrioventricular block, complete: Principal | ICD-10-CM

## 2021-01-22 DIAGNOSIS — M1811 Unilateral primary osteoarthritis of first carpometacarpal joint, right hand: Principal | ICD-10-CM

## 2021-01-22 DIAGNOSIS — R531 Weakness: Principal | ICD-10-CM

## 2021-01-27 ENCOUNTER — Encounter: Payer: Self-pay | Admitting: Internal Medicine

## 2021-01-27 ENCOUNTER — Other Ambulatory Visit: Payer: Self-pay

## 2021-01-27 ENCOUNTER — Telehealth (INDEPENDENT_AMBULATORY_CARE_PROVIDER_SITE_OTHER): Payer: 59 | Admitting: Internal Medicine

## 2021-01-27 VITALS — Ht 70.5 in | Wt 178.0 lb

## 2021-01-27 DIAGNOSIS — Z950 Presence of cardiac pacemaker: Secondary | ICD-10-CM | POA: Diagnosis not present

## 2021-01-27 DIAGNOSIS — I442 Atrioventricular block, complete: Secondary | ICD-10-CM

## 2021-01-27 NOTE — Patient Instructions (Addendum)
Medication Instructions:  - Your physician recommends that you continue on your current medications as directed. Please refer to the Current Medication list given to you today.  *If you need a refill on your cardiac medications before your next appointment, please call your pharmacy*   Lab Work: - none ordered  If you have labs (blood work) drawn today and your tests are completely normal, you will receive your results only by: [place] (if you have MyChart) OR A paper copy in the mail If you have any lab test that is abnormal or we need to change your treatment, we will call you to review the results.   Testing/Procedures: - Your physician has recommended that you have a Myocardial PET Sarcoid Study at [facility]:  The process for scheduling this testing includes: 1) we have to obtain an authorization from your insurance 2) an order is then faxed to [facility] for the test 3) they will call us with the appointment date/ time 4) we will then call you with the appointment date/ time 5) the [facility] PET imaging department will call you about 1-3 days ahead of your scheduled test with any pre-test instructions and the location of the test  Note: Per the [place] PET department, scheduling of these test are running about 6-8 weeks out.    Follow-Up: At [facility], you and your health needs are our priority.  As part of our continuing mission to provide you with exceptional heart care, we have created designated Provider Care Teams.  These Care Teams include your primary Cardiologist (physician) and Advanced Practice Providers (APPs -  Physician Assistants and Nurse Practitioners) who all work together to provide you with the care you need, when you need it.  We recommend signing up for the patient portal called "MyChart".  Sign up information is provided on this After Visit Summary.  MyChart is used to connect with patients for Virtual Visits (Telemedicine).  Patients are able to view lab/test  results, encounter notes, upcoming appointments, etc.  Non-urgent messages can be sent to your provider as well.   To learn more about what you can do with MyChart, go to [email or web address].    Your next appointment:   2.5  month(s)  The format for your next appointment:   Virtual Visit   Provider:   Virl Axe, MD   Other Instructions N/a

## 2021-01-27 NOTE — Progress Notes (Signed)
Patient ID: Devin Hale, male   DOB: Aug 19, 1950, 70 y.o.   MRN: [id number]     Electrophysiology TeleHealth Note   Due to national recommendations of social distancing due to COVID 19, an audio/video telehealth visit is felt to be most appropriate for this patient at this time.  See MyChart message from today for the patient's consent to telehealth for [name].   Date:  01/27/2021   ID:  Devin Hale, DOB [birth year], MRN [id number]  Location: patient's home  Provider location: [address] [place]  Evaluation Performed: Follow-up visit  PCP:  Marguerita Merles, MD  Cardiologist:     Electrophysiologist:  SK     History of Present Illness:    Devin Hale is a 70 y.o. male who presents via audio/video conferencing for a telehealth visit today.  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  I discussed referral to [facility]; notes report that he was amenable to that but I do not see that it was consummated  Genetics evaluation reviewed, (very thorough summary) noting the muscle biopsy showing "myopathic features with increased MAC-1. 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  Genetic causes of myositis were recommended  Treated hepatocellular ca fall 2021; recurrent MRI abnormal imaging>> tumor board ? Collateral damage 2/2 ablation.  Repeating US next week   Today, thhe patient denies chest pain,, nocturnal dyspnea, orthopnea or peripheral edema.  There have been no palpitations, lightheadedness or syncope.  Complains of increasing dyspnea .  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  The patient denies symptoms of fevers, chills, cough, or new SOB worrisome for COVID 19.     Past Medical History:  Diagnosis Date   Complete heart block ([place])    Hepatitis    Hepatitis C    Pacemaker     Past Surgical History:  Procedure Laterality Date   COLONOSCOPY WITH PROPOFOL N/A 05/27/2020   Procedure: COLONOSCOPY WITH PROPOFOL;  Surgeon: Lucilla Lame, MD;  Location: [facility] ENDOSCOPY;  Service: Endoscopy;  Laterality: N/A;   ESOPHAGOGASTRODUODENOSCOPY (EGD) WITH PROPOFOL N/A 05/27/2020   Procedure: ESOPHAGOGASTRODUODENOSCOPY (EGD) WITH PROPOFOL;  Surgeon: Lucilla Lame, MD;  Location: [facility] ENDOSCOPY;  Service: Endoscopy;  Laterality: N/A;   KNEE ARTHROSCOPY Left 1982   LIVER BIOPSY     NASAL SINUS SURGERY  2019   TEMPORARY PACEMAKER N/A 08/13/2019   Procedure: TEMPORARY PACEMAKER;  Surgeon: [name], MD;  Location: [place] CV LAB;  Service: Cardiovascular;  Laterality: N/A;   TONSILLECTOMY      Current Outpatient Medications  Medication Sig Dispense Refill   alendronate (FOSAMAX) 70 MG tablet Take 70 mg by mouth once a week.     ascorbic acid (VITAMIN C) 500 MG tablet Take 500 mg by mouth 2 (two) times daily.      atorvastatin (LIPITOR) 40 MG tablet Take 1 tablet by mouth once daily 90 tablet 3   cholecalciferol (VITAMIN D3) 25 MCG (1000 UNIT) tablet Take 1,000 Units by mouth in the morning  and at bedtime.      Coenzyme Q10 (CO Q 10) 100 MG CAPS Take 100 mg by mouth daily.     diclofenac Sodium (VOLTAREN) 1 % GEL Apply topically.     Docosahexaenoic Acid (DHA COMPLETE PO) Take 500 mg by mouth daily.     ELIQUIS 5 MG TABS tablet Take 1 tablet (5 mg total) by mouth 2 (two) times daily. 60 tablet 6   hydrocortisone (ANUSOL-HC) 2.5 % rectal cream Place 1 application rectally 2 (two) times daily. 30 g 0   magnesium oxide (MAG-OX) 400 MG tablet Take 200 mg by mouth 2 (two) times daily.     Multiple Vitamin (MULTIVITAMIN WITH MINERALS) TABS tablet Take 1 tablet by mouth daily.     Omega-3 Fatty Acids (FISH OIL) 1000 MG CAPS Take by mouth daily.     vitamin E 200 UNIT capsule Take 200 Units by mouth daily.     Vitamins-Lipotropics (COMPLEX B-100-INOSITOL) TBCR Take by mouth.     Whey Protein POWD Take by mouth daily.     No current facility-administered medications for this visit.    Allergies:   Patient has no known allergies.   Social History:  The patient  reports that he has never smoked. He has never used smokeless tobacco. He reports that he does not drink alcohol and does not use drugs.   Family History:  The patient's   family history includes Heart attack in his father; Non-Hodgkin's lymphoma in his mother.   ROS:  Please see the history of present illness.   All other systems are personally reviewed and negative.    Exam:    Vital Signs:  Ht 5' 10.5" (1.791 m)   Wt 178 lb (80.7 kg)   BMI 25.18 kg/m      Labs/Other Tests and Data Reviewed:    Recent Labs: 04/07/2020: ALT 136; BUN 15; Creatinine, Ser 0.99; Hemoglobin 14.4; Platelets 104; Potassium 4.0; Sodium 132   Wt Readings from Last 3 Encounters:  01/27/21 178 lb (80.7 kg)  09/30/20 178 lb (80.7 kg)  09/25/20 177 lb 2 oz (80.3 kg)     Other studies personally reviewed:    Device interrogation 6/22  Afib on I been going through your bunch of notes on you lots been going on  ASSESSMENT & PLAN:     Complete heart block-intermittent   Pacemaker-[employer] with a [employer] His lead   Pericarditis post atrial lead microperforation/dislodgment   Atrial fibrillation postprocedure   Hepatitis C ? Hepatocellular carcinoma    Neuromuscular issue currently  >>  biopsy inconclusive for inclusion body myositis    Asymmetric edema   No interval atrial fibrillation  Lengthy review regarding interval evaluations  Will proceed with PET scan for cardiac sarcoid-- depending on results consider referral to [place] 19 screen The patient denies symptoms of COVID 19 at this time.  The importance of social distancing was discussed today.  Follow-up:  2 months telehealth visit      Current medicines are reviewed at length with the patient today.   The patient  concerns regarding his medicines.  The following changes were made today:   Labs/ tests ordered today include: Cardiac PET  No orders of the defined types were placed in this encounter.   Future tests ( post COVID )   months  Patient Risk:  after full review of this patients clinical status, I feel that they are at moderate risk at this time.  Today, I have spent 33  minutes with the patient with telehealth technology discussing the above and reviewing the old records

## 2021-01-29 ENCOUNTER — Telehealth: Payer: Self-pay | Admitting: Internal Medicine

## 2021-01-29 ENCOUNTER — Other Ambulatory Visit: Payer: Self-pay

## 2021-01-29 ENCOUNTER — Encounter: Payer: Self-pay | Admitting: Pulmonary Disease

## 2021-01-29 ENCOUNTER — Ambulatory Visit (INDEPENDENT_AMBULATORY_CARE_PROVIDER_SITE_OTHER): Payer: 59 | Admitting: Pulmonary Disease

## 2021-01-29 VITALS — BP 118/70 | HR 67 | Temp 97.3°F | Ht 68.11 in | Wt 172.0 lb

## 2021-01-29 DIAGNOSIS — R0600 Dyspnea, unspecified: Secondary | ICD-10-CM | POA: Diagnosis not present

## 2021-01-29 DIAGNOSIS — R0609 Other forms of dyspnea: Secondary | ICD-10-CM

## 2021-01-29 NOTE — Telephone Encounter (Signed)
Patient came by office Would like to discuss the [facility] report he received on Monday when [name] returns

## 2021-01-29 NOTE — Progress Notes (Signed)
[place] [facility]  Chief Complaint  Patient presents with   Consult    Patient states that he has shortness of breath with exertion and it has been mentioned that he may have sarcoidosis on his heart supposed to be having PET scan at [facility]. Denies cough    Constitutional:  BP 118/70 (BP Location: Left Arm, Patient Position: Sitting, Cuff Size: Normal)   Pulse 67   Temp (!) 97.3 F (36.3 C) (Oral)   Ht 5' 8.11" (1.73 m)   Wt 172 lb (78 kg)   SpO2 97%   BMI 26.07 kg/m   Past Medical History:  Complete heart block, Hepatitis C with cirrhosis, Positive ANA, Osteoarthritis, Hepatocellular carcinoma, Osteoporosis, Anxiety, Post procedure atrial fibrillation, Cervical spinal stenosis, Chronic pain, Rhinitis, Nasal polyps, Pericarditis post atrial lead microperforation  Past Surgical History:  He  has a past surgical history that includes Knee arthroscopy (Left, 1982); Tonsillectomy; TEMPORARY PACEMAKER (N/A, 08/13/2019); Liver biopsy; Nasal sinus surgery (2019); Colonoscopy with propofol (N/A, 05/27/2020); and Esophagogastroduodenoscopy (egd) with propofol (N/A, 05/27/2020).  Brief Summary:  Devin Hale is a 70 y.o. male with dyspnea.      Subjective:   He is followed by cardiology for heart block.  He had MRI heart in September 2021 and there was concern for possible cardiac sarcoidosis.  He is being set up for a PET scan at [facility].  Chest xray from 12/16/20 showed biapical scarring.  His breathing issues started about 3 or 4 years ago.  He gets winded with minimal activity and with talking.  No history of smoking.  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  He is not having cough, wheeze, fever, sweats, weight loss, skin rash, or gland swelling.  No history of pneumonia or tuberculosis.  He is from the [place] area, but moved to [place] in 2005.  He has trouble getting up out of a chair due to muscle  strength.  Physical Exam:   Appearance - well kempt   ENMT - no sinus tenderness, no oral exudate, no LAN, Mallampati 2 airway, no stridor  Respiratory - equal breath sounds bilaterally, no wheezing or rales  CV - s1s2 regular rate and rhythm, no murmurs  Ext - no clubbing, no edema  Skin - no rashes  Psych - normal mood and affect   Pulmonary testing:    Chest Imaging:  CT angio chest 10/02/19 >> lower lobe consolidation, moderate pericardial effusion, coronary calcification  Cardiac Tests:  Echo 10/02/19 >> EF 72%, mod LVH, small/mod pericardial effusion  Social History:  He  reports that he has never smoked. He has never used smokeless tobacco. He reports that he does not drink alcohol and does not use drugs.  Family History:  His family history includes Heart attack in his father; Non-Hodgkin's lymphoma in his mother.    Discussion:  He has dyspnea on exertion that has been progressive over the past 3 years.  This is associated with neuropathy and myopathy of undetermined cause.  His cardiac MRI from September 2021 showed possible changes of sarcoidosis.   Assessment/Plan:   Dyspnea on exertion. - he is to get PET scan scheduled at [facility]; this should give some indication of whether there is concern for pulmonary sarcoidosis although previous chest imaging didn't show evidence for this - will arrange for pulmonary function testing to include inspiratory and expiratory pressures to assess for respiratory muscle weakness  Complete heart block. - followed by Dr. Virl Axe with [facility] Cardiology  Hepatitis C with cirrhosis. - followed at [facility] Liver program  Peripheral neuropathy/myopathy of undetermined cause. - seen by Dr. Lattie Haw Hobson-Webb with [facility] Neurology, Dr. Marlynn Perking at [facility] rheumatology, and Dr. Nelma Rothman Mare Ferrari with [facility]   Time Spent Involved in Patient Care on Day of Examination:  47 minutes  Follow up:    Patient Instructions  Will arrange for pulmonary function test  Call to let us know if PET scan at [facility] isn't being scheduled, and we will then order a CT scan of your chest  Follow up in 6 weeks with Dr. Halford Chessman or Nurse Practitioner   Medication List:   Allergies as of 01/29/2021   No Known Allergies      Medication List        Accurate as of January 29, 2021 10:36 AM. If you have any questions, ask your nurse or doctor.          alendronate 70 MG tablet Commonly known as: FOSAMAX Take 70 mg by mouth once a week.   ascorbic acid 500 MG tablet Commonly known as: VITAMIN C Take 500 mg by mouth 2 (two) times daily.   atorvastatin 40 MG tablet Commonly known as: LIPITOR Take 1 tablet by mouth once daily   cholecalciferol 25 MCG (1000 UNIT) tablet Commonly known as: VITAMIN D3 Take 1,000 Units by mouth in the morning and at bedtime.   Co Q 10 100 MG Caps Take 100 mg by mouth daily.   Complex B-100-Inositol Tbcr Take by mouth.   DHA COMPLETE PO Take 500 mg by mouth daily.   diclofenac Sodium 1 % Gel Commonly known as: VOLTAREN Apply topically.   Eliquis 5 MG Tabs tablet Generic drug: apixaban Take 1 tablet (5 mg total) by mouth 2 (two) times daily.   Fish Oil 1000 MG Caps Take by mouth daily.   hydrocortisone 2.5 % rectal cream Commonly known as: ANUSOL-HC Place 1 application rectally 2 (two) times daily.   magnesium oxide 400 MG tablet Commonly known as: MAG-OX Take 200 mg by mouth 2 (two) times daily.   multivitamin with minerals Tabs tablet Take 1 tablet by mouth daily.   vitamin E 200 UNIT capsule Take 200 Units by mouth daily.   Whey Protein Powd Take by mouth daily.        Signature:  Chesley Mires, MD [place] Pager - [phone] 01/29/2021, 10:36 AM

## 2021-01-29 NOTE — Patient Instructions (Signed)
Will arrange for pulmonary function test  Call to let us know if PET scan at [facility] isn't being scheduled, and we will then order a CT scan of your chest  Follow up in 6 weeks with Dr. Halford Chessman or Nurse Practitioner

## 2021-01-30 ENCOUNTER — Other Ambulatory Visit: Payer: 59

## 2021-02-02 ENCOUNTER — Telehealth: Payer: Self-pay

## 2021-02-02 ENCOUNTER — Ambulatory Visit: Payer: 59

## 2021-02-02 NOTE — Telephone Encounter (Signed)
I spoke with the patient. He advised that he was given an "overall summary of his health history" by [facility]. This was a 27 page comprehensive report that he wanted to see would be of any benefit to Dr. Caryl Comes.  I advised that I cannot see an "overall summary" in his chart in [place], but I can see individual notes/ testing/ labs broken down. I also advised the only thing I cannot see is his recent genetic testing as this was a send out test to [facility].  I have advised the patient that he can send [place] a copy/ have [facility] send [place] a copy of these records for Dr. Caryl Comes to review and he is agreeable to work on this.  He then also advised that he received a call from [facility] this morning that Dr. Burt Knack from our cardiology clinic had approved some type of testing for him. He wanted to know who Dr. Burt Knack was and how his name became attached to his testing.  I advised I am unclear how this happened as his myocardial PET scan is still pending being sent for authorization as Dr. Caryl Comes has not yet signed off his note. Otherwise, he is aware that I see nothing else that Dr. Antionette Char name is attached to.   I have advised him to please call his insurance and verify what testing was approved. He is aware I will also reach out to our precert department as well to see if I can find out anything else.  I will call him back with any further information.

## 2021-02-02 NOTE — Telephone Encounter (Signed)
Called patient in regaurds to PFT, would like to confirm his pft for 8/25. Pt has already had covid test.

## 2021-02-02 NOTE — Telephone Encounter (Signed)
Staff message as stated below received from [employer] treasurer) : [place], Camelia Eng, Berline Lopes, RN Good morning,   I will call and update the MD on file. Dr. Burt Knack is the MD I that had to choose to do auths for [facility]. It's a complicated mess. He will not need to call for clarification, it was me. I usually change the MD after approval when the patient is referred out of the [facility] system.   Will check the auth and information to see if approved.   Thanks,   Estill Bamberg    I attempted to call the patient regarding the above update. No answer- I left a detailed message of the above information and asked that the patient call back if needed.

## 2021-02-02 NOTE — Telephone Encounter (Signed)
Attempted to call the patient. No answer- I left a message to please call back.  

## 2021-02-02 NOTE — Telephone Encounter (Signed)
I have faxed the list up to Pre-admit and his Covid test will be scheduled for 02/11/21 @ 10:15am they just haven't scheduled it yet and the patient is aware

## 2021-02-05 ENCOUNTER — Telehealth: Payer: Self-pay | Admitting: Internal Medicine

## 2021-02-05 NOTE — Telephone Encounter (Signed)
Patient received approval letter from insurance for ct scan and wants to discuss .    Patient was told reason for test was for sarcoidosis and that is not in the letter .    He wants to make sure before doing the test it was coded correctly ordered correctly and the right test for the r/o sarcoidosis that was discussed.     Please call to discuss

## 2021-02-05 NOTE — Telephone Encounter (Signed)
I spoke with the patient.  He was concerned as he has received a copy of his authorization for his myocardial PET scan and the verbage was concerning to him that the incorrect test was authorized.  I have advised him that I have a copy of his authorization and although confusing the correct CPT code was submitted and there should not be an issue regarding his test. He is aware that the order I submitted to [facility] was for a myocardial PET sarcoid study and we in fact used the CPT code they provided for this.  The patient is aware I have faxed all of his information and orders to [facility] for his scan and I will call him with an appointment once I receive this information.  The patient voices understanding and is agreeable.  Myocardial PET order faxed to [facility] today at [phone]- [phone]. Fax confirmation received.

## 2021-02-06 ENCOUNTER — Telehealth: Payer: Self-pay | Admitting: Pulmonary Disease

## 2021-02-06 DIAGNOSIS — D869 Sarcoidosis, unspecified: Secondary | ICD-10-CM

## 2021-02-06 NOTE — Telephone Encounter (Signed)
A myocardial PET scan will only give images related to heart function and would give sufficient imaging of his lungs.  He will need to be set up for high resolution CT chest to assess for pulmonary sarcoidosis.  Please let me know if he is agreeable, and then order can be placed.

## 2021-02-06 NOTE — Telephone Encounter (Signed)
A myocardial PET scan will only give images related to heart function and would give sufficient imaging of his lungs.  Cardiac PET scan is not enough for what we need to look into.    He will need to be set up for high resolution CT chest to assess for pulmonary sarcoidosis.  Please let me know if he is agreeable, and then order can be placed for high resolution CT chest.

## 2021-02-06 NOTE — Telephone Encounter (Signed)
Noted.   VS please advise if PET will suffice or if patient will need additional CT scan. Thanks :)

## 2021-02-06 NOTE — Telephone Encounter (Signed)
Patient is aware of below message/recommendations and voiced his understanding.  CTHR has been ordered as patient is agreeable.  Nothing further needed at this time.  Routing to Dr. Halford Chessman as an Juluis Rainier.

## 2021-02-06 NOTE — Telephone Encounter (Signed)
Dr. Halford Hale Mr. Devin Hale called me this morning asking if the order that Dr. Caryl Comes put in for PET scan at [facility] would give you the correct information that you will need for his lungs. He wants to do just one scan. He stated he didn't want to do Dr. Olin Pia PET and then have to still do Chest CT for you. Can you verify if the order will give you the results that you need without doing another scan  Response from Dr. Olin Pia office He is aware that the order I submitted to [facility] was for a myocardial PET sarcoid study and we in fact used the CPT code they provided for this.

## 2021-02-10 ENCOUNTER — Telehealth: Payer: Self-pay | Admitting: Internal Medicine

## 2021-02-10 NOTE — Telephone Encounter (Signed)
I called the [facility] PET imaging department this morning to follow up on scheduling of the patient sarcoid study. Per PET imaging staff, the patient is scheduled for his Myocardial PET sarcoid study on Thursday 03/19/21 at 11:45 am. They will reach out to him the day prior to his appointment to go over his prep and location of the test.  Attempted to call the patient. No answer- I left a detailed message of the date/ time of his study and a call back # of [phone] for the imaging department if he needs to reach them directly.  I asked that he call back with any further questions/ concerns.

## 2021-02-11 ENCOUNTER — Ambulatory Visit: Admit: 2021-02-11 | Discharge: 2021-02-12 | Payer: MEDICARE

## 2021-02-11 ENCOUNTER — Other Ambulatory Visit
Admission: RE | Admit: 2021-02-11 | Discharge: 2021-02-11 | Disposition: A | Discharge status: Home / Self Care | Payer: 59 | Source: Ambulatory Visit | Attending: Pulmonary Disease | Admitting: Pulmonary Disease

## 2021-02-11 ENCOUNTER — Other Ambulatory Visit: Payer: Self-pay

## 2021-02-11 DIAGNOSIS — Z01812 Encounter for preprocedural laboratory examination: Secondary | ICD-10-CM | POA: Diagnosis present

## 2021-02-11 DIAGNOSIS — Z20822 Contact with and (suspected) exposure to covid-19: Secondary | ICD-10-CM | POA: Insufficient documentation

## 2021-02-12 ENCOUNTER — Ambulatory Visit: Payer: 59 | Attending: Pulmonary Disease

## 2021-02-12 DIAGNOSIS — R0600 Dyspnea, unspecified: Secondary | ICD-10-CM | POA: Diagnosis present

## 2021-02-12 DIAGNOSIS — R0609 Other forms of dyspnea: Secondary | ICD-10-CM

## 2021-02-12 LAB — PULMONARY FUNCTION TEST ARMC ONLY
DL/VA % pred: 76 %
DL/VA: 3.14 ml/min/mmHg/L
DLCO unc % pred: 73 %
DLCO unc: 18.11 ml/min/mmHg
FEF 25-75 Pre: 1.87 L/sec
FEF2575-%Pred-Pre: 78 %
FEV1-%Pred-Pre: 92 %
FEV1-Pre: 2.84 L
FEV1FVC-%Pred-Pre: 97 %
FEV6-%Pred-Pre: 97 %
FEV6-Pre: 3.85 L
FEV6FVC-%Pred-Pre: 104 %
FVC-%Pred-Pre: 93 %
FVC-Pre: 3.91 L
Pre FEV1/FVC ratio: 72 %
Pre FEV6/FVC Ratio: 98 %
RV % pred: 66 %
RV: 1.52 L
TLC % pred: 79 %
TLC: 5.24 L

## 2021-02-12 LAB — SARS CORONAVIRUS 2 (TAT 6-24 HRS): SARS Coronavirus 2: NEGATIVE

## 2021-02-16 ENCOUNTER — Ambulatory Visit: Admit: 2021-02-16 | Discharge: 2021-02-17 | Payer: MEDICARE

## 2021-02-16 DIAGNOSIS — Z1289 Encounter for screening for malignant neoplasm of other sites: Principal | ICD-10-CM

## 2021-02-16 DIAGNOSIS — C220 Liver cell carcinoma: Principal | ICD-10-CM

## 2021-02-16 DIAGNOSIS — K7460 Unspecified cirrhosis of liver: Principal | ICD-10-CM

## 2021-02-16 DIAGNOSIS — B182 Chronic viral hepatitis C: Principal | ICD-10-CM

## 2021-03-04 ENCOUNTER — Ambulatory Visit: Admit: 2021-03-04 | Discharge: 2021-03-05 | Payer: MEDICARE

## 2021-03-04 DIAGNOSIS — K649 Unspecified hemorrhoids: Principal | ICD-10-CM

## 2021-03-05 ENCOUNTER — Telehealth: Payer: Self-pay | Admitting: Internal Medicine

## 2021-03-05 ENCOUNTER — Telehealth: Payer: Self-pay | Admitting: Pulmonary Disease

## 2021-03-05 NOTE — Telephone Encounter (Signed)
Lm for patient.  

## 2021-03-05 NOTE — Telephone Encounter (Signed)
Patient aware.

## 2021-03-05 NOTE — Telephone Encounter (Signed)
Patient is requesting results of PFT.  Dr. Halford Chessman, please advise. Thanks

## 2021-03-05 NOTE — Telephone Encounter (Signed)
Called and spoke with patient, advised him of the results/recommendations per Dr. Halford Chessman.  He wanted to know why there was such a delay in getting the results of the PFT that he had done on 8/25, he got the results on 9/15.  He was upset because he feels like if he needed to have a CT scan done based on the results, he could have already had it done.  I advised him that the PFTs are done by the hospital staff and that I do not know what the turn around time is of getting the results to the provider that ordered them.  I advised him that I would check with Lesleigh Noe in the [place] office and we will follow up on the delay.  I apologized for the delay and assured him we would follow up.  I told him that one of our PCCs would call to schedule the HR CT scan for him.  He requested that it be done at [facility].  Advised I would make the [facility] aware.  Per Lesleigh Noe [facility] office), the results should go directly to the provider.  Dr. Halford Chessman, The patient was not happy that his PFT was done on 8/25 and he got his results today that requires him to have a HR CT scan.  He wants to know why there was such a delay.  Please advise.  Thank you.

## 2021-03-05 NOTE — Telephone Encounter (Signed)
Spoke to patient and relayed below message.  CTHR is scheduled for 03/30/2021. Patient stated that he is scheduled for PET scan 03/23/2021. Dr. Caryl Comes ordered PET.  He is questioning if PET will show the same thing as the [facility].   Dr. Halford Chessman, please advise. Thanks

## 2021-03-05 NOTE — Telephone Encounter (Signed)
   [facility] Pre-operative Risk Assessment    Patient Name: Devin Hale  DOB: 09-12-1950 MRN: [id number]  HEARTCARE STAFF:  - IMPORTANT!!!!!! Under Visit Info/Reason for Call, type in Other and utilize the format Clearance MM/DD/YY or Clearance TBD. Do not use dashes or single digits. - Please review there is not already an duplicate clearance open for this procedure. - If request is for dental extraction, please clarify the # of teeth to be extracted. - If the patient is currently at the dentist's office, call Pre-Op Callback Staff (MA/nurse) to input urgent request.  - If the patient is not currently in the dentist office, please route to the Pre-Op pool.  Request for surgical clearance:  What type of surgery is being performed? FLEX SIG/IRC When is this surgery scheduled? TBD  What type of clearance is required (medical clearance vs. Pharmacy clearance to hold med vs. Both)? BOTH  Are there any medications that need to be held prior to surgery and how long? [place] name and name of physician performing surgery? [facility]   What is the office phone number? [phone]   7.   What is the office fax number? [phone]  8.   Anesthesia type (None, local, MAC, general) ? NOT LISTES   Eli Phillips 03/05/2021, 1:50 PM  _________________________________________________________________   (provider comments below)

## 2021-03-05 NOTE — Telephone Encounter (Signed)
Cardiac PET scan will not show same thing as HRCT chest.  Do we know why his HRCT was scheduled in October and not sooner?  I would need to have HRCT chest done before he sees me.  If this can't be rescheduled for sooner date, then he needs to reschedule his ROV.  Preference would be to get HRCT chest done before his appointment on 03/12/21.

## 2021-03-05 NOTE — Telephone Encounter (Signed)
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  He needs to complete high resolution CT chest that has been ordered to further assess.

## 2021-03-05 NOTE — Telephone Encounter (Signed)
Patient calling to discuss clearance for upcoming gi procedure and states he has been put in the middle between providers and we should contact Dr. Soledad Gerlach office for information.     Attempted to contact to obtain details from [facility] .  No ans lmov for [facility] at [phone].  Will await call back.

## 2021-03-05 NOTE — Telephone Encounter (Signed)
There is a phone message from 02/06/21 discussing his cardiac PET scan not being sufficient for lung imaging and was advised to have HRCT chest ordered then - before his PFT was even done.  Please check the status of his HRCT chest order.

## 2021-03-06 ENCOUNTER — Telehealth: Payer: Self-pay | Admitting: Internal Medicine

## 2021-03-06 ENCOUNTER — Ambulatory Visit (INDEPENDENT_AMBULATORY_CARE_PROVIDER_SITE_OTHER): Payer: 59

## 2021-03-06 DIAGNOSIS — I442 Atrioventricular block, complete: Secondary | ICD-10-CM

## 2021-03-06 NOTE — Telephone Encounter (Signed)
   Name: Devin Hale  DOB: 1950-11-17  MRN: [id number]  Primary Cardiologist: Virl Axe, MD  Chart reviewed as part of pre-operative protocol coverage. Because of Devin Hale past medical history and time since last visit, he will require a follow-up visit in order to better assess preoperative cardiovascular risk.  Patient has pending cardiac work-up/imaging with recommendation for follow-up after that time and visit scheduled for 04/14/21 on review of chart.    Await MD recommendations at follow-up visit after this work-up.   Arvil Chaco, PA-C  03/06/2021, 3:08 PM

## 2021-03-06 NOTE — Telephone Encounter (Signed)
I just spoke with the patient and he is stuck on the note see below which was from his AVS  Call to let us know if PET scan at [facility] isn't being scheduled, and we will then order a CT scan of your chest  We couldn't get him in any sooner to HRCT until 03/19/21 which would have been after the OV with Dr. Halford Chessman on 03/12/21.  The patient decided to move out his appt. Now scheduled 04/02/21  '@9'$ :45am

## 2021-03-06 NOTE — Telephone Encounter (Signed)
Noted  

## 2021-03-06 NOTE — Telephone Encounter (Signed)
Please call to discuss a steroid treatment that patient states he needs to be on.

## 2021-03-06 NOTE — Telephone Encounter (Signed)
I tried to get the CT appt moved up the first available appt would be 03/19/21. I left a message for the patient to call us back because Dr. Halford Chessman wanted him to have the CT before seeing him. Patient will need to reschedule his appt with Dr. Halford Chessman until after he gets his HR CT done

## 2021-03-06 NOTE — Telephone Encounter (Signed)
Spoke with pt. Pt reports it was suggested that he start a low dose corticosteroid tx for myopathy.  Pt has not started this yet. He wants to discuss with Dr. Caryl Comes.  Pt asks if Dr. Caryl Comes would review Dr. Lattie Haw Hobson-Webb's protocol in chart and advise from a cardiac standpoint if he should begin this treatment.  Dr. Venetia Maxon is with [facility].   Pt also is asking for follow up on medical release re Eliquis that was sent from [facility].  Notified pt I will make Dr. Caryl Comes and his nurse aware of both requests above.   Pt appreciative and has no further questions/concerns at this time.

## 2021-03-06 NOTE — Telephone Encounter (Signed)
Routed to Dr. Halford Chessman as an Devin Hale

## 2021-03-06 NOTE — Telephone Encounter (Signed)
Lm for patient.  Rodena Piety, can you see if CT can be scheduled prior to 03/12/2021

## 2021-03-09 LAB — CUP PACEART REMOTE DEVICE CHECK
Battery Remaining Longevity: 77 mo
Battery Remaining Percentage: 80 %
Battery Voltage: 2.99 V
Brady Statistic AP VP Percent: 24 %
Brady Statistic AP VS Percent: 1 %
Brady Statistic AS VP Percent: 76 %
Brady Statistic AS VS Percent: 1 %
Brady Statistic RA Percent Paced: 24 %
Brady Statistic RV Percent Paced: 99 %
Date Time Interrogation Session: 20220916020012
Implantable Lead Implant Date: 20210224
Implantable Lead Implant Date: 20210224
Implantable Lead Location: 753859
Implantable Lead Location: 753860
Implantable Lead Model: 7842
Implantable Lead Serial Number: 1032895
Implantable Pulse Generator Implant Date: 20210224
Lead Channel Impedance Value: 430 Ohm
Lead Channel Impedance Value: 580 Ohm
Lead Channel Pacing Threshold Amplitude: 1 V
Lead Channel Pacing Threshold Amplitude: 1.25 V
Lead Channel Pacing Threshold Pulse Width: 1 ms
Lead Channel Pacing Threshold Pulse Width: 1 ms
Lead Channel Sensing Intrinsic Amplitude: 10.5 mV
Lead Channel Sensing Intrinsic Amplitude: 2.3 mV
Lead Channel Setting Pacing Amplitude: 1.5 V
Lead Channel Setting Pacing Amplitude: 2.5 V
Lead Channel Setting Pacing Pulse Width: 1 ms
Lead Channel Setting Sensing Sensitivity: 2 mV
Pulse Gen Model: 2272
Pulse Gen Serial Number: 9197143

## 2021-03-09 NOTE — Telephone Encounter (Signed)
[facility] faxed with update They will use propofol sedation

## 2021-03-10 NOTE — Telephone Encounter (Signed)
Attempted to reach Dr. Caryl Comes by phone today to discuss the message below and his clearance. No call back from Dr. Caryl Comes & it looks like Jacquelyn advised he will need an in office appointment to discuss clearance.  Please call the patient and offer an in office appointment on 03/17/21 at 8:00 am (he has an ICD).

## 2021-03-10 NOTE — Telephone Encounter (Signed)
I am coming on board preop team today. Complex patient. Last phone notes from Dr. Caryl Comes and staff outline a plan for myocardial PET. As below, Jacquelyn, PA recommended that patient have f/u OV 04/14/21 to provide clearance for flex sig procedure. (This is a telephone appt.) He is also being followed closely by hepatology at outside facility, so unsure if delay is necessary.   I will reach out to Dr. Caryl Comes for his input on cardiac clearance for his flex-sig versus keeping f/u 04/14/21. Dr. Caryl Comes - Please route response to P CV DIV PREOP (the pre-op pool). Once we have reply will reach out to pharmacy team. Thank you.

## 2021-03-11 ENCOUNTER — Ambulatory Visit: Admit: 2021-03-11 | Discharge: 2021-03-11 | Payer: MEDICARE

## 2021-03-11 ENCOUNTER — Ambulatory Visit: Admit: 2021-03-11 | Discharge: 2021-03-11 | Payer: MEDICARE | Attending: "Endocrinology | Primary: "Endocrinology

## 2021-03-11 DIAGNOSIS — C220 Liver cell carcinoma: Principal | ICD-10-CM

## 2021-03-11 DIAGNOSIS — K7460 Unspecified cirrhosis of liver: Principal | ICD-10-CM

## 2021-03-11 DIAGNOSIS — Z1289 Encounter for screening for malignant neoplasm of other sites: Principal | ICD-10-CM

## 2021-03-11 DIAGNOSIS — B182 Chronic viral hepatitis C: Principal | ICD-10-CM

## 2021-03-11 DIAGNOSIS — M810 Age-related osteoporosis without current pathological fracture: Principal | ICD-10-CM

## 2021-03-11 DIAGNOSIS — M6019 Interstitial myositis, multiple sites: Principal | ICD-10-CM

## 2021-03-11 NOTE — Telephone Encounter (Addendum)
Returned the patients call. Advised the patient that based on the review of his chart the recommended 03/17/21 appt with Dr. Caryl Comes is for pre-op eval for his pending procedure with [facility]. The 04/14/21 telephone appt is to f/u after his PET scan.  Offered to schedule the 03/17/21 appt for pre-op with Dr. Caryl Comes that [name], [place] suggested could be used. Patient became upset. Stating that it is ridiculous that he would need an appt just to get an ok for him to stop his Eliquis. He has had to hold Eliquis in the past for different procedures/surgeries. Patient sts that we are delaying his care and need to just send the requested clearance to [facility]. Explained to the patient our pre-op protocol and the reason the appt was recommended. Patient sts he does not know why people outside of Dr. Caryl Comes are making this determination. Adv the patient that I will fwd the message to Dr. Caryl Comes to given his recommendation regarding the pre-op cardiac clearance rqst.  He declined to schedule an appt and insist that Dr. Caryl Comes needs to call him asap. Patient demanding that Dr. Caryl Comes call him today and that time is of the essence. Adv the patient that was probably unlikely. I will however fwd the message to Dr. Caryl Comes urgently.   Patient also sts that he has never received a answer to his previous message as to whether he can start a low dose corticosteroid prescribed by another provider. He would like Dr. Olin Pia thoughts from a cardiac standpoint.

## 2021-03-11 NOTE — Telephone Encounter (Signed)
Lmov to call office

## 2021-03-11 NOTE — Telephone Encounter (Signed)
Patient confused as to why he should have appointment before his PET scan on Oct 3rd Thinks Dr Caryl Comes would like to see those results first Please call to discuss

## 2021-03-12 ENCOUNTER — Ambulatory Visit: Payer: 59 | Admitting: Pulmonary Disease

## 2021-03-12 NOTE — Progress Notes (Signed)
Remote pacemaker transmission.   

## 2021-03-16 ENCOUNTER — Encounter: Payer: Self-pay | Admitting: Internal Medicine

## 2021-03-16 NOTE — Progress Notes (Unsigned)
Discussed discontinuing Apixoban as no detected interval afib Will submit clearance if necessary  Will begin ASA 81 for the presence of CAD Will continue STATIN for now  last LAD 2021 104 He will alert [place] when he has his PET

## 2021-03-16 NOTE — Telephone Encounter (Signed)
Patient calling back again for status.

## 2021-03-16 NOTE — Telephone Encounter (Signed)
Deboraha Sprang, MD [phone])  SentKerrin Mo  6:03 PM  To: Lamar Laundry, RN          Message  Lattie Haw.  He can hold his apixaban for the procedure.  Thx. Also. From my point of view I have NO problem w low dose corticosteroids     Thx.  SK .

## 2021-03-17 NOTE — Telephone Encounter (Signed)
Robin from [facility] called to get an update on the patient's surgical clearance.  The patient told Shirlean Mylar that the patient is no longer taking Eliquis. She just needs documentation to confirm this to schedule the procedure   Fax: [phone]

## 2021-03-17 NOTE — Telephone Encounter (Signed)
   Name: Devin Hale  DOB: 15-Feb-1951  MRN: [id number]  Primary Cardiologist: Virl Axe, MD  Chart reviewed as part of pre-operative protocol coverage. Per Dr. Caryl Comes, the patient was taken off Apixaban as there was no detected interval atrial fibrillation. He was started on ASA 81mg  PO QD for the presence of CAD. He will also continue his statin. He is open to submit a clearance if needed. The patient was previously scheduled for cardiac clearance with Dr. Caryl Comes 04/14/21.   Pre-op covering staff: - Please contact requesting surgeon's office via preferred method (i.e, phone, fax) to confirm that they will or will not need the patient to keep his appointment for formal clearance with Dr. Caryl Comes on 04/14/21.   If applicable, this message will also be routed to pharmacy pool and/or primary cardiologist for input on holding anticoagulant/antiplatelet agent as requested below so that this information is available to the clearing provider at time of patient's appointment.   Kathyrn Drown, NP  03/17/2021, 11:36 AM

## 2021-03-17 NOTE — Telephone Encounter (Signed)
Facility received med clearance and no card clearance required patient ok to proceed per [facility]

## 2021-03-17 NOTE — Telephone Encounter (Signed)
Patient calling to speak with [name]

## 2021-03-18 ENCOUNTER — Ambulatory Visit: Admit: 2021-03-18 | Discharge: 2021-03-19 | Payer: MEDICARE

## 2021-03-18 DIAGNOSIS — K7460 Unspecified cirrhosis of liver: Principal | ICD-10-CM

## 2021-03-18 MED ORDER — ASPIRIN EC 81 MG PO TBEC: 81.0000 mg | DELAYED_RELEASE_TABLET | Freq: Every day | ORAL | Status: DC

## 2021-03-18 NOTE — Telephone Encounter (Signed)
I spoke with the patient. He had questions regarding his discussion with Dr. Caryl Comes by phone on 03/16/21. He advised he had been at [facility] all day and was not quite absorbing what he and Dr. Caryl Comes discussed.   1) I have reviewed with him Dr. Olin Pia recommendations as documented in his chart:   Progress Notes by Deboraha Sprang, MD at 03/16/2021 5:43 PM  Author: Deboraha Sprang, MD Author Type: Physician Filed: 03/16/2021  5:51 PM  Note Status: Sign when Signing Visit Cosign: Cosign Not Required Encounter Date: 03/16/2021  Editor: Deboraha Sprang, MD (Physician)             Discussed discontinuing Apixoban as no detected interval afib Will submit clearance if necessary  Will begin ASA 81 for the presence of CAD Will continue STATIN for now  last LAD 2021 104 He will alert [place] when he has his PET      2) I have also advised him per Dr. Caryl Comes, that low dose corticosteroids are ok for him to take.   3) He wanted to know specifically if Prolia would be ok for him to take as recommended by another physician for osteoporosis.   I have advised I will reach out to Dr. Caryl Comes / Pharmacy team to clarify and will call him back.  He voices understanding of the above and is agreeable.   4) He also stated that the clearance that was just given for his GI procedure, will not actually take place until probably January.  He inquired if he will need to hold ASA.  I advised that he will need to follow up with GI and advise them that he has stopped eliquis long term but is taking ASA and see what they advise.  He will follow up as needed for this.

## 2021-03-18 NOTE — Telephone Encounter (Signed)
I spoke with the patient. I have advised him of Pharm D recommendations regarding Prolia. The patient voices understanding and was appreciative for the call back.

## 2021-03-18 NOTE — Telephone Encounter (Signed)
Ok to start Prolia injections. Main cardiovascular side effect to look out for is peripheral edema (5% reported incidence), angina (3% reported incidence), and HTN (4% reported incidence).

## 2021-03-18 NOTE — Telephone Encounter (Signed)
Patient calling to check on status  Patient will be leaving house around 11a and won't be back til ~2p - may be difficult to get in touch with while out

## 2021-03-23 DIAGNOSIS — M810 Age-related osteoporosis without current pathological fracture: Principal | ICD-10-CM

## 2021-03-24 ENCOUNTER — Ambulatory Visit
Admit: 2021-03-24 | Discharge: 2021-03-25 | Payer: MEDICARE | Attending: Radiation Oncology | Primary: Radiation Oncology

## 2021-03-24 ENCOUNTER — Telehealth: Payer: Self-pay | Admitting: Internal Medicine

## 2021-03-24 NOTE — Telephone Encounter (Signed)
Patient calling  States that he heard from the insurance company that the PET scan has been denied even though we got approval for it Would like to discuss and clarify Please call

## 2021-03-24 NOTE — Telephone Encounter (Signed)
Per Secure Chat message sent by Ciro Backer (Precert): I will look at it... If it comes that the tet was performed. I will have to submit a retro auth since [employer] extend [employer]. It was authorized but the auth expired. Wow, looking at the history on his auth. [facility] tried to start a new request on 9/26 and it denied because auth was on file until 9/29.  It was started 5 times.  sent the retro auth request. I will ask Caren Griffins her thoughts because I think since there was already an [place] on file that it will be okay. I will let you know what I find.

## 2021-03-24 NOTE — Telephone Encounter (Signed)
I spoke with the patient. He advised that he did not have his Myocardial sarcoid PET scan done at [facility] until 03/23/21. He states he was called today by [facility] stating that his scan was not authorized.  I advised the patient that the authorization for his scan ran out on 03/19/21, but that when I had originally reached out to him on 02/10/21, after speaking with the [facility] PET imaging department, his scan was scheduled for 03/19/21 at 11:45 am.  I advised the patient I was not made aware that his appointment date was pushed out to 03/23/21, so I was not aware to reach out to our billing department to update his authorization.  I advised the patient, that I will need to reach out to our billing department and see if anything can be done in regards to the test being rescheduled to yesterday.   The patient is aware that myself or someone from the billing department will reach back out to him to follow up.   The patient voices understanding and is agreeable.

## 2021-03-27 ENCOUNTER — Telehealth: Payer: Self-pay | Admitting: Internal Medicine

## 2021-03-27 ENCOUNTER — Telehealth: Payer: Self-pay | Admitting: Pulmonary Disease

## 2021-03-27 NOTE — Telephone Encounter (Signed)
Reviewed cardiac PET scan report from 03/23/21.  Only had limited view of lower chest and upper abdomen.  Several areas of extracardiac hypermetabolic activity on the limited field of view, specifically bilateral hilar and mediastinal lymph nodes. There are also several foci of hypermetabolic activity noted in the liver  and spleen.  He needs to proceed with high resolution CT chest as previously scheduled to get full assessment of lung involvement from possible sarcoidosis.

## 2021-03-27 NOTE — Telephone Encounter (Signed)
Blanch Media from [employer] called to advise that it's already a Approved auth for the patients PET Scan in the system

## 2021-03-27 NOTE — Telephone Encounter (Signed)
Lm for patient.  

## 2021-03-27 NOTE — Telephone Encounter (Signed)
I sent a copy of the patient's myocardial PET sarcoid study to Dr. Caryl Comes to review.   He sent a secure chat message back stating: The issue for the PET was to follow up on a suspicious cMRI ( for Ssarcoid) the likelihood of that is higher now with abnormal PET and so would recommend that we pursue the referral to Dr Joneen Caraway at [facility] is that agreeable with him thx [name]   I later spoke with Dr. Caryl Comes by phone to advise if this confirms the diagosis of sarcoidosis, and per Dr. 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  He would like the patient to see Dr. Burt Knack at the [facility] to consult for this further.   I attemtped to call the patient to discuss this further. No answer- I left a detailed message of Dr. Olin Pia recommendations and interpretation of the test (ok per DPR). The message did time out due to the length of the message.   I attempted to call back and leave further information regarding his authorization status for the test (see 03/24/21 phone note), but the phone rang once and stopped- no answer & no voice mail picked up.   Will await a call back from the patient as I was able to leave a message the 1st time asking him to call back further to discuss if needed/ he can await his follow up with Dr. Caryl Comes on 04/14/21.

## 2021-03-27 NOTE — Telephone Encounter (Signed)
Patient had his PET scan done at [facility] 03/23/2021. He is now calling to see if Dr. Halford Chessman can see the results of the PET scan in hopes that he can get the information needed for his lungs. Patient really doesn't want to have Chest CT that is scheduled on 03/31/21 if Dr. Halford Chessman has the information that he needs on the PET

## 2021-03-27 NOTE — Telephone Encounter (Signed)
Please call to discuss PET scan results.

## 2021-03-30 ENCOUNTER — Institutional Professional Consult (permissible substitution): Admit: 2021-03-30 | Discharge: 2021-03-31 | Payer: MEDICARE

## 2021-03-30 ENCOUNTER — Ambulatory Visit: Payer: 59

## 2021-03-30 DIAGNOSIS — M810 Age-related osteoporosis without current pathological fracture: Principal | ICD-10-CM

## 2021-03-30 NOTE — Telephone Encounter (Signed)
Patient calling to speak with someone in precert re pet scan auth   Patient advised of note below and advised if he has further questions about PET auth to call Blanch Media at [employer] .     Patient states she will be his next call followed by a call to  administration.

## 2021-03-30 NOTE — Telephone Encounter (Signed)
[employer] navigator calling States the approved auth that expired needs to be retracted and an appeal made on the one denied needs to be retroactive States that the new one won't be approved until the approved one is gone, even if it wasn't used If needed, call the provider line at [phone]

## 2021-03-30 NOTE — Telephone Encounter (Signed)
Patient CXL Ct appt on 03/30/21 on 03/12/21 by Rhys Martini.  Patient CXL Ct appt on 03/31/21 on 03/30/21 by Jodene Nam.  I have CXL his ROV appt with Dr. Halford Chessman per phone note per patient's request

## 2021-03-30 NOTE — Telephone Encounter (Signed)
Patient is aware of below message and voiced his understanding.  Patient stated that he would like to cancel CT and OV with Dr. Halford Chessman. Patient stated that she has been 'pricked a lot recently and he would like to hold off on CT for now'.  OV canceled per patient request.   Rodena Piety, please cancel CT.   Routing to Dr. Halford Chessman to make aware

## 2021-03-31 ENCOUNTER — Ambulatory Visit: Payer: 59

## 2021-04-02 ENCOUNTER — Ambulatory Visit: Payer: 59 | Admitting: Pulmonary Disease

## 2021-04-02 NOTE — Telephone Encounter (Signed)
Good morning Devin Hale!  Just checking to see if there are any updates on this?  Thank you!

## 2021-04-03 NOTE — Telephone Encounter (Signed)
Ciro Backer  Sent: Thu April 02, 2021  6:25 PM  To: Emily Filbert, RN; Shanda Howells E          Message  Good afternoon, the new Josem Kaufmann was denied because it was a retro request. I had the approved case withdrawn after I submitted the new auth. They are advising to submit the claim for payment and once received they will advise on where to send the medical record for adjudication. Thank you.

## 2021-04-03 NOTE — Telephone Encounter (Signed)
LMOV for patient to call me back to discuss.

## 2021-04-06 ENCOUNTER — Ambulatory Visit
Admit: 2021-04-06 | Discharge: 2021-04-07 | Payer: MEDICARE | Attending: Orthopaedic Surgery | Primary: Orthopaedic Surgery

## 2021-04-06 NOTE — Telephone Encounter (Signed)
Spoke to Mr. Loughry regarding the precert after speaking with Caren Griffins in billing.  [facility] will start the appeal process for approval of the PET and we will work closely with them to give any information that is needed for approval.  Will update Mr. Whitworth at [employer], he was appreciative of the phone call.

## 2021-04-09 DIAGNOSIS — H903 Sensorineural hearing loss, bilateral: Principal | ICD-10-CM

## 2021-04-13 DIAGNOSIS — M19041 Primary osteoarthritis, right hand: Principal | ICD-10-CM

## 2021-04-14 ENCOUNTER — Other Ambulatory Visit: Payer: Self-pay

## 2021-04-14 ENCOUNTER — Telehealth (INDEPENDENT_AMBULATORY_CARE_PROVIDER_SITE_OTHER): Payer: 59 | Admitting: Internal Medicine

## 2021-04-14 ENCOUNTER — Telehealth: Payer: Self-pay | Admitting: Internal Medicine

## 2021-04-14 ENCOUNTER — Encounter: Payer: Self-pay | Admitting: Internal Medicine

## 2021-04-14 VITALS — Ht 69.0 in | Wt 172.0 lb

## 2021-04-14 DIAGNOSIS — Z950 Presence of cardiac pacemaker: Secondary | ICD-10-CM

## 2021-04-14 DIAGNOSIS — D869 Sarcoidosis, unspecified: Secondary | ICD-10-CM

## 2021-04-14 DIAGNOSIS — I442 Atrioventricular block, complete: Secondary | ICD-10-CM

## 2021-04-14 NOTE — Progress Notes (Signed)
Patient ID: Devin Hale, male   DOB: 24-Aug-1950, 70 y.o.   MRN: [id number]     Electrophysiology TeleHealth Note   Due to national recommendations of social distancing due to COVID 19, an audio/video telehealth visit is felt to be most appropriate for this patient at this time.  See MyChart message from today for the patient's consent to telehealth for [facility].   Date:  04/14/2021   ID:  Devin Hale, DOB October 17, 1950, MRN [id number]  Location: patient's home  Provider location: [address] [place]  Evaluation Performed: Follow-up visit  PCP:  Marguerita Merles, MD  Cardiologist:     Electrophysiologist:  SK     History of Present Illness:    Devin Hale is a 70 y.o. male who presents via audio/video conferencing for a telehealth visit today.  Since last being seen in our clinic for pacemaker implanted for high-grade heart block with a hiss bundle lead and the procedure complicated by pericarditis, atrial fibrillation for which he takes Eliquis and a possible diagnosis of sarcoid suggested by cMRI,  the patient reports things are pretty stable  Notes reviewed from [facility]-referral to pulmonary at [facility] for question of sarcoid; has not had a PET scan.  I discussed referral to [facility];    Genetics evaluation reviewed, (very thorough summary) noting the muscle biopsy showing "myopathic features with increased MAC-1. 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  Genetic causes of myositis were recommended  Treated hepatocellular ca fall 2021; recurrent MRI abnormal imaging>> tumor board ? Collateral damage 2/2 ablation.    The patient denies chest pain,, nocturnal dyspnea, orthopnea or peripheral edema.  There have been no palpitations, lightheadedness or syncope.  Complains of some shortness of breath .   Date   Cr              K          Hgb   12/19             13.7   10/21  0.99 4.0          14.4    6/22 (CE) 0.98 4.7          15.2      DATE  TEST EF    2/21 [facility]    % Nonobstructive Cx/RCA  4/21 Echo 55-65% Small mod pericardial effusion  9/21 cMRI   LGE in noncardiac distribution suggestive of sarcoid  9/22 PET (CE)  Perfusion normal Abnormal Metabolism : mid septal , basal anterior basal inferior wall   Abnormal PET was reviewed with the patient.  Unfortunately, did not document.  Also has been referred for high-resolution CT with consideration of referral to [facility]      Past medical history notable for chronic hepatitis C followed at [facility].  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    Past Medical History:  Diagnosis Date   Complete heart block ([place])    Hepatitis    Hepatitis C    Pacemaker     Past Surgical History:  Procedure Laterality Date   COLONOSCOPY WITH PROPOFOL N/A 05/27/2020   Procedure: COLONOSCOPY WITH PROPOFOL;  Surgeon: Lucilla Lame, MD;  Location[facility] ENDOSCOPY;  Service: Endoscopy;  Laterality: N/A;   ESOPHAGOGASTRODUODENOSCOPY (EGD) WITH PROPOFOL N/A 05/27/2020   Procedure: ESOPHAGOGASTRODUODENOSCOPY (EGD) WITH PROPOFOL;  Surgeon: Lucilla Lame, MD;  Location: [facility] ENDOSCOPY;  Service: Endoscopy;  Laterality: N/A;   KNEE ARTHROSCOPY Left 1982   LIVER BIOPSY     NASAL SINUS SURGERY  2019   TEMPORARY PACEMAKER N/A 08/13/2019   Procedure: TEMPORARY PACEMAKER;  Surgeon: [name], MD;  Location: [place] CV LAB;  Service: Cardiovascular;  Laterality: N/A;   TONSILLECTOMY      Current Outpatient Medications  Medication Sig Dispense Refill   ascorbic acid (VITAMIN C) 500 MG tablet Take 500 mg by mouth 2 (two) times daily.      atorvastatin (LIPITOR) 40 MG tablet Take 1 tablet by mouth once daily 90 tablet 3   cholecalciferol (VITAMIN D3) 25 MCG (1000 UNIT) tablet Take 1,000 Units by mouth in the morning and at bedtime.       Coenzyme Q10 (CO Q 10) 100 MG CAPS Take 100 mg by mouth daily.     denosumab (PROLIA) 60 MG/ML SOSY injection Inject into the skin.     diclofenac Sodium (VOLTAREN) 1 % GEL Apply topically.     magnesium oxide (MAG-OX) 400 MG tablet Take 200 mg by mouth 2 (two) times daily.     Multiple Vitamin (MULTIVITAMIN WITH MINERALS) TABS tablet Take 1 tablet by mouth daily.     Omega-3 Fatty Acids (FISH OIL) 1000 MG CAPS Take by mouth daily.     predniSONE (DELTASONE) 10 MG tablet Take 10 mg by mouth daily.     vitamin E 200 UNIT capsule Take 200 Units by mouth daily.     Vitamins-Lipotropics (COMPLEX B-100-INOSITOL) TBCR Take by mouth.     Whey Protein POWD Take by mouth daily.     aspirin EC 81 MG tablet Take 1 tablet (81 mg total) by mouth daily. Swallow whole. (Patient not taking: Reported on 04/14/2021)     Docosahexaenoic Acid (DHA COMPLETE PO) Take 500 mg by mouth daily. (Patient not taking: No sig reported)     hydrocortisone (ANUSOL-HC) 2.5 % rectal cream Place 1 application rectally 2 (two) times daily. (Patient not taking: No sig reported) 30 g 0   No current facility-administered medications for this visit.    Allergies:   Patient has no known allergies.   Social History:  The patient  reports that he has never smoked. He has never used smokeless tobacco. He reports that he does not drink alcohol and does not use drugs.   Family History:  The patient's   family history includes Heart attack in his father; Non-Hodgkin's lymphoma in his mother.   ROS:  Please see the history of present illness.   All other systems are personally reviewed and negative.    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 Hepatocellular carcinoma    Neuromuscular issue currently  >>  biopsy inconclusive for inclusion body myositis    Asymmetric edema    Lengthy discussion regarding the PET and possible diagnosis of sarcoid; high resolution  CT pending.  Again discussion of  [facility].  He notes that [facility] and [facility] are considerations as clinical centers of excellence.    Also discussion regarding behaviour deriving from his frustrations.  This has been reviewed with clinic management, specifically that not withstanding his frustrations, repeats of his, and his mind justified behaviors, would likely result in termination of his care from this practice.  I have also tried outlined for him that the timeframe for being evaluated a place like [facility] may will be measured in months.  He will stay on his steroids as per his neurology now for his myositis   COVID 19 screen The patient denies symptoms of COVID 19 at this time.  The importance of social distancing was discussed today.  Follow-up:  72m    Current medicines are reviewed at length with the patient today.   The patient does not have concerns regarding his medicines.  The following changes were made today:  none  Labs/ tests ordered today include:   No orders of the defined types were placed in this encounter.    Patient Risk:  after full review of this patients clinical status, I feel that they are at moderate risk at this time.  Today, I have spent 35 minutes with the patient with telehealth technology discussing the above.   Future tests ( post COVID )   months  Patient Risk:  after full review of this patients clinical status, I feel that they are at moderate risk at this time.  Today, I have spent 33  minutes with the patient with telehealth technology discussing the above and  reviewing the old records

## 2021-04-14 NOTE — Telephone Encounter (Signed)
Patient would like a referral to [facility] for sarcoidosis clinic.  Fax number for Dr.  Deatra Canter [phone]  attn: Earlean Shawl. Please call to discuss.

## 2021-04-14 NOTE — Patient Instructions (Signed)
Medication Instructions:  - Your physician recommends that you continue on your current medications as directed. Please refer to the Current Medication list given to you today.  *If you need a refill on your cardiac medications before your next appointment, please call your pharmacy*   Lab Work: - none ordered  If you have labs (blood work) drawn today and your tests are completely normal, you will receive your results only by: [place] (if you have MyChart) OR A paper copy in the mail If you have any lab test that is abnormal or we need to change your treatment, we will call you to review the results.   Testing/Procedures: - none ordered   Follow-Up: At [facility], you and your health needs are our priority.  As part of our continuing mission to provide you with exceptional heart care, we have created designated Provider Care Teams.  These Care Teams include your primary Cardiologist (physician) and Advanced Practice Providers (APPs -  Physician Assistants and Nurse Practitioners) who all work together to provide you with the care you need, when you need it.  We recommend signing up for the patient portal called "MyChart".  Sign up information is provided on this After Visit Summary.  MyChart is used to connect with patients for Virtual Visits (Telemedicine).  Patients are able to view lab/test results, encounter notes, upcoming appointments, etc.  Non-urgent messages can be sent to your provider as well.   To learn more about what you can do with MyChart, go to [email or web address].    Your next appointment:   3 month(s)  The format for your next appointment:   In Person  Provider:   Virl Axe, MD   Other Instructions N/a

## 2021-04-15 DIAGNOSIS — M19041 Primary osteoarthritis, right hand: Principal | ICD-10-CM

## 2021-04-15 NOTE — Telephone Encounter (Signed)
Attempted to call the patient. No answer- I left a message I will work on getting his referral information sent to the [facility] per his request to the #/ contact info provided.  I asked that he call back with any further questions/ concerns.

## 2021-04-15 NOTE — Telephone Encounter (Signed)
Fax confirmation received for [facility]- [phone].

## 2021-04-15 NOTE — Telephone Encounter (Signed)
I called the [facility] at [phone] to confirm the fax # provided by the patient was correct prior to faxing his referral info.  Per [facility] scheduling the fax # provided to [place] in not preferred. They advised to please fax records to [phone].  The patient's records/ referral have been faxed to [phone]. Awaiting fax confirmation.

## 2021-04-16 DIAGNOSIS — M19041 Primary osteoarthritis, right hand: Principal | ICD-10-CM

## 2021-04-27 ENCOUNTER — Ambulatory Visit: Payer: 59 | Admitting: Occupational Therapy

## 2021-05-05 ENCOUNTER — Ambulatory Visit: Admit: 2021-05-05 | Discharge: 2021-05-05 | Payer: MEDICARE

## 2021-05-05 DIAGNOSIS — K641 Second degree hemorrhoids: Principal | ICD-10-CM

## 2021-05-06 DIAGNOSIS — K5900 Constipation, unspecified: Principal | ICD-10-CM

## 2021-05-11 ENCOUNTER — Telehealth: Payer: Self-pay | Admitting: Internal Medicine

## 2021-05-11 DIAGNOSIS — Z79899 Other long term (current) drug therapy: Secondary | ICD-10-CM

## 2021-05-11 DIAGNOSIS — E782 Mixed hyperlipidemia: Secondary | ICD-10-CM

## 2021-05-11 DIAGNOSIS — Z1289 Encounter for screening for malignant neoplasm of other sites: Principal | ICD-10-CM

## 2021-05-11 DIAGNOSIS — K7460 Unspecified cirrhosis of liver: Principal | ICD-10-CM

## 2021-05-11 DIAGNOSIS — C220 Liver cell carcinoma: Principal | ICD-10-CM

## 2021-05-11 DIAGNOSIS — B182 Chronic viral hepatitis C: Principal | ICD-10-CM

## 2021-05-11 NOTE — Telephone Encounter (Signed)
Patient calling to ask for lipid panel orders.  He was unable to get done prior to now with pcp add on .    Patient would like this lipid [facility] at [facility] .

## 2021-05-11 NOTE — Telephone Encounter (Signed)
Attempted to call the patient. No answer- I left a message stating I would be glad to send in his lipid panel order.  I need to know: 1) Which [place] location so I can fax the order 2) Roughly when he think he might be going to have this done.  I asked that he call back to clarify- he can leave the info with the front desk unless he just needs to speak with me directly.

## 2021-05-11 NOTE — Telephone Encounter (Signed)
Lab order for lipid panel was placed and faxed to:  [facility] [phone] Fax confirmation received.   Original order also placed in the mail to the patient.

## 2021-05-11 NOTE — Telephone Encounter (Signed)
Patient will fast for lipid and will go between now and the next 2 months .   Please fax to [place] [facility] [employer] and mail patient copy of order for back up to confirmed epic address .

## 2021-05-21 NOTE — Telephone Encounter (Signed)
Patient calling Wants a clarification on lab orders sent States our information is conflicting with theirs Please call to discuss

## 2021-05-21 NOTE — Telephone Encounter (Signed)
I spoke with the patient and advised him that I faxed a Lipid panel order under Dr. Olin Pia name to [facility] in [place] on 05/11/21 and mailed the original copy to him.  He advised [facility] did not have the order, but he has had trouble with them before. He had not received the copy I mailed to him, but he will look in his mail box today.  I have advised the patient if he would like to pick up a hard copy, I will reprint and leave for him at the front desk.  I deferred this at this time and will check his mail.  I have advised him to call me back if he would like me to do anything further in this regard.  The patient voices understanding and is agreeable.

## 2021-05-22 ENCOUNTER — Telehealth: Payer: Self-pay | Admitting: Internal Medicine

## 2021-05-22 NOTE — Telephone Encounter (Signed)
Patient would like a call regarding his lab orders.

## 2021-05-25 ENCOUNTER — Other Ambulatory Visit: Payer: Self-pay | Admitting: Pulmonary Disease

## 2021-05-25 DIAGNOSIS — R911 Solitary pulmonary nodule: Secondary | ICD-10-CM

## 2021-05-25 NOTE — Telephone Encounter (Signed)
Attempted to call the patient. No answer- I left a message to please call back to discuss his lab order.

## 2021-05-25 NOTE — Telephone Encounter (Signed)
Patient returning call.

## 2021-05-25 NOTE — Telephone Encounter (Signed)
Attempted to call the patient. No answer- I left a message I will call him back in the morning. He is welcome to try me back this afternoon if he receives the message in the next few minutes, please have me paged.

## 2021-05-26 NOTE — Telephone Encounter (Signed)
Patient returning call.

## 2021-05-26 NOTE — Telephone Encounter (Signed)
I spoke with the patient. He confirmed he received the lipid panel order I mailed to him yesterday, but [facility] did not have an order in the system.  I advised him that [place] typically can't see our orders in the system which is why I faxed them a copy (received a fax confirmation) and mailed him a hard copy.  He advised he will have this done in the near future.  He was appreciative for the call back.

## 2021-05-28 DIAGNOSIS — H903 Sensorineural hearing loss, bilateral: Principal | ICD-10-CM

## 2021-06-01 ENCOUNTER — Institutional Professional Consult (permissible substitution): Admit: 2021-06-01 | Discharge: 2021-06-01 | Payer: MEDICARE

## 2021-06-01 ENCOUNTER — Ambulatory Visit: Admit: 2021-06-01 | Discharge: 2021-06-01 | Payer: MEDICARE | Attending: Family | Primary: Family

## 2021-06-01 DIAGNOSIS — H73892 Other specified disorders of tympanic membrane, left ear: Principal | ICD-10-CM

## 2021-06-01 DIAGNOSIS — H903 Sensorineural hearing loss, bilateral: Principal | ICD-10-CM

## 2021-06-01 DIAGNOSIS — H6982 Other specified disorders of Eustachian tube, left ear: Principal | ICD-10-CM

## 2021-06-03 ENCOUNTER — Encounter
Admission: RE | Admit: 2021-06-03 | Discharge: 2021-06-03 | Disposition: A | Discharge status: Home / Self Care | Payer: 59 | Source: Ambulatory Visit | Attending: Pulmonary Disease | Admitting: Pulmonary Disease

## 2021-06-03 DIAGNOSIS — R911 Solitary pulmonary nodule: Secondary | ICD-10-CM | POA: Diagnosis present

## 2021-06-03 LAB — GLUCOSE, CAPILLARY: Glucose-Capillary: 87 mg/dL (ref 70–99)

## 2021-06-03 MED ORDER — FLUDEOXYGLUCOSE F - 18 (FDG) INJECTION
8.9000 | Freq: Once | INTRAVENOUS | Status: AC | PRN
  Administered 2021-06-03: 12:00:00 9.1 via INTRAVENOUS

## 2021-06-05 ENCOUNTER — Ambulatory Visit (INDEPENDENT_AMBULATORY_CARE_PROVIDER_SITE_OTHER): Payer: 59

## 2021-06-05 DIAGNOSIS — I442 Atrioventricular block, complete: Secondary | ICD-10-CM | POA: Diagnosis not present

## 2021-06-05 LAB — CUP PACEART REMOTE DEVICE CHECK
Battery Remaining Longevity: 75 mo
Battery Remaining Percentage: 77 %
Battery Voltage: 2.99 V
Brady Statistic AP VP Percent: 24 %
Brady Statistic AP VS Percent: 1 %
Brady Statistic AS VP Percent: 76 %
Brady Statistic AS VS Percent: 1 %
Brady Statistic RA Percent Paced: 24 %
Brady Statistic RV Percent Paced: 99 %
Date Time Interrogation Session: 20221216020013
Implantable Lead Implant Date: 20210224
Implantable Lead Implant Date: 20210224
Implantable Lead Location: 753859
Implantable Lead Location: 753860
Implantable Lead Model: 7842
Implantable Lead Serial Number: 1032895
Implantable Pulse Generator Implant Date: 20210224
Lead Channel Impedance Value: 410 Ohm
Lead Channel Impedance Value: 540 Ohm
Lead Channel Pacing Threshold Amplitude: 0.875 V
Lead Channel Pacing Threshold Amplitude: 1 V
Lead Channel Pacing Threshold Pulse Width: 1 ms
Lead Channel Pacing Threshold Pulse Width: 1 ms
Lead Channel Sensing Intrinsic Amplitude: 10.5 mV
Lead Channel Sensing Intrinsic Amplitude: 3.5 mV
Lead Channel Setting Pacing Amplitude: 1.125
Lead Channel Setting Pacing Amplitude: 2.5 V
Lead Channel Setting Pacing Pulse Width: 1 ms
Lead Channel Setting Sensing Sensitivity: 2 mV
Pulse Gen Model: 2272
Pulse Gen Serial Number: 9197143

## 2021-06-09 ENCOUNTER — Telehealth: Payer: Self-pay | Admitting: Internal Medicine

## 2021-06-09 NOTE — Telephone Encounter (Signed)
Patient calling to speak with [name] ONLY re:  - [facility] is requiring a device check prior to 1/25 MRI .  Patient wants to know if Caryl Comes thinks this is really needed and if so can we do the device check prior to mri .   - RSV .  Patient has been hearing / seeing information about the prevalence of this virus and cardiac patients being at higher risk for infection and complications . Please provide feedback.   Patient aware Heather nor Dr. Caryl Comes available in the office today . Patient acknowledges and understands he may not get a follow up call back until Thursday or Friday .

## 2021-06-10 ENCOUNTER — Ambulatory Visit: Payer: 59 | Admitting: Dermatology

## 2021-06-11 NOTE — Telephone Encounter (Signed)
I spoke with the patient. He called to discuss several issues:  1) His radiation oncologist at [facility] has him scheduled for a MRI on 07/15/21. He states [facility] called him to arrange for a device interrogation to be done with Dr. Rosalita Chessman office (whom he no longer sees) prior to the MRI. The patient called inquiring if Dr. Caryl Comes felt this was necessary as he spoke with [facility] radiology and they felt everything was in order for him. I advised the patient that I did not think it was unusual for rep to be present during a MRI to interrogate the device before and after the test was done. However, I am uncertain as to what common practice truly is or if this is different based on different health systems. The patient is aware I will forward a message to our [facility] team to weigh in and follow up with him about protocols they follow. The patient was agreeable with a call back from the [facility] team and aware it may be early next week before he receives a return call.    2) Concerns with RSV: I advised the patient that children and older adults are definitely high risk for this this year and with his various medical conditions/ age, he would fall in that category as well.  He wanted to know who he should call if he should get sick. I have advised him to first reach out to his PCP if he shows any signs of a viral illness as this may need to be ruled out as COVID vs Flu vs RSV.  They can best advise him on treatments at that time.   3) PET scan: The patient had a recent PET scan at [facility] on 06/05/21. He wanted to make Dr. Caryl Comes aware of this to review. I advised I will notify Dr. Caryl Comes this has been done so he may review.  The patient voices understanding of all of the above and is agreeable.

## 2021-06-11 NOTE — Telephone Encounter (Signed)
Spoke with patient informed him that if [facility] has scheduled the appointment and is aware he has a PPM and per patient a [facility] rep has been there before at [facility] and checked his PPM before and after the MRI. From device clinic standpoint he should not need to see Dr Caryl Comes prior and to keep his apt with Dr. Caryl Comes on 07/21/20 at 11:20AM patient voiced understanding.

## 2021-06-11 NOTE — Telephone Encounter (Signed)
Attempted to call the patient. No answer- I left a message to please call back.  

## 2021-06-17 NOTE — Progress Notes (Signed)
Remote pacemaker transmission.   

## 2021-06-25 NOTE — Telephone Encounter (Signed)
I had Dr. Caryl Comes review the patient's PET scan from [facility] from December. Dr. Caryl Comes also reviewed the note from Dr. Weyman Croon from the [facility] from yesterday.   Per Dr. Caryl Comes, it appears as though the patient may be needing a pulmonary follow up/ possible biopsy at this time based on PET findings.  Will await further follow up from [facility].  I have called and spoke with the patient and advised him that Dr. Caryl Comes has reviewed his PET from [facility] and has reviewed Dr. Fleeta Emmer note from 06/24/21. The patient is aware that Dr. Caryl Comes is up to date on his current situation. The patient was appreciative of the call back and the up date.  He had no further questions at this time.

## 2021-06-26 ENCOUNTER — Encounter: Payer: Self-pay | Admitting: Internal Medicine

## 2021-07-03 ENCOUNTER — Other Ambulatory Visit: Payer: Self-pay | Admitting: Nephrology

## 2021-07-03 ENCOUNTER — Other Ambulatory Visit: Payer: Self-pay | Admitting: Pulmonary Disease

## 2021-07-03 DIAGNOSIS — R591 Generalized enlarged lymph nodes: Secondary | ICD-10-CM

## 2021-07-18 ENCOUNTER — Ambulatory Visit: Admit: 2021-07-18 | Discharge: 2021-07-18 | Payer: MEDICARE

## 2021-07-20 ENCOUNTER — Ambulatory Visit
Admit: 2021-07-20 | Discharge: 2021-07-21 | Payer: MEDICARE | Attending: Radiation Oncology | Primary: Radiation Oncology

## 2021-07-21 ENCOUNTER — Encounter: Payer: Self-pay | Admitting: Internal Medicine

## 2021-07-21 ENCOUNTER — Other Ambulatory Visit: Payer: Self-pay

## 2021-07-21 ENCOUNTER — Ambulatory Visit (INDEPENDENT_AMBULATORY_CARE_PROVIDER_SITE_OTHER): Payer: 59 | Admitting: Internal Medicine

## 2021-07-21 VITALS — BP 118/68 | HR 80 | Ht 69.0 in | Wt 177.0 lb

## 2021-07-21 DIAGNOSIS — R600 Localized edema: Secondary | ICD-10-CM

## 2021-07-21 DIAGNOSIS — I4891 Unspecified atrial fibrillation: Secondary | ICD-10-CM | POA: Diagnosis not present

## 2021-07-21 DIAGNOSIS — I442 Atrioventricular block, complete: Secondary | ICD-10-CM

## 2021-07-21 LAB — PACEMAKER DEVICE OBSERVATION

## 2021-07-21 MED ORDER — ATORVASTATIN CALCIUM 40 MG PO TABS: 40.0000 mg | ORAL_TABLET | Freq: Every day | ORAL | 3 refills | Status: DC

## 2021-07-21 NOTE — Progress Notes (Signed)
ELECTROPHYSIOLOGY OFFICE NOTE  Patient ID: Devin Hale, MRN: [id number], DOB/AGE: 1951-02-22 71 y.o. Admit date: (Not on file) Date of Consult: 07/21/2021  Primary Physician: Marguerita Merles, MD Primary Cardiologist:  [facility]*     HPI Devin Hale is a 71 y.o. male presents for pacemaker-His bundle Abbott follow-up--device implanted at [facility] 2/21   Implantation St Jude complicated by atrial lead dislodgment as well as pericarditis for which he received colchicine for a month.  Also developed postprocedural atrial fibrillation and started on Eliquis  Evaluation also demonstrated unipolar pacing and pocket stimulation during backup pulsing.  Programmed to bipolar pacing  There has been an issue of possible sarcoid based on cMRI imaging.  Discussions with [facility] and [facility], sarcoid suggested* cMRI;  PET scan [facility] 10/22 "septal... Basal anterior and basal inferior with abnormal metabolism ... Nonspecific and could represent sarcoid or other inflammatory process "    also bilateral hilar and mediastinal lymph nodes"  Seen in consultation by Dr. Weyman Croon at [facility] 1/23.  Notes include "whole body PET No hypermetabolic hilar lymph nodes And no evidence of pulmonary sarcoid.  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  He had "low suspicion for cardiac sarcoid "thought more likely that this represented a systemic myositis  Rheum etiology consultation also 1/23 suggested "some other form of inflammatory myopathy "  Also has hepatitis C-treated complicated by cirrhosis and interval development of hepatic cancer treated by ablation.  Increasingly weak.  No chest pain.  Mild shortness of breath.  No edema.  No lightheadedness.  DATE TEST EF   2/21 [facility]    % Nonobstructive Cx/RCA  4/21 Echo  55-65% Small mod  pericardial effusion  9/21 cMRI  LGE in noncardiac distribution suggestive of sarcoid   Date Cr K Hgb  4/21 0.89 4.5 14.5  10/21 0.99 4.0 14.4    Past medical history notable for chronic hepatitis C followed at [facility].  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  Past Medical History:  Diagnosis Date   Complete heart block ([place])    Hepatitis    Hepatitis C    Pacemaker       Surgical History:  Past Surgical History:  Procedure Laterality Date   COLONOSCOPY WITH PROPOFOL N/A 05/27/2020   Procedure: COLONOSCOPY WITH PROPOFOL;  Surgeon: Lucilla Lame, MD;  Location: [facility] ENDOSCOPY;  Service: Endoscopy;  Laterality: N/A;   ESOPHAGOGASTRODUODENOSCOPY (EGD) WITH PROPOFOL N/A 05/27/2020   Procedure: ESOPHAGOGASTRODUODENOSCOPY (EGD) WITH PROPOFOL;  Surgeon: Lucilla Lame, MD;  Location: [facility] ENDOSCOPY;  Service: Endoscopy;  Laterality: N/A;   KNEE ARTHROSCOPY Left 1982   LIVER BIOPSY     NASAL SINUS SURGERY  2019   TEMPORARY PACEMAKER N/A 08/13/2019   Procedure: TEMPORARY PACEMAKER;  Surgeon: [name], MD;  Location: [place] CV LAB;  Service: Cardiovascular;  Laterality: N/A;   TONSILLECTOMY       Home Meds: Current Meds  Medication Sig   ascorbic acid (VITAMIN C) 500 MG tablet Take 500 mg by mouth 2 (two) times daily.    aspirin EC 81 MG tablet Take 1 tablet (81 mg total) by mouth daily. Swallow whole.   atorvastatin (LIPITOR) 40 MG tablet Take 1 tablet  by mouth once daily   cholecalciferol (VITAMIN D3) 25 MCG (1000 UNIT) tablet Take 1,000 Units by mouth in the morning and at bedtime.    Coenzyme Q10 (CO Q 10) 100 MG CAPS Take 100 mg by mouth daily.   denosumab (PROLIA) 60 MG/ML SOSY injection Inject into the skin.   magnesium oxide (MAG-OX) 400 MG tablet Take 200 mg by mouth 2 (two) times daily.   Multiple  Vitamin (MULTIVITAMIN WITH MINERALS) TABS tablet Take 1 tablet by mouth daily.   Omega-3 Fatty Acids (FISH OIL) 1000 MG CAPS Take 2 capsules by mouth daily.   predniSONE (DELTASONE) 10 MG tablet Take 10 mg by mouth daily.   vitamin E 200 UNIT capsule Take 200 Units by mouth daily.   Vitamins-Lipotropics (COMPLEX B-100-INOSITOL) TBCR Take by mouth.   Whey Protein POWD Take by mouth daily.    Allergies: No Known Allergies      ROS:  Please see the history of present illness.     All other systems reviewed and negative.    Physical Exam: Blood pressure 118/68, pulse 80, height 5\' 9"  (1.753 m), weight 177 lb (80.3 kg), SpO2 95 %. Well developed and well nourished in no acute distress HENT normal Neck supple with JVP-flat Clear Device pocket well healed; without hematoma or erythema.  There is no tethering  Regular rate and rhythm, no  murmur Abd-soft with active BS No Clubbing cyanosis  edema Skin-warm and dry A & Oriented  Grossly normal sensory and motor function significant muscle atrophy in his hands  ECG sinus with P synchronous pacing at 80 Intervals 16/16/41    Labs: Cardiac Enzymes No results for input(s): CKTOTAL, CKMB, TROPONINI in the last 72 hours. CBC Lab Results  Component Value Date   WBC 7.0 04/07/2020   HGB 14.4 04/07/2020   HCT 41.7 04/07/2020   MCV 85.6 04/07/2020   PLT 104 (L) 04/07/2020   PROTIME: No results for input(s): LABPROT, INR in the last 72 hours. Chemistry No results for input(s): NA, K, CL, CO2, BUN, CREATININE, CALCIUM, PROT, BILITOT, ALKPHOS, ALT, AST, GLUCOSE in the last 168 hours.  Invalid input(s): LABALBU Lipids No results found for: CHOL, HDL, LDLCALC, TRIG BNP No results found for: PROBNP Thyroid Function Tests: No results for input(s): TSH, T4TOTAL, T3FREE, THYROIDAB in the last 72 hours.  Invalid input(s): FREET3 Miscellaneous No results found for: DDIMER  Radiology/Studies:  No results found.   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  Grateful that interval imaging of his liver showed no new cancer lesions  Has nonobstructive coronary disease.  On aspirin 81 and atorvastatin 40 with an LDL of 75, close enough to target I think given all the other comorbidities.  Discussion again about the frustrations and challenges of the last years and his healthcare       Virl Axe

## 2021-07-21 NOTE — Patient Instructions (Signed)

## 2021-08-03 DIAGNOSIS — Z1289 Encounter for screening for malignant neoplasm of other sites: Principal | ICD-10-CM

## 2021-08-03 DIAGNOSIS — C220 Liver cell carcinoma: Principal | ICD-10-CM

## 2021-08-03 DIAGNOSIS — K7460 Unspecified cirrhosis of liver: Principal | ICD-10-CM

## 2021-08-03 DIAGNOSIS — B182 Chronic viral hepatitis C: Principal | ICD-10-CM

## 2021-08-21 ENCOUNTER — Telehealth: Payer: Self-pay | Admitting: Internal Medicine

## 2021-08-21 NOTE — Telephone Encounter (Signed)
Patient calling to discuss recent cardiac mri done at [place] last week 08-12-21 .  ? ?Please call to discuss that we received results and what Dr. Caryl Comes thinks of them . ? ? ?Patient aware nurse will have to locate results and review with Dr. Caryl Comes and call back with poc may be next week.  ? ?Patient understands states not urgent and will look forward to feedback from Dr. Caryl Comes next week.  ?

## 2021-08-21 NOTE — Telephone Encounter (Signed)
Reviewed the patient's chart- Cardiac MRI done at [facility] on 08/12/21. ?Located in [place]. ? ?Forwarding to Dr. Caryl Comes to please review.  ? ? ?

## 2021-09-01 NOTE — Telephone Encounter (Signed)
Dr. Caryl Comes is aware that the patient has had the Cardiac MRI repeated at [facility] and will be looking at this.  ? ?I have called and notified the patient of the above. ?He is aware myself/ Dr. Caryl Comes will call him back once this is reviewed. ? ?The patient was very appreciative of the call back and voiced understanding.  ? ?

## 2021-09-01 NOTE — Telephone Encounter (Signed)
Patient calling to check on status.

## 2021-09-04 ENCOUNTER — Ambulatory Visit (INDEPENDENT_AMBULATORY_CARE_PROVIDER_SITE_OTHER): Payer: 59

## 2021-09-04 DIAGNOSIS — I442 Atrioventricular block, complete: Secondary | ICD-10-CM | POA: Diagnosis not present

## 2021-09-04 LAB — CUP PACEART REMOTE DEVICE CHECK
Battery Remaining Longevity: 52 mo
Battery Remaining Percentage: 74 %
Battery Voltage: 2.99 V
Brady Statistic AP VP Percent: 32 %
Brady Statistic AP VS Percent: 1 %
Brady Statistic AS VP Percent: 66 %
Brady Statistic AS VS Percent: 1 %
Brady Statistic RA Percent Paced: 30 %
Brady Statistic RV Percent Paced: 98 %
Date Time Interrogation Session: 20230317020013
Implantable Lead Implant Date: 20210224
Implantable Lead Implant Date: 20210224
Implantable Lead Location: 753859
Implantable Lead Location: 753860
Implantable Lead Model: 7842
Implantable Lead Serial Number: 1032895
Implantable Pulse Generator Implant Date: 20210224
Lead Channel Impedance Value: 380 Ohm
Lead Channel Impedance Value: 550 Ohm
Lead Channel Pacing Threshold Amplitude: 0.875 V
Lead Channel Pacing Threshold Amplitude: 1.5 V
Lead Channel Pacing Threshold Pulse Width: 1 ms
Lead Channel Pacing Threshold Pulse Width: 1 ms
Lead Channel Sensing Intrinsic Amplitude: 2.8 mV
Lead Channel Sensing Intrinsic Amplitude: 5.9 mV
Lead Channel Setting Pacing Amplitude: 1.125
Lead Channel Setting Pacing Amplitude: 3 V
Lead Channel Setting Pacing Pulse Width: 1 ms
Lead Channel Setting Sensing Sensitivity: 2 mV
Pulse Gen Model: 2272
Pulse Gen Serial Number: 9197143

## 2021-09-07 ENCOUNTER — Ambulatory Visit: Payer: 59

## 2021-09-10 NOTE — Progress Notes (Signed)
Remote pacemaker transmission.   

## 2021-09-15 NOTE — Telephone Encounter (Signed)
I spoke with the patient. ?He advised he received my message on behalf of Dr. Caryl Comes and voices understanding. ?He states he is in favor of receiving input from Dr. Burt Knack, however, he is scheduled for another CT scan on 10/05/21 at [facility] per Dr. Deatra Canter ([facility]) to follow up in some abnormal lymph nodes. ? ?The patient would like to defer on involving Dr. Burt Knack until after his 10/05/21 scan. ?He advised he will touch base after that. ? ?The patient is aware I will follow up on his 10/05/21 after it is completed to try to have Dr. Caryl Comes review as well. ? ?The patient voices understanding and is agreeable. ? ? ?

## 2021-09-15 NOTE — Telephone Encounter (Signed)
Attempted to call the patient. ?No answer- I left a detailed message of Dr. Olin Pia comments as stated below (ok per DPR). ? ?I have advised him that he may call back to discuss at his convenience.  ? ? ?

## 2021-09-15 NOTE — Telephone Encounter (Signed)
Have reviewed the interpretation of the scan and Dr Fleeta Emmer assessment.  I dont have enough expertise to interpret it differently.  If he would like, and can get a copy of the disc, I am glad to see if we could get him to Dr Joneen Caraway at [facility] to review it and the abnormal PET for their expertise ?Heather ?Thanks SK   ?

## 2021-09-26 DIAGNOSIS — M810 Age-related osteoporosis without current pathological fracture: Principal | ICD-10-CM

## 2021-09-30 ENCOUNTER — Ambulatory Visit: Admit: 2021-09-30 | Discharge: 2021-09-30 | Payer: MEDICARE | Attending: "Endocrinology | Primary: "Endocrinology

## 2021-09-30 ENCOUNTER — Institutional Professional Consult (permissible substitution): Admit: 2021-09-30 | Discharge: 2021-09-30 | Payer: MEDICARE

## 2021-09-30 DIAGNOSIS — K7460 Unspecified cirrhosis of liver: Principal | ICD-10-CM

## 2021-09-30 DIAGNOSIS — M810 Age-related osteoporosis without current pathological fracture: Principal | ICD-10-CM

## 2021-09-30 DIAGNOSIS — B182 Chronic viral hepatitis C: Principal | ICD-10-CM

## 2021-09-30 DIAGNOSIS — C220 Liver cell carcinoma: Principal | ICD-10-CM

## 2021-10-02 ENCOUNTER — Other Ambulatory Visit: Payer: Self-pay

## 2021-10-05 ENCOUNTER — Ambulatory Visit
Admission: RE | Admit: 2021-10-05 | Discharge: 2021-10-05 | Disposition: A | Discharge status: Home / Self Care | Payer: 59 | Source: Ambulatory Visit | Attending: Pulmonary Disease | Admitting: Pulmonary Disease

## 2021-10-05 ENCOUNTER — Other Ambulatory Visit: Payer: Self-pay

## 2021-10-05 DIAGNOSIS — R591 Generalized enlarged lymph nodes: Secondary | ICD-10-CM

## 2021-10-05 LAB — POCT I-STAT CREATININE: Creatinine, Ser: 0.9 mg/dL (ref 0.61–1.24)

## 2021-10-05 MED ORDER — IOHEXOL 300 MG/ML  SOLN
75.0000 mL | Freq: Once | INTRAMUSCULAR | Status: AC | PRN
  Administered 2021-10-05: 75 mL via INTRAVENOUS

## 2021-10-19 ENCOUNTER — Telehealth: Payer: Self-pay | Admitting: Gastroenterology

## 2021-10-19 NOTE — Telephone Encounter (Signed)
Patient left vm requesting a call back in reference to a referral to [facility]. States he wants to come up with a plan B.  ?

## 2021-10-26 DIAGNOSIS — B182 Chronic viral hepatitis C: Principal | ICD-10-CM

## 2021-10-26 DIAGNOSIS — C220 Liver cell carcinoma: Principal | ICD-10-CM

## 2021-10-26 DIAGNOSIS — K7460 Unspecified cirrhosis of liver: Principal | ICD-10-CM

## 2021-10-26 DIAGNOSIS — Z1289 Encounter for screening for malignant neoplasm of other sites: Principal | ICD-10-CM

## 2021-10-26 NOTE — Telephone Encounter (Signed)
Spoke with pt regarding his referral to [facility]. Pt only wanted to inform me he had an appt.  ?

## 2021-11-12 ENCOUNTER — Telehealth: Payer: Self-pay | Admitting: Internal Medicine

## 2021-11-12 NOTE — Telephone Encounter (Signed)
  Pt requesting to speak with RN [name]

## 2021-11-12 NOTE — Telephone Encounter (Signed)
I called and spoke with the patient.  He advised that he saw his neuromuscular team at [facility] recently and they feel he be having some side effects from atorvastatin. I asked the patient to describe what he is feeling and he advised "I feel like poop." He is having joint/ muscle aches.  He wanted to discuss with Dr. Caryl Comes the possibility of coming off her atorvastatin or decreasing the dose.  I have advised the patient that Dr. Caryl Comes will typically just stop the statin for 1-2 weeks to see if symptoms improve. If they improve, a discussion can be had as to next steps for statin therapy. If symptoms do not improve, then they are most likely not related to the drug and he could resume it.    I have advised the patient to hold atorvastatin for 1-2 weeks, then let us know how he is feeling one way or the other.   He voices understanding and is agreeable.  He also wanted to let Dr. Caryl Comes know that he met with the sarcoid team at [facility] and they are confident he does not have sarcoid, but does have some moderate inflammation to his lymph nodes.  He had a CT scan at [facility] last week per his [place] that should be available for review.  The patient was advised I will forward a message to Dr. Caryl Comes as an Juluis Rainier.   He was very appreciative of the call back.

## 2021-11-17 ENCOUNTER — Ambulatory Visit: Admit: 2021-11-17 | Discharge: 2021-11-18 | Payer: MEDICARE

## 2021-11-17 DIAGNOSIS — H73892 Other specified disorders of tympanic membrane, left ear: Principal | ICD-10-CM

## 2021-11-17 DIAGNOSIS — J310 Chronic rhinitis: Principal | ICD-10-CM

## 2021-11-17 DIAGNOSIS — J309 Allergic rhinitis, unspecified: Principal | ICD-10-CM

## 2021-11-17 DIAGNOSIS — H903 Sensorineural hearing loss, bilateral: Principal | ICD-10-CM

## 2021-11-17 DIAGNOSIS — H6982 Other specified disorders of Eustachian tube, left ear: Principal | ICD-10-CM

## 2021-11-17 DIAGNOSIS — C220 Liver cell carcinoma: Principal | ICD-10-CM

## 2021-11-17 MED ORDER — FLUTICASONE PROPIONATE 50 MCG/ACTUATION NASAL SPRAY,SUSPENSION
Freq: Two times a day (BID) | NASAL | 11 refills | 60 days | Status: CP
Start: 2021-11-17 — End: ?

## 2021-11-17 NOTE — Telephone Encounter (Signed)
Noted  

## 2021-11-19 ENCOUNTER — Encounter: Payer: Self-pay | Admitting: Internal Medicine

## 2021-11-23 ENCOUNTER — Ambulatory Visit
Admit: 2021-11-23 | Discharge: 2021-11-24 | Payer: MEDICARE | Attending: Radiation Oncology | Primary: Radiation Oncology

## 2021-11-30 ENCOUNTER — Telehealth: Payer: Self-pay | Admitting: Internal Medicine

## 2021-11-30 NOTE — Telephone Encounter (Signed)
Patient is calling to to talk with Dr. Caryl Comes or nurse. Please call back

## 2021-12-01 ENCOUNTER — Encounter: Payer: Self-pay | Admitting: Internal Medicine

## 2021-12-01 ENCOUNTER — Telehealth (INDEPENDENT_AMBULATORY_CARE_PROVIDER_SITE_OTHER): Payer: 59 | Admitting: Internal Medicine

## 2021-12-01 ENCOUNTER — Telehealth: Payer: Self-pay | Admitting: Internal Medicine

## 2021-12-01 VITALS — Ht 69.0 in

## 2021-12-01 DIAGNOSIS — I4891 Unspecified atrial fibrillation: Secondary | ICD-10-CM

## 2021-12-01 DIAGNOSIS — I442 Atrioventricular block, complete: Secondary | ICD-10-CM | POA: Diagnosis not present

## 2021-12-01 DIAGNOSIS — Z950 Presence of cardiac pacemaker: Secondary | ICD-10-CM | POA: Diagnosis not present

## 2021-12-01 NOTE — Progress Notes (Signed)
Electrophysiology TeleHealth Note   Due to national recommendations of social distancing due to COVID 19, an audio/video telehealth visit is felt to be most appropriate for this patient at this time.  See MyChart message from today for the patient's consent to telehealth for [facility].   Date:  12/01/2021   ID:  Devin Hale, DOB August 16, 1950, MRN [id number]  Location: patient's home  Provider location: [address], [place]  Evaluation Performed: Follow-up visit  PCP:  Marguerita Merles, MD  Cardiologist:     Electrophysiologist:  SK   Chief Complaint:     History of Present Illness:    Devin Hale is a 71 y.o. male who presents via audio/video conferencing for a telehealth visit today.  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  He is stopped his atorvastatin because of concerns that it might be contributing to his myopathy and myositis.  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    Past Medical History:  Diagnosis Date   Complete heart block ([place])    Hepatitis    Hepatitis C    Pacemaker     Past Surgical History:  Procedure Laterality Date   COLONOSCOPY WITH PROPOFOL N/A 05/27/2020   Procedure: COLONOSCOPY WITH PROPOFOL;  Surgeon: Lucilla Lame, MD;  Location: [facility] ENDOSCOPY;  Service: Endoscopy;  Laterality: N/A;   ESOPHAGOGASTRODUODENOSCOPY (EGD) WITH PROPOFOL N/A 05/27/2020   Procedure: ESOPHAGOGASTRODUODENOSCOPY (EGD) WITH PROPOFOL;  Surgeon: Lucilla Lame, MD;  Location: [facility] ENDOSCOPY;  Service: Endoscopy;  Laterality: N/A;   KNEE ARTHROSCOPY Left 1982   LIVER BIOPSY     NASAL SINUS SURGERY  2019   TEMPORARY PACEMAKER N/A 08/13/2019   Procedure: TEMPORARY PACEMAKER;  Surgeon: [name], MD;  Location: [place] CV LAB;  Service: Cardiovascular;  Laterality: N/A;   TONSILLECTOMY      Current Outpatient Medications  Medication Sig Dispense Refill   ascorbic acid (VITAMIN C) 500 MG tablet Take 500 mg by mouth 2 (  two) times daily.      aspirin EC 81 MG tablet Take 1 tablet (81 mg total) by mouth daily. Swallow whole.     atorvastatin (LIPITOR) 40 MG tablet Take 1 tablet (40 mg total) by mouth daily. 90 tablet 3   cholecalciferol (VITAMIN D3) 25 MCG (1000 UNIT) tablet Take 1,000 Units by mouth in the morning and at bedtime.      Coenzyme Q10 (CO Q 10) 100 MG CAPS Take 100 mg by mouth daily.     denosumab (PROLIA) 60 MG/ML SOSY injection Inject into the skin.     magnesium oxide (MAG-OX) 400 MG tablet Take 200 mg by mouth 2 (two) times daily.     Multiple Vitamin (MULTIVITAMIN WITH MINERALS) TABS tablet Take 1 tablet by mouth daily.     Omega-3 Fatty Acids (FISH OIL) 1000 MG CAPS Take 2 capsules by mouth daily.     predniSONE (DELTASONE) 10 MG tablet Take 10 mg by mouth daily.     vitamin E 200 UNIT capsule Take 200 Units by mouth daily.     Vitamins-Lipotropics (COMPLEX  B-100-INOSITOL) TBCR Take by mouth.     Whey Protein POWD Take by mouth daily.     No current facility-administered medications for this visit.    Allergies:   Patient has no known allergies.   Social History:  The patient  reports that he has never smoked. He has never used smokeless tobacco. He reports that he does not drink alcohol and does not use drugs.   Family History:  The patient's   family history includes Heart attack in his father; Non-Hodgkin's lymphoma in his mother.   ROS:  Please see the history of present illness.   All other systems are personally reviewed and negative.    Exam:    Vital Signs:  Ht '5\' 9"'$  (1.753 m)   BMI 26.14 kg/m     Well appearing, alert and conversant, regular work of breathing,  good skin color Eyes- anicteric, neuro- grossly intact, skin- no apparent rash or lesions or cyanosis, mouth- oral mucosa is pink   Labs/Other Tests and Data Reviewed:    Recent Labs: 10/05/2021: Creatinine, Ser 0.90   Wt Readings from Last 3 Encounters:  07/21/21 177 lb (80.3 kg)  04/14/21 172 lb (78 kg)  01/29/21 172 lb (78 kg)     Other studies personally reviewed: Additional studies/ records that were reviewed today include: records from [place] of the above records today demonstrates:   Prior radiographs:    ASSESSMENT & PLAN:    Complete heart block-intermittent   Pacemaker-Saint Jude with a [employer] His lead   Pericarditis post atrial lead microperforation/dislodgment   Atrial fibrillation postprocedure   Coronary artery disease non-obstructive   Hepatitis C   Neuromuscular issue currently undiagnosed   Asymmetric edema  Have suggested that he stay off the atorvastatin for another couple of months to see if there is any impact.  The time domains described mostly for myalgias and the question here is I think more complicated as relates to his myositis.  He is having more dyspnea.  He had asked for a cardiopulmonary stress  test at [facility]; not heard back yet I think this is a reasonable idea.  We can arrange in [place].  Given his constellation of symptoms, I have again suggested that a comprehensive evaluation perhaps at [facility] might be helpful trying to get insights as to if and how these various complaints relate.  COVID 19 screen The patient denies symptoms of COVID 19 at this time.  The importance of social distancing was discussed today.  Follow-up:  22m   Current medicines are reviewed at length with the patient today.   The patient  concerns regarding his medicines.  The following changes were made today:    Labs/ tests ordered today include: Cardiopulmonary stress test No orders of the defined types were placed in this encounter.   Future tests ( post COVID )   in  months  Patient Risk:  after full review of this patients clinical status, I feel that they are at moderate risk at this time.  Today, I have spent 21 minutes with the patient with telehealth technology discussing the above.  Signed, SVirl Axe MD  12/01/2021 5:34 PM     [place] [phone]) ([phone])

## 2021-12-01 NOTE — Telephone Encounter (Signed)
  Patient Consent for Virtual Visit        Devin Hale has provided verbal consent on 12/01/2021 for a virtual visit (video or telephone).   CONSENT FOR VIRTUAL VISIT FOR:  Devin Hale  By participating in this virtual visit I agree to the following:  I hereby voluntarily request, consent and authorize [facility] and its employed or contracted physicians, physician assistants, nurse practitioners or other licensed health care professionals (the Practitioner), to provide me with telemedicine health care services (the "Services") as deemed necessary by the treating Practitioner. I acknowledge and consent to receive the Services by the Practitioner via telemedicine. I understand that the telemedicine visit will involve communicating with the Practitioner through live audiovisual communication technology and the disclosure of certain medical information by electronic transmission. I acknowledge that I have been given the opportunity to request an in-person assessment or other available alternative prior to the telemedicine visit and am voluntarily participating in the telemedicine visit.  I understand that I have the right to withhold or withdraw my consent to the use of telemedicine in the course of my care at any time, without affecting my right to future care or treatment, and that the Practitioner or I may terminate the telemedicine visit at any time. I understand that I have the right to inspect all information obtained and/or recorded in the course of the telemedicine visit and may receive copies of available information for a reasonable fee.  I understand that some of the potential risks of receiving the Services via telemedicine include:  Delay or interruption in medical evaluation due to technological equipment failure or disruption; Information transmitted may not be sufficient (e.g. poor resolution of images) to allow for appropriate medical decision making by the Practitioner; and/or  In  rare instances, security protocols could fail, causing a breach of personal health information.  Furthermore, I acknowledge that it is my responsibility to provide information about my medical history, conditions and care that is complete and accurate to the best of my ability. I acknowledge that Practitioner's advice, recommendations, and/or decision may be based on factors not within their control, such as incomplete or inaccurate data provided by me or distortions of diagnostic images or specimens that may result from electronic transmissions. I understand that the practice of medicine is not an exact science and that Practitioner makes no warranties or guarantees regarding treatment outcomes. I acknowledge that a copy of this consent can be made available to me via my patient portal ([place]), or I can request a printed copy by calling the office of [facility].    I understand that my insurance will be billed for this visit.   I have read or had this consent read to me. I understand the contents of this consent, which adequately explains the benefits and risks of the Services being provided via telemedicine.  I have been provided ample opportunity to ask questions regarding this consent and the Services and have had my questions answered to my satisfaction. I give my informed consent for the services to be provided through the use of telemedicine in my medical care

## 2021-12-01 NOTE — Patient Instructions (Addendum)
Medication Instructions:  Your physician has recommended you make the following change in your medication:   ** Hold your Atorvastatin x 2 months   *If you need a refill on your cardiac medications before your next appointment, please call your pharmacy*   Lab Work: None ordered.  If you have labs (blood work) drawn today and your tests are completely normal, you will receive your results only by: [place] (if you have MyChart) OR A paper copy in the mail If you have any lab test that is abnormal or we need to change your treatment, we will call you to review the results.   Testing/Procedures: None ordered.    Follow-Up: At [facility], you and your health needs are our priority.  As part of our continuing mission to provide you with exceptional heart care, we have created designated Provider Care Teams.  These Care Teams include your primary Cardiologist (physician) and Advanced Practice Providers (APPs -  Physician Assistants and Nurse Practitioners) who all work together to provide you with the care you need, when you need it.  We recommend signing up for the patient portal called "MyChart".  Sign up information is provided on this After Visit Summary.  MyChart is used to connect with patients for Virtual Visits (Telemedicine).  Patients are able to view lab/test results, encounter notes, upcoming appointments, etc.  Non-urgent messages can be sent to your provider as well.   To learn more about what you can do with MyChart, go to [email or web address].    Your next appointment:   03/25/2022 at 9:40am with Dr Caryl Comes at his [facility] office  Important Information About [place]

## 2021-12-01 NOTE — Telephone Encounter (Signed)
Spoke w/ pt.  He reports that he is making a f/u phone call about a previous call w/ Heather about his atorvastatin.  He would also like to speak w/ Dr. Caryl Comes about an "ongoing issues". Attempted to gather more info, but pt was unhappy w/ new calling system and his inability to reach [facility] directly. Asked if he has MyChart,  to which he responded "I know all about MyChart, I don't need a lecture on it" and then hung up.

## 2021-12-01 NOTE — Telephone Encounter (Signed)
Reached out to patient to fill open slot with Dr. Caryl Comes    Patient aware [name] RN not in the office this week and is wanting to speak with Dr. Caryl Comes about medication.    [place] - FYI Patient scheduled virtual visit 6/13 at 2 pm .

## 2021-12-04 ENCOUNTER — Ambulatory Visit (INDEPENDENT_AMBULATORY_CARE_PROVIDER_SITE_OTHER): Payer: 59

## 2021-12-04 DIAGNOSIS — I442 Atrioventricular block, complete: Secondary | ICD-10-CM | POA: Diagnosis not present

## 2021-12-07 NOTE — Telephone Encounter (Signed)
Reviewed the patient's chart. He did have a virtual visit with Dr. Caryl Comes on 12/01/21.

## 2021-12-08 ENCOUNTER — Telehealth: Payer: Self-pay | Admitting: Internal Medicine

## 2021-12-08 LAB — CUP PACEART REMOTE DEVICE CHECK
Battery Remaining Longevity: 50 mo
Battery Remaining Percentage: 70 %
Battery Voltage: 2.98 V
Brady Statistic AP VP Percent: 32 %
Brady Statistic AP VS Percent: 1 %
Brady Statistic AS VP Percent: 66 %
Brady Statistic AS VS Percent: 1 %
Brady Statistic RA Percent Paced: 31 %
Brady Statistic RV Percent Paced: 99 %
Date Time Interrogation Session: 20230619175739
Implantable Lead Implant Date: 20210224
Implantable Lead Implant Date: 20210224
Implantable Lead Location: 753859
Implantable Lead Location: 753860
Implantable Lead Model: 7842
Implantable Lead Serial Number: 1032895
Implantable Pulse Generator Implant Date: 20210224
Lead Channel Impedance Value: 400 Ohm
Lead Channel Impedance Value: 550 Ohm
Lead Channel Pacing Threshold Amplitude: 0.75 V
Lead Channel Pacing Threshold Amplitude: 1.75 V
Lead Channel Pacing Threshold Pulse Width: 1 ms
Lead Channel Pacing Threshold Pulse Width: 1 ms
Lead Channel Sensing Intrinsic Amplitude: 2.8 mV
Lead Channel Sensing Intrinsic Amplitude: 5.2 mV
Lead Channel Setting Pacing Amplitude: 1 V
Lead Channel Setting Pacing Amplitude: 3 V
Lead Channel Setting Pacing Pulse Width: 1 ms
Lead Channel Setting Sensing Sensitivity: 2 mV
Pulse Gen Model: 2272
Pulse Gen Serial Number: 9197143

## 2021-12-08 NOTE — Telephone Encounter (Signed)
Left message for patient to return call in the morning, provided office number for callback.

## 2021-12-08 NOTE — Telephone Encounter (Signed)
Patient states he was calling back to follow up with the nurse. Please advise

## 2021-12-09 ENCOUNTER — Telehealth: Payer: Self-pay | Admitting: Internal Medicine

## 2021-12-09 DIAGNOSIS — R0609 Other forms of dyspnea: Secondary | ICD-10-CM

## 2021-12-09 NOTE — Telephone Encounter (Signed)
Attempted to call the patient back. No answer- I left a message stating I will be leaving the office at noon today, so if I do not hear from him prior to that, I will call him back tomorrow.

## 2021-12-09 NOTE — Telephone Encounter (Signed)
New Message:     Patient wants [name] to call him. He states he does not want to talk to anybody but Heather. He says this is concerning a conversation they had the other week.

## 2021-12-10 NOTE — Telephone Encounter (Signed)
I spoke with the patient. He states that his power is currently out for the 2nd time in 36 hours, but wanted to follow up on a few things:  1) He received a call from the [facility] team that his transmission from 12/04/21 did not go through - Again his power is currently out, but not sure if there is also an issue with his transmitter box - Will forward to [facility] team to follow up with the patient.   2) He wanted to clarify that Dr. Caryl Comes wanted him to stay off atorvastatin. - I advised the patient that per Dr. Olin Pia virtual visit note from 12/01/21, the recommendation was to continue to hold atorvastatin x 2 months to see if there is any further impact on his symptoms.    3) He wanted to know if Dr. Caryl Comes had reached out to Dr. Sampson Si at [facility] and this was part of their discussion last week. - I advised the patient that since I was not in the office last week during his visit, I was not aware this was part of their conversation. - The patient is aware I will follow up with Dr. Caryl Comes next week when he returns to the clinic.   4) He advised that he and Dr. Caryl Comes has discussed proceeding with CPX testing, but he had not heard from scheduling about an appointment. - Patient advised in reviewing his note from 6/13 with Dr. Caryl Comes, the following was said: He is having more dyspnea.  He had asked for a cardiopulmonary stress test at [facility]; not heard back yet I think this is a reasonable idea.  We can arrange in [place] - I explained to the patient that I was not sure if Dr. Caryl Comes did not mention this to the nurse that was covering him that day or what occurred, however, I will be glad to go ahead an place the CPX order for him today to have this done in [place].  - He is advised that he will need to call [phone] (press option for scheduling) to schedule   The patient voices understanding of all of the above and is agreeable. CPX order placed.   I will mail a  copy of CPX instructions to the patient. He did voice frustrations of the current call center situation and that it is harder to reach me. I did encourage the patient to please utilize his MyChart, however, he advised that he does not have a computer and internet service where he is is not great, he is waiting for another line to be run to the area where he lives.   He was appreciative for the call back today.

## 2021-12-10 NOTE — Telephone Encounter (Signed)
See above phone note - patient spoke with Alvis Lemmings, RN.

## 2021-12-14 ENCOUNTER — Telehealth: Payer: Self-pay | Admitting: Internal Medicine

## 2021-12-14 IMAGING — CT NM PET TUM IMG INITIAL (PI) SKULL BASE T - THIGH
9 series · 24 of 25 positions shown · non-contrast
Comparison: Abdomen pelvis CT 04/07/2020.

CLINICAL DATA: Initial treatment strategy for sigmoid colon lesion.

EXAM:
NUCLEAR MEDICINE PET SKULL BASE TO THIGH
TECHNIQUE: 9.1 mCi F-18 FDG was injected intravenously. Full-ring PET imaging
was performed from the skull base to thigh after the radiotracer. CT
data was obtained and used for attenuation correction and anatomic
localization.
Fasting blood glucose: 87 mg/dl

[Series 3: ct wb 5.0 b30f · axial · 5.0mm · 0.98mm/px · z∈[-1258,-390]mm · 3 of 290 slices shown]
[im 1/290]
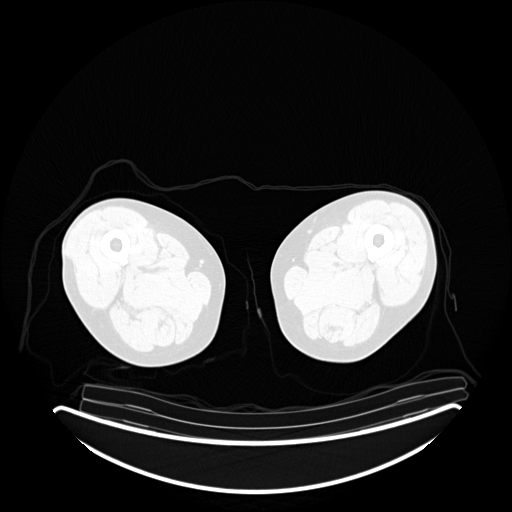
[im 145/290]
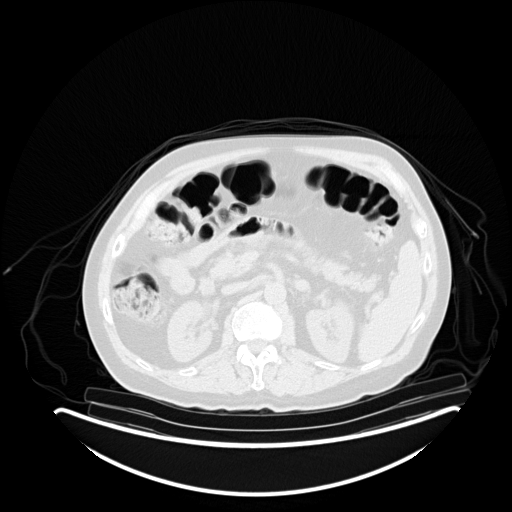
[im 290/290]
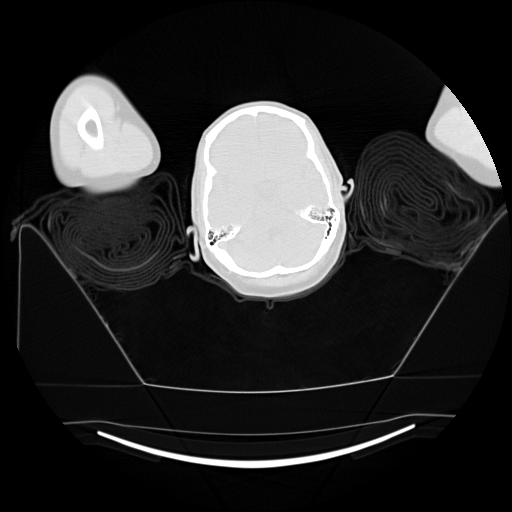

[Series 5: pet wb uncorrected (nac) · axial · 5.0mm · 4.07mm/px · z∈[-1258,-390]mm · 4 of 287 slices shown]
[im 1/287]
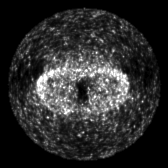
[im 96/287]
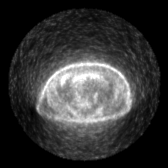
[im 191/287]
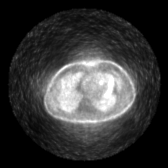
[im 287/287]
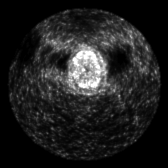

[Series 6: pet wb (ac) · axial · 5.0mm · 3.13mm/px · z∈[-1258,-390]mm · 4 of 286 slices shown]
[im 1/286]
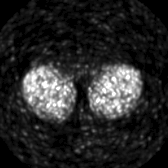
[im 96/286]
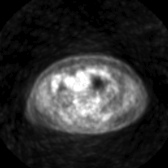
[im 191/286]
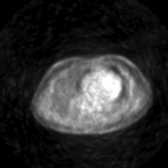
[im 286/286]
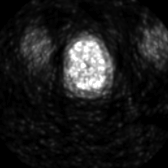

[Series 603: fused axial · 3 of 286 slices shown]
[im 1/286]
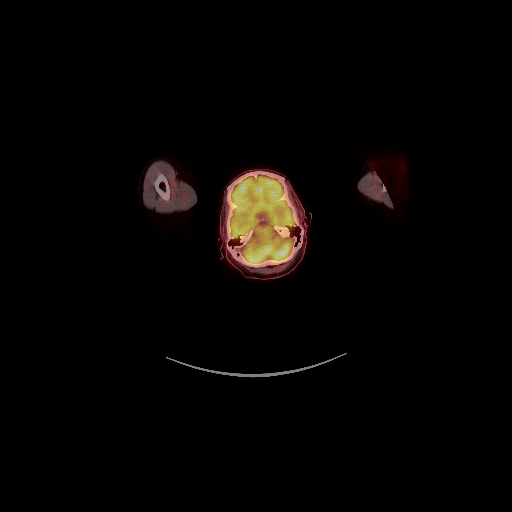
[im 191/286]
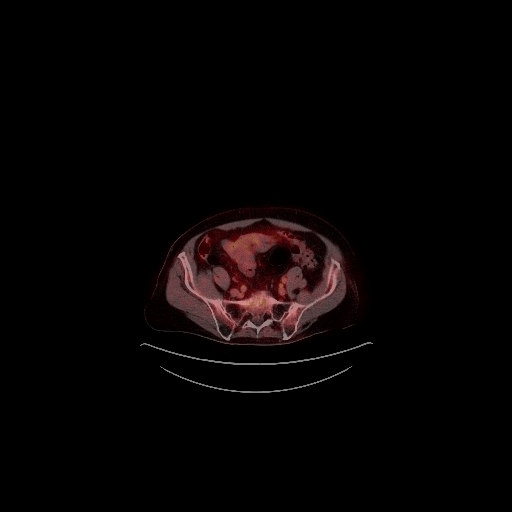
[im 286/286]
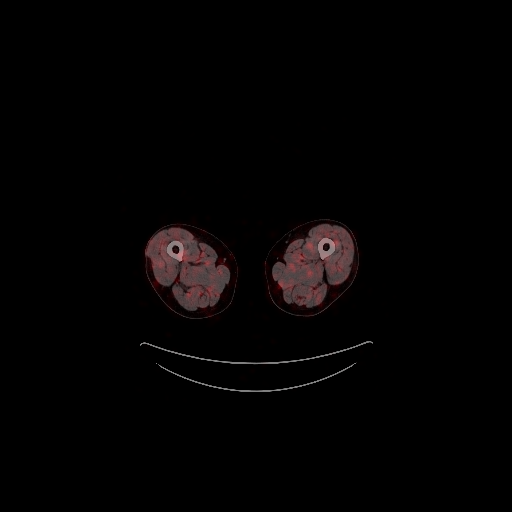

[Series 604: fused coronal · 1 of 93 slices shown]
[im 1/93]
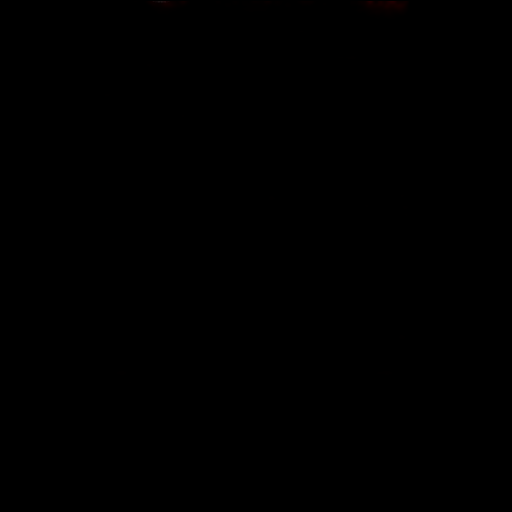

[Series 605: fused sagittal · 2 of 158 slices shown]
[im 1/158]
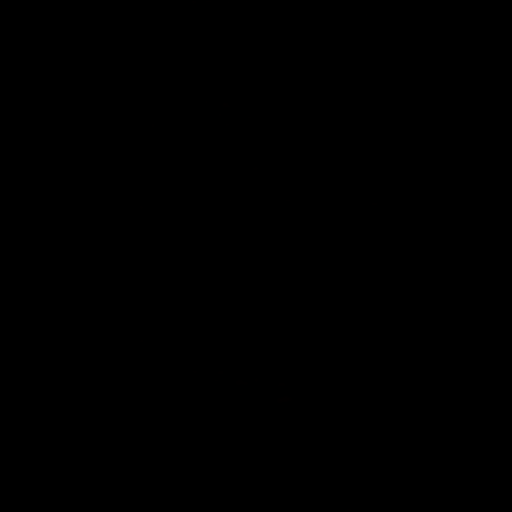
[im 158/158]
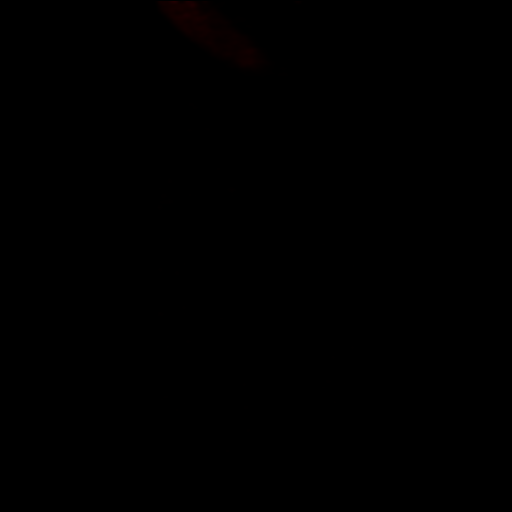

[Series 606: pet axial · 4 of 288 slices shown]
[im 1/288]
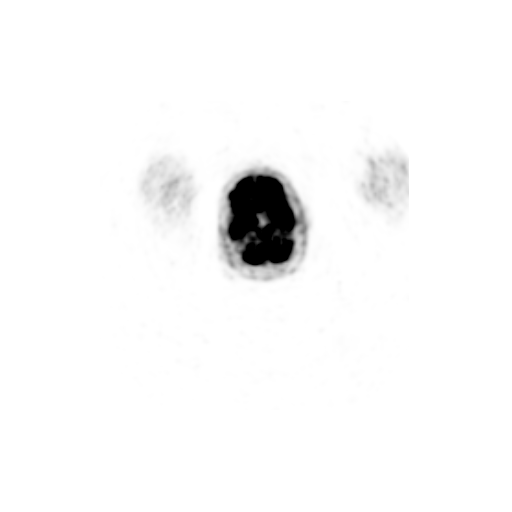
[im 96/288]
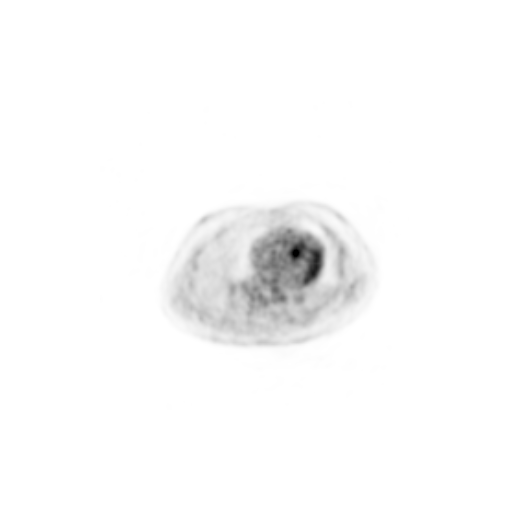
[im 192/288]
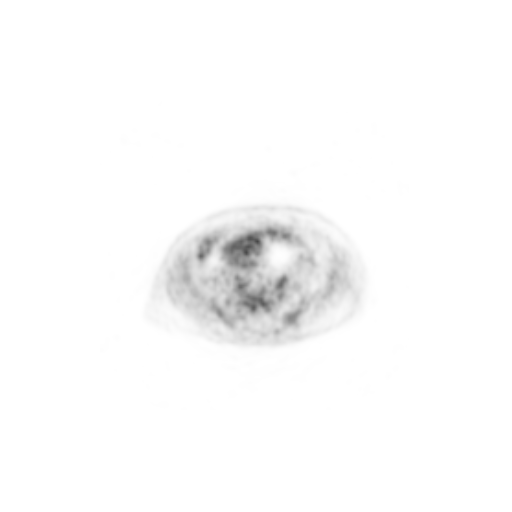
[im 288/288]
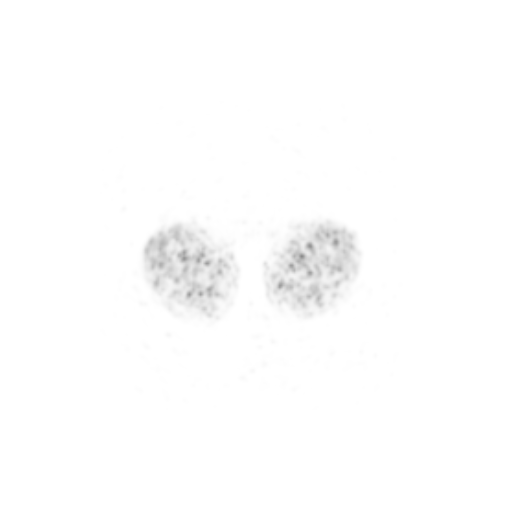

[Series 607: pet coronal · 1 of 113 slices shown]
[im 1/113]
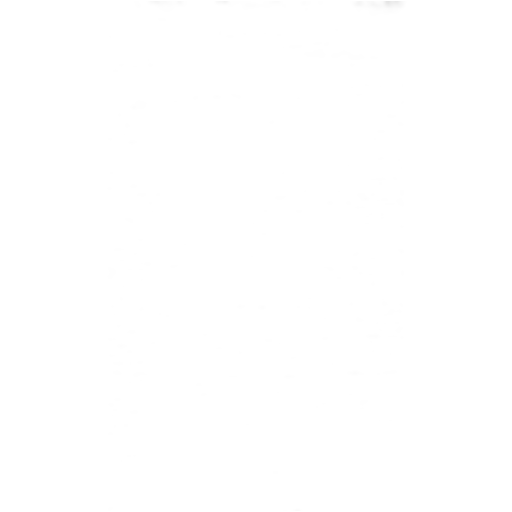

[Series 608: pet sagittal · 2 of 159 slices shown]
[im 1/159]
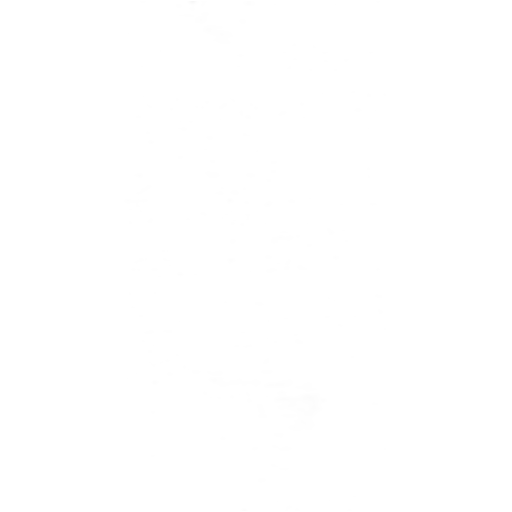
[im 159/159]
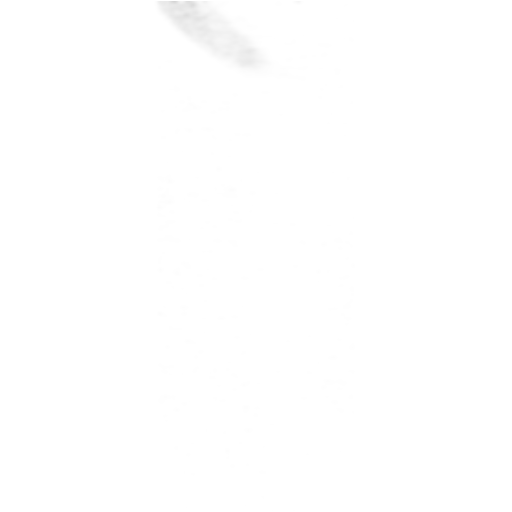

[24 of 25 positions shown; findings below may reference images not displayed]

FINDINGS: Mediastinal blood pool activity: SUV max

Liver activity: SUV max NA

NECK: No hypermetabolic lymph nodes in the neck.

Incidental CT findings: None.

CHEST: 6 mm short axis right supraclavicular node on image 53/series
3 is mildly hypermetabolic with SUV max = 3.5.

Several hypermetabolic right paratracheal nodes are evident
including 6 mm short axis node on 60/3 with SUV max = 4.3. 8 mm
short axis node on 68/3 has SUV max = 5.0.

No hypermetabolic hilar lymphadenopathy no hypermetabolic axillary
lymphadenopathy

No suspicious hypermetabolic pulmonary nodule or mass. Scattered
tiny bilateral pulmonary nodules are evident including a 5 mm right
lower lobe pulmonary nodule which was present on previous study from
10/02/2019 and not substantially changed in the interval. 5 mm
posterior right upper lobe nodule on 92/3 is also stable. Additional
small basilar predominant pulmonary nodules are noted bilaterally
which are not visible on the prior study possibly obscured by the
more prominent atelectasis in the lung bases on that exam.

Incidental CT findings: Heart is enlarged. Permanent pacemaker
noted. No pleural effusion.

ABDOMEN/PELVIS: Mottled radiotracer accumulation noted in the liver
parenchyma without a definite hypermetabolic hepatic mass lesion.
There is a small focus of hypermetabolism in the posterior hepatic
dome near the clip or marker. No hypermetabolism in the sigmoid
colon

Incidental CT findings: Cirrhotic liver morphology again noted. The
right hepatic lobe ablation defect seen on previous study from
04/07/2020 has decreased in the interval, visible on image 122/3
today. There is mild atherosclerotic calcification of the abdominal
aorta without aneurysm.

SKELETON: No focal hypermetabolic activity to suggest skeletal
metastasis. No worrisome lytic or sclerotic osseous abnormality.

Incidental CT findings: none
IMPRESSION: Small hypermetabolic lymph nodes in the right supraclavicular space
and right mediastinum. The mediastinal nodes are similar in
appearance on CT imaging to chest angio of 10/02/2019. The right
supraclavicular node cannot be discerned on that prior chest CT.
While interval stability on CT imaging suggests reactive etiology,
the degree of hypermetabolism is concerning and close follow-up
recommended as metastatic disease cannot be excluded.

Small hypermetabolic focus subcapsular posterior right hepatic dome,
indeterminate. Liver MRI may prove helpful to further evaluate.

Multiple tiny bilateral pulmonary nodules. Dominant nodules in the
right lung are stable since 10/02/2019 suggesting benign etiology.
Some of the nodules towards the bases were obscured by atelectasis
on the prior study.

Cirrhotic liver morphology.

## 2021-12-17 NOTE — Telephone Encounter (Signed)
I spoke with the patient. He advised that he is fine to wait for Dr. Olin Pia response regarding Dr. Burt Knack.  His other issue is that he has had to change PCP's. He is now following with [facility]. He is calling today as the PCP is requesting that an echo be performed. Per the patient, the echo will be done at the PCP office at no charge. However, he wanted to make sure he had not had this done recently.   I reviewed his chart and advised his last echos were in 2021, so if the office just wanted to perform another one, it has been over 2 years.   He has signed a consent to allow his PCP office access to his Medical Records from our practice/ [facility].   The patient advised he wants his echo report to come back to Dr. Caryl Comes for review once completed.   I have advised this is fine.

## 2021-12-17 NOTE — Telephone Encounter (Signed)
Attempted to contact the patient. No answer- I left a message stating I have reached out to Dr. Caryl Comes daily this week to see if he has had a conversation with Dr. Burt Knack [facility]), but per Dr. Caryl Comes, he has received no response from Dr. Burt Knack as of this time.  I advised he may call me back if needed for a different issue, but otherwise, I will follow up with Dr. Caryl Comes again tomorrow and call him back by the end of the day with an update.

## 2021-12-17 NOTE — Telephone Encounter (Signed)
Pt returning nurse's call. Please advise

## 2021-12-17 NOTE — Telephone Encounter (Signed)
Pt called asking for Alvis Lemmings, RN.

## 2021-12-23 LAB — PULMONARY FUNCTION TEST
FEV1/FVC: 66 %
FEV1: 2.28 L
FVC: 3.46 L

## 2021-12-31 ENCOUNTER — Encounter (HOSPITAL_COMMUNITY): Payer: 59

## 2022-01-12 ENCOUNTER — Telehealth: Payer: Self-pay | Admitting: Internal Medicine

## 2022-01-12 ENCOUNTER — Encounter (HOSPITAL_COMMUNITY): Payer: 59

## 2022-01-12 NOTE — Telephone Encounter (Signed)
Patient called stating he wants Hester Mates to give him a call.

## 2022-01-13 NOTE — Telephone Encounter (Signed)
Follow Up:     Patient is returning Heather's call.

## 2022-01-13 NOTE — Telephone Encounter (Signed)
I spoke with the patient.  He called to advise:  1) Echocardiogram: Done 01/05/22 @ [facility] - Per the patient, he requested that the echo be sent to [place], but per the office we will need to request this directly, even though he offered to sign a release for them.   Phone: [phone] Fax: [phone]   2) CPET (CPX): Done 12/23/21 at [facility] - He wanted to make sure Dr. Caryl Comes was aware of this. - I advised I could see he had the test, but no results released yet. - Per the patient, he is still waiting on a call from [place] about the results   3) Dr. Venetia Maxon, his neuromuscular specialist at [place] is working on a referral to the [facility]  - The patient would like to touch base with Dr. Caryl Comes to review all of the above. - I have offered him a virtual appointment on 02/04/22 at 8:40 am with Dr. Caryl Comes and he is agreeable.  The patient is aware that I will work on getting the results of his echo from [facility].  He voices understanding of the above and is very appreciative of the call back.

## 2022-01-13 NOTE — Telephone Encounter (Signed)
Attempted to call the patient. No answer- I left a message to please call back.  

## 2022-01-18 NOTE — Telephone Encounter (Signed)
Pt is calling to state that he just talked to [facility] about his CPET results and they will be sending them to [place] within the next few minutes, and pt is requesting a call back when you receive to confirm that you did receive the results.

## 2022-01-18 NOTE — Telephone Encounter (Signed)
Copy of CPX printed and given to Dr Caryl Comes for review.

## 2022-01-19 ENCOUNTER — Encounter: Payer: Self-pay | Admitting: Internal Medicine

## 2022-01-19 ENCOUNTER — Encounter: Payer: Self-pay | Admitting: *Deleted

## 2022-01-19 NOTE — Telephone Encounter (Signed)
Error

## 2022-01-19 NOTE — Telephone Encounter (Signed)
Pt is returning call. Transferred to [facility].

## 2022-01-19 NOTE — Telephone Encounter (Signed)
Copy of CPX is visible in Epic [facility]). I have tried again to fax a request for the patient's echo to [facility] to [phone]. The fax continues to fail at this time.  Attempted to call the patient. No answer- I left a detailed message on his voice mail that: 1) we have access to his CPX 2) I am still trying to obtain his echo report, but the fax has failed to go through since last week, but I will continue to try to get this.   I asked that he call back with any further questions/ concerns.

## 2022-01-19 NOTE — Telephone Encounter (Signed)
I spoke with the patient. He advised he called [facility] this morning and they are receiving faxes from other locations. I advised the patient I will try again to fax via [place] fax function to [facility] [place] fax sent requesting the echo report.   He also advised that a physician from [place] reviewed his CPX and thinks he needs to have his ICD reprogrammed.  We have changed his upcoming appointment from 8/17 telehealth, to an in person visit on 8/15 at 8:00 am with Dr. Caryl Comes to review his testing and advise if programming changes are needed to the device.   The patient voices understanding and is agreeable.

## 2022-01-27 ENCOUNTER — Telehealth: Payer: Self-pay | Admitting: Internal Medicine

## 2022-01-27 NOTE — Telephone Encounter (Signed)
I called and spoke with the patient. We discussed his upcoming appointment on 02/02/22 with Dr. Caryl Comes and all questions were answered.  The patient voices understanding and is appreciative of the call back.

## 2022-01-27 NOTE — Telephone Encounter (Signed)
Patient is requesting to speak with [name], RN regarding 8/15 appointment with Dr. Caryl Comes. He states it is not urgent, he just wants to ask her something.

## 2022-02-01 NOTE — Telephone Encounter (Signed)
   Pt is calling back, he said, he forgot to ask [name] if he needs a lab work for his appt with Dr. Caryl Comes

## 2022-02-01 NOTE — Telephone Encounter (Signed)
Spoke with patient and he just wanted to inquire if he should fast for his labs that may be ordered tomorrow. Advised that would be great as he may need the lipid panel done tomorrow. Discussed fasting for those labs and he was appreciative for the call back with no further questions at this time.

## 2022-02-02 ENCOUNTER — Encounter: Payer: Self-pay | Admitting: Internal Medicine

## 2022-02-02 ENCOUNTER — Ambulatory Visit (INDEPENDENT_AMBULATORY_CARE_PROVIDER_SITE_OTHER): Payer: 59 | Admitting: Internal Medicine

## 2022-02-02 VITALS — BP 104/64 | HR 73 | Ht 69.0 in | Wt 174.0 lb

## 2022-02-02 DIAGNOSIS — Z950 Presence of cardiac pacemaker: Secondary | ICD-10-CM

## 2022-02-02 DIAGNOSIS — I4891 Unspecified atrial fibrillation: Secondary | ICD-10-CM

## 2022-02-02 DIAGNOSIS — I442 Atrioventricular block, complete: Secondary | ICD-10-CM

## 2022-02-02 NOTE — Patient Instructions (Signed)
Medication Instructions:  Your physician recommends that you continue on your current medications as directed. Please refer to the Current Medication list given to you today.  *If you need a refill on your cardiac medications before your next appointment, please call your pharmacy*   Lab Work: None ordeerd If you have labs (blood work) drawn today and your tests are completely normal, you will receive your results only by: [place] (if you have MyChart) OR A paper copy in the mail If you have any lab test that is abnormal or we need to change your treatment, we will call you to review the results.   Testing/Procedures: None ordered   Follow-Up: At [facility], you and your health needs are our priority.  As part of our continuing mission to provide you with exceptional heart care, we have created designated Provider Care Teams.  These Care Teams include your primary Cardiologist (physician) and Advanced Practice Providers (APPs -  Physician Assistants and Nurse Practitioners) who all work together to provide you with the care you need, when you need it.  We recommend signing up for the patient portal called "MyChart".  Sign up information is provided on this After Visit Summary.  MyChart is used to connect with patients for Virtual Visits (Telemedicine).  Patients are able to view lab/test results, encounter notes, upcoming appointments, etc.  Non-urgent messages can be sent to your provider as well.   To learn more about what you can do with MyChart, go to [email or web address].    Your next appointment:   6 months  The format for your next appointment:   In Person  Provider:   You may see Virl Axe, MD or one of the following Advanced Practice Providers on your designated Care Team:   Murray Hodgkins, NP Christell Faith, PA-C Cadence Kathlen Mody, [place]   Other Instructions N/A  Important Information About [place]

## 2022-02-02 NOTE — Progress Notes (Signed)
.sk     ELECTROPHYSIOLOGY OFFICE NOTE  Patient ID: Devin Hale, MRN: [id number], DOB/AGE: Jul 20, 1950 71 y.o. Admit date: (Not on file) Date of Consult: 02/02/2022  Primary Physician: Marguerita Merles, MD Primary Cardiologist:  [facility]*     HPI Devin Hale is a 71 y.o. male seen in followup for pacemaker-His bundle Abbott follow-up--device implanted at [facility] 2/21 Implantation complicated by atrial lead dislodgment as well as pericarditis for which he received colchicine for a month. Postprocedural atrial fibrillation >>started on Eliquis  Evaluation also demonstrated unipolar pacing and pocket stimulation during backup pulsing.  Programmed to bipolar pacing  There has been an issue of possible sarcoid based on cMRI imaging.  Discussions with [facility] and [facility], sarcoid suggested* cMRI;  PET scan [facility] 10/22 "septal... Basal anterior and basal inferior with abnormal metabolism ... Nonspecific and could represent sarcoid or other inflammatory process "    also bilateral hilar and mediastinal lymph nodes"  Seen in consultation by Dr. Weyman Croon at [facility] 1/23.  Notes include "whole body PET. No hypermetabolic hilar lymph nodes. 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  Rheum etiology consultation also 1/23 suggested "some other form of inflammatory myopathy "  Also has hepatitis C-treated complicated by cirrhosis and interval development of hepatocellular cancer treated by ablation. Followed at [facility].  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 On long term steroids (initiated 10/22)   CPX at [facility] for DOE  Peak VO2 14; peak HR 109  Neuro [facility] working on referral to [facility]-- out of network but [facility] [employer] to be considered --so far, this has been unsuccessful  He continues to feel like his situation is deteriorating.  He is dyspneic at rest.  ADLs are becoming a struggle.  We have discontinued his statin; as best as I can tell and he recalls his pretreatment LDL was 114.  DATE TEST EF   2/21 LHC [facility]    % Nonobstructive Cx/RCA  4/21 Echo  55-65% Small mod pericardial effusion  9/21 cMRI  LGE in noncardiac distribution suggestive of sarcoid  2/23 cMRI  62% LGE  RV inferior insertion Basal anterior wall  Unchanged from previously  6/23 Echo 55-65% Ow normal  Aortic atherosclerosis    Date Cr K Hgb LDL  4/21 0.89 4.5 14.5 114  10/21 0.99 4.0 14.4   6/23 1.0 4.5 15.3      Past Medical History:  Diagnosis Date   Complete heart block ([place])    Hepatitis    Hepatitis C    Pacemaker       Surgical History:  Past Surgical History:  Procedure Laterality Date   COLONOSCOPY WITH PROPOFOL N/A 05/27/2020   Procedure: COLONOSCOPY WITH PROPOFOL;  Surgeon: Lucilla Lame, MD;  Location[facility] ENDOSCOPY;  Service: Endoscopy;  Laterality: N/A;   ESOPHAGOGASTRODUODENOSCOPY (EGD) WITH PROPOFOL N/A 05/27/2020   Procedure: ESOPHAGOGASTRODUODENOSCOPY (EGD) WITH PROPOFOL;  Surgeon: Lucilla Lame, MD;  Location: [facility] ENDOSCOPY;  Service: Endoscopy;  Laterality: N/A;   KNEE ARTHROSCOPY Left 1982   LIVER BIOPSY     NASAL SINUS SURGERY  2019   TEMPORARY PACEMAKER  N/A 08/13/2019   Procedure: TEMPORARY PACEMAKER;  Surgeon: [name], MD;  Location: [place] CV LAB;  Service: Cardiovascular;  Laterality: N/A;   TONSILLECTOMY       Home Meds: Current Meds  Medication Sig   ascorbic acid (VITAMIN C) 500 MG tablet Take 500 mg by mouth 2 (two) times daily.    aspirin EC 81 MG tablet Take 1 tablet (81 mg total) by mouth  daily. Swallow whole.   B Complex-Biotin-FA (VITAMIN B50 COMPLEX PO) Take by mouth daily.   cholecalciferol (VITAMIN D3) 25 MCG (1000 UNIT) tablet Take 1,000 Units by mouth in the morning and at bedtime.    Coenzyme Q10 (CO Q 10) 100 MG CAPS Take 100 mg by mouth daily.   denosumab (PROLIA) 60 MG/ML SOSY injection Inject 60 mg into the skin every 6 (six) months.   magnesium oxide (MAG-OX) 400 MG tablet Take 200 mg by mouth 2 (two) times daily.   metroNIDAZOLE (METROGEL) 1 % gel Apply 1 Application topically daily as needed.   Multiple Vitamin (MULTIVITAMIN WITH MINERALS) TABS tablet Take 1 tablet by mouth daily.   Omega-3 Fatty Acids (FISH OIL) 1000 MG CAPS Take 2 capsules by mouth daily.   predniSONE (DELTASONE) 10 MG tablet Take 10 mg by mouth daily.   vitamin E 200 UNIT capsule Take 200 Units by mouth daily.   Whey Protein POWD Take by mouth daily.    Allergies: No Known Allergies      ROS:  Please see the history of present illness.     All other systems reviewed and negative.   Well developed and well nourished in no acute distress HENT normal Neck supple with JVP-flat Clear Device pocket well healed; without hematoma or erythema.  There is no tethering  Regular rate and rhythm, no murmur Abd-soft with active BS No Clubbing cyanosis   edema Skin-warm and dry A & Oriented  Grossly normal sensory and motor function muscle wasting in the right hand  ECG sinus rhythm with P synchronous pacing at 73 Interval 17/16/43  Labs: Cardiac Enzymes No results for input(s): "CKTOTAL", "CKMB", "TROPONINI" in the last 72 hours. CBC Lab Results  Component Value Date   WBC 7.0 04/07/2020   HGB 14.4 04/07/2020   HCT 41.7 04/07/2020   MCV 85.6 04/07/2020   PLT 104 (L) 04/07/2020   PROTIME: No results for input(s): "LABPROT", "INR" in the last 72 hours. Chemistry No results for input(s): "NA", "K", "CL", "CO2", "BUN", "CREATININE", "CALCIUM", "PROT", "BILITOT", "ALKPHOS", "ALT",  "AST", "GLUCOSE" in the last 168 hours.  Invalid input(s): "LABALBU" Lipids No results found for: "CHOL", "HDL", "LDLCALC", "TRIG" BNP No results found for: "PROBNP" Thyroid Function Tests: No results for input(s): "TSH", "T4TOTAL", "T3FREE", "THYROIDAB" in the last 72 hours.  Invalid input(s): "FREET3" Miscellaneous No results found for: "DDIMER"  Radiology/Studies:  No results found.   EKG: Sinus with fusion   Assessment and Plan:  Complete heart block-intermittent  Pacemaker-[employer] with a [employer] His lead  Pericarditis post atrial lead microperforation/dislodgment  Dyspnea  Atrial fibrillation postprocedure  Coronary artery disease non-obstructive  Hepatitis C  Neuromuscular issue currently undiagnosed  Asymmetric edema   Heart rate histograms were reviewed; there are some blunting.  Recent work on HFpEF suggest faster rates might be of benefit.  We will activate rate response.  I do not think it can have a great deal of benefit for his dyspnea as he has a significant amount even at rest.  Last  DB 2 look at his CPX to see if he has any insight  Given his relatively low LDL prior to therapy at 114, and then multiple issues ongoing, including his liver cancer which is stable, have elected to, having reviewed the absolute benefits of statin therapies, hold off on resuming  Lengthy discussion and review of interval hx          Devin Hale

## 2022-02-04 ENCOUNTER — Telehealth: Payer: 59 | Admitting: Internal Medicine

## 2022-02-18 ENCOUNTER — Telehealth: Payer: Self-pay | Admitting: Internal Medicine

## 2022-02-18 NOTE — Telephone Encounter (Signed)
Pt calling would like a callback from nurse [name]) in regards to appt on 02/02/22. Please advise

## 2022-02-18 NOTE — Telephone Encounter (Signed)
Attempted to call the patient. No answer- I left a message to please call back.  

## 2022-02-18 NOTE — Telephone Encounter (Signed)
I spoke with the patient. He was calling to see if Dr. Caryl Comes was able to review his CPX test with someone at [facility].  I advised this may have been Dr. Haroldine Laws, but I am unsure of the answer to this. I will follow up with Dr. Caryl Comes and call him back tomorrow with an update.  The patient voices understanding and is agreeable.

## 2022-02-26 NOTE — Telephone Encounter (Signed)
Devin Sprang, MD  Devin Hale, Devin Pascal, MD; Devin Filbert, RN [name] get admitted maybe you could call me.  How do I interpret this kind of limitation in the setting of normal systolic function.  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  Thoughts?        Previous Messages    ----- Message -----  From: Jolaine Artist, MD  Sent: 02/23/2022  10:08 AM EDT  To: Devin Sprang, MD; Devin Filbert, RN   Got it.   He has a pretty severe HF limitation despite his normal EF.   Peak HR is 107. So about 75% of predicted max. Not terrible but likely contributing.  At peak exercise he gets close to his ventilatory limits 79% (over 80% is considered the point where it is significant)   So overall multifactorial but HF by far the leading cause. If EF was 25% would suggest transplant eval.     ----- Message -----  From: Devin Filbert, RN  Sent: 02/23/2022   9:25 AM EDT  To: Jolaine Artist, MD; Devin Sprang, MD; *   Hello!!   He actually had it at [facility], so it is in [employer].  ----- Message -----  From: Jolaine Artist, MD  Sent: 02/23/2022   9:15 AM EDT  To: Devin Sprang, MD; Devin Filbert, RN   [name],   It looks like it was scheduled but I can't see that it was ever done. Did I miss it?    ----- Message -----  From: Devin Sprang, MD  Sent: 02/22/2022   4:07 PM EDT  To: Jolaine Artist, MD; Devin Filbert, RN   [name], would you do me a favor and look at this guy CPX and see if it affords any insights.  He was told afterwards to come see me for device reprogramming and I am found that it was about heart rate.  Thanks

## 2022-02-26 NOTE — Telephone Encounter (Signed)
Attempted to call the patient to update him of the conversation below regarding his CPX testing. No answer- I left a message advising the patient of the conversation below and that Dr. Caryl Comes is still in the process of speaking with Dr. Haroldine Laws regarding further thoughts on the interpretation.  I advised I will call back with further updates once received.

## 2022-03-02 ENCOUNTER — Encounter: Payer: Self-pay | Admitting: Internal Medicine

## 2022-03-04 ENCOUNTER — Telehealth: Payer: Self-pay | Admitting: Internal Medicine

## 2022-03-04 ENCOUNTER — Encounter: Payer: Self-pay | Admitting: Internal Medicine

## 2022-03-04 NOTE — Telephone Encounter (Signed)
The patient called today with concerns about abnormal labs through his PCP that he wanted Dr. Caryl Comes to review ASAP.  Per the patient, he was told his platelet count was low 100's. I advised the patient that his Platelet counts have been 101-109 since 03/2020 that I can see.   He was also concerned that his lipid panel (non fasting) showed his total cholesterol was > 200. He could not recall his LDL. He has been off statin therapy since May 2023. Per his last office visit with Dr. Caryl Comes on 02/02/22: Given his relatively low LDL prior to therapy at 114, and then multiple issues ongoing, including his liver cancer which is stable, have elected to, having reviewed the absolute benefits of statin therapies, hold off on resuming.  I advised the patient that I do not think he has had an abrupt change in his platelet count, so I am not sure what his PCP is comparing this to to cause him concern.  Also, I have advised him that I will review his Lipid panel with Dr. Caryl Comes upon his return to clinic on 03/16/22 and discuss how best to proceed with Lipid management. The patient is aware that he is currently off statin therapy in the context of his medical history w/ his Liver Cancer.   He is aware I will reach back out to him when Dr. Caryl Comes returns. I have also discussed the possibility of a lipid clinic referral if needed and he stated he would  be agreeable to this.  The patient voices understanding of the above and is agreeable.  After hanging up with the patient, his lab report had been scanned into his chart and now forwarded to Dr. Caryl Comes to review:  Labs done 03/02/22: Platelets- 104 Total cholesterol- 201 LDL- 128

## 2022-03-05 ENCOUNTER — Ambulatory Visit (INDEPENDENT_AMBULATORY_CARE_PROVIDER_SITE_OTHER): Payer: 59

## 2022-03-05 DIAGNOSIS — I442 Atrioventricular block, complete: Secondary | ICD-10-CM

## 2022-03-06 LAB — CUP PACEART REMOTE DEVICE CHECK
Battery Remaining Longevity: 44 mo
Battery Remaining Percentage: 66 %
Battery Voltage: 2.98 V
Brady Statistic AP VP Percent: 43 %
Brady Statistic AP VS Percent: 1 %
Brady Statistic AS VP Percent: 57 %
Brady Statistic AS VS Percent: 1 %
Brady Statistic RA Percent Paced: 42 %
Brady Statistic RV Percent Paced: 99 %
Date Time Interrogation Session: 20230915020014
Implantable Lead Implant Date: 20210224
Implantable Lead Implant Date: 20210224
Implantable Lead Location: 753859
Implantable Lead Location: 753860
Implantable Lead Model: 7842
Implantable Lead Serial Number: 1032895
Implantable Pulse Generator Implant Date: 20210224
Lead Channel Impedance Value: 390 Ohm
Lead Channel Impedance Value: 530 Ohm
Lead Channel Pacing Threshold Amplitude: 1.125 V
Lead Channel Pacing Threshold Amplitude: 1.5 V
Lead Channel Pacing Threshold Pulse Width: 1 ms
Lead Channel Pacing Threshold Pulse Width: 1 ms
Lead Channel Sensing Intrinsic Amplitude: 2.5 mV
Lead Channel Sensing Intrinsic Amplitude: 5.2 mV
Lead Channel Setting Pacing Amplitude: 1.375
Lead Channel Setting Pacing Amplitude: 3 V
Lead Channel Setting Pacing Pulse Width: 1 ms
Lead Channel Setting Sensing Sensitivity: 2 mV
Pulse Gen Model: 2272
Pulse Gen Serial Number: 9197143

## 2022-03-10 NOTE — Telephone Encounter (Signed)
H  good morning from [place]  on our way home and back on Epic He and I talked aobut his cholesterol and we can do so again, I am disinclined to push the statin issue with all the rest that is ongoing We can chat about this next week And I will reach back out to DB Thanks SK

## 2022-03-16 NOTE — Progress Notes (Signed)
Remote pacemaker transmission.   

## 2022-03-17 ENCOUNTER — Ambulatory Visit: Admit: 2022-03-17 | Discharge: 2022-03-18 | Payer: MEDICARE

## 2022-03-17 ENCOUNTER — Ambulatory Visit: Payer: 59 | Admitting: Dermatology

## 2022-03-17 DIAGNOSIS — K7460 Unspecified cirrhosis of liver: Principal | ICD-10-CM

## 2022-03-17 NOTE — Telephone Encounter (Signed)
See phone noted dated 03/04/22,

## 2022-03-17 NOTE — Telephone Encounter (Signed)
Noted  

## 2022-03-17 NOTE — Telephone Encounter (Signed)
Patient called back to say that is fine and he appreciate it very much. Please advise

## 2022-03-17 NOTE — Telephone Encounter (Signed)
Reviewed the patient's lab results further with Dr. Caryl Comes yesterday.  Per Dr. Caryl Comes- in the context of the patient's current lipid numbers/ medical history he is in an intermediate risk category at this time. The role of statin therapy is not clear as far as being in favor of this.  Dr. Caryl Comes would like to review this further with the patient, along with his CPX test next week.  I attempted to call the patient and advise him of the above.  No answer-  I left a detailed message of the above on the patient's identified voice mail.  I advised that I can work him in for a virtual visit on 03/25/22 at 12:00 pm and that I will go ahead and book him in this slot. I asked that he call back to confirm if that will work.  I do not need to speak with him unless he request it.

## 2022-03-25 ENCOUNTER — Ambulatory Visit: Payer: 59 | Admitting: Internal Medicine

## 2022-03-25 ENCOUNTER — Ambulatory Visit: Payer: 59 | Attending: Internal Medicine | Admitting: Internal Medicine

## 2022-03-25 ENCOUNTER — Telehealth: Payer: Self-pay | Admitting: Internal Medicine

## 2022-03-25 DIAGNOSIS — R0609 Other forms of dyspnea: Secondary | ICD-10-CM

## 2022-03-25 DIAGNOSIS — Z950 Presence of cardiac pacemaker: Secondary | ICD-10-CM

## 2022-03-25 DIAGNOSIS — I442 Atrioventricular block, complete: Secondary | ICD-10-CM | POA: Diagnosis not present

## 2022-03-25 DIAGNOSIS — E782 Mixed hyperlipidemia: Secondary | ICD-10-CM | POA: Diagnosis not present

## 2022-03-25 DIAGNOSIS — I4891 Unspecified atrial fibrillation: Secondary | ICD-10-CM

## 2022-03-25 DIAGNOSIS — M810 Age-related osteoporosis without current pathological fracture: Principal | ICD-10-CM

## 2022-03-25 NOTE — Progress Notes (Signed)
Electrophysiology TeleHealth Note      Date:  04/02/2022   ID:  Devin Hale, DOB [birth year], MRN [id number]  Location: patient's home  Provider location: [address] [place]  Evaluation Performed: Follow-up visit  PCP:  Marguerita Merles, MD  Cardiologist:     Electrophysiologist:  SK   Chief Complaint:     History of Present Illness:    Devin Hale is a 71 y.o. male who presents via audio/video conferencing for a telehealth visit today.  Since last being seen in our clinic   the patient reports continuing to feel like life is ebbing away  Purpose of the call was to review lipids and strategy and CPX done at [facility]    2/21 [facility]    % Nonobstructive Cx/RCA  4/21 Echo  55-65% Small mod pericardial effusion  9/21 cMRI   LGE in noncardiac distribution suggestive of sarcoid  2/23 cMRI  62% LGE  RV inferior insertion Basal anterior wall  Unchanged from previously  6/23 Echo 55-65% Ow normal  Aortic atherosclerosis     Date Cr K Hgb LDL  4/21 0.89 4.5 14.5 114  10/21 0.99 4.0 14.4    6/23 1.0 4.5 15.3  128   Continue calculator identifies a 14.7% risk which could be reduced to 12.5% by the addition of (resumption of) statin  The patient denies symptoms of fevers, chills, cough, or new SOB worrisome for COVID 19.    Past Medical History:  Diagnosis Date   Complete heart block ([place])    Hepatitis    Hepatitis C    Pacemaker     Past Surgical History:  Procedure Laterality Date   COLONOSCOPY WITH PROPOFOL N/A 05/27/2020   Procedure: COLONOSCOPY WITH PROPOFOL;  Surgeon: Lucilla Lame, MD;  Location: [facility] ENDOSCOPY;  Service: Endoscopy;  Laterality: N/A;   ESOPHAGOGASTRODUODENOSCOPY (EGD) WITH PROPOFOL N/A 05/27/2020   Procedure: ESOPHAGOGASTRODUODENOSCOPY (EGD) WITH PROPOFOL;  Surgeon: Lucilla Lame, MD;  Location: [facility] ENDOSCOPY;  Service: Endoscopy;  Laterality: N/A;   KNEE ARTHROSCOPY Left 1982   LIVER BIOPSY     NASAL SINUS SURGERY   2019   TEMPORARY PACEMAKER N/A 08/13/2019   Procedure: TEMPORARY PACEMAKER;  Surgeon: [name], MD;  Location: [facility];  Service: Cardiovascular;  Laterality: N/A;   TONSILLECTOMY      Current Outpatient Medications  Medication Sig Dispense Refill   ascorbic acid (VITAMIN C) 500 MG tablet Take 500 mg by mouth 2 (two) times daily.      aspirin EC 81 MG tablet Take 1 tablet (81 mg total) by mouth daily. Swallow whole.     B Complex-Biotin-FA (VITAMIN B50 COMPLEX PO) Take by mouth daily.     cholecalciferol (VITAMIN D3) 25 MCG (1000 UNIT) tablet Take 1,000 Units by mouth in the morning and at bedtime.      Coenzyme Q10 (CO Q 10) 100 MG CAPS Take 100 mg by mouth daily.     denosumab (PROLIA) 60 MG/ML SOSY injection Inject 60 mg into the skin every 6 (six) months.     magnesium oxide (MAG-OX) 400 MG tablet Take 200 mg by mouth 2 (two) times daily.     metroNIDAZOLE (METROGEL) 1 % gel Apply 1 Application topically daily as needed.     Multiple Vitamin (MULTIVITAMIN WITH MINERALS) TABS tablet Take 1 tablet by mouth daily.     Omega-3 Fatty Acids (FISH OIL) 1000 MG CAPS Take 2  capsules by mouth daily.     predniSONE (DELTASONE) 10 MG tablet Take 10 mg by mouth daily.     vitamin E 200 UNIT capsule Take 200 Units by mouth daily.     Whey Protein POWD Take by mouth daily.     No current facility-administered medications for this visit.    Allergies:   Patient has no known allergies.   Social History:  The patient  reports that he has never smoked. He has never used smokeless tobacco. He reports that he does not drink alcohol and does not use drugs.   Family History:  The patient's   family history includes Heart attack in his father; Non-Hodgkin's lymphoma in his mother.   ROS:  Please see the history of present illness.   All other systems are personally reviewed and negative.    Exam:    Vital Signs:  There were no vitals taken for this visit.     Labs/Other Tests  and Data Reviewed:    Recent Labs: 10/05/2021: Creatinine, Ser 0.90   Wt Readings from Last 3 Encounters:  02/02/22 174 lb (78.9 kg)  07/21/21 177 lb (80.3 kg)  04/14/21 172 lb (78 kg)     Other studies personally reviewed: Additional studies/ records that were reviewed today include: (As above)   Review of the above records today demonstrates:  Prior radiographs:   CPX  discussed with Dr DB, severe heart failure limitation in the setting of preserved ejection fraction  ASSESSMENT & PLAN:    Complete heart block-intermittent   Pacemaker-[employer] with a [employer] His lead   Pericarditis post atrial lead microperforation/dislodgment   Dyspnea   Atrial fibrillation postprocedure   Coronary artery disease non-obstructive   Hepatitis C/hepatocellular carcinoma treated with ablation   Neuromuscular issue currently undiagnosed   Asymmetric edema  Congestive  heart failure preserved ejection fraction  Lengthy discussion in two calls regarding 1) Lipids--10-year risk was calculated from the [facility] calculator and the potential impact of statin therapy was considered.  The overall 10-year benefit was estimated at less than 3%, i.e. about 0.3 %/year for the aggregate endpoint.  We elected at this juncture to not resume statin therapy 2) CPX discussed with Dr. Reine Just and reviewed the results.  The high ratio of 1.5 is potentially consistent with the neuromuscular concerns being pursued, and with his very poor performance overall, recommended cardiac rehab and then thereafter he would be glad to see in consultation, this could potentially be done in [place]     Follow-up: 73-month335-monthvaluation with Dr. DBReine Just  Current medicines are reviewed at length with the patient today.   The patient  concerns regarding his medicines.  The following changes were made today:    Labs/ tests ordered today include: Cardiac rehab No orders of the defined types were placed in this  encounter.     Today, I have spent 21 minutes with the patient with telehealth technology discussing the above.  Signed, StVirl AxeMD  04/02/2022 9:38 AM     [place] [phone]) ([phone])

## 2022-03-25 NOTE — Telephone Encounter (Signed)
I reached out to Dr. Caryl Comes to find out what was going on with this patient's visit. Per Dr. Caryl Comes, he had tried to call the patient back and it went to voice mail. He advised he would call him back.    I called and spoke with the patient and advised him of the above response from Dr. Caryl Comes. He voices understanding and is agreeable.

## 2022-03-25 NOTE — Telephone Encounter (Signed)
Patient called wanting to speak with [facility].  He stated he got half way through the phone visit with Dr. Caryl Comes, and Dr. Caryl Comes never called him back.

## 2022-03-30 ENCOUNTER — Ambulatory Visit
Admit: 2022-03-30 | Discharge: 2022-03-31 | Payer: MEDICARE | Attending: Radiation Oncology | Primary: Radiation Oncology

## 2022-03-30 ENCOUNTER — Ambulatory Visit
Admit: 2022-03-30 | Discharge: 2022-04-06 | Payer: MEDICARE | Attending: Radiation Oncology | Primary: Radiation Oncology

## 2022-03-31 ENCOUNTER — Telehealth: Payer: Self-pay | Admitting: Internal Medicine

## 2022-03-31 DIAGNOSIS — I5032 Chronic diastolic (congestive) heart failure: Secondary | ICD-10-CM

## 2022-03-31 NOTE — Telephone Encounter (Signed)
Dr. Caryl Comes- your note from his phone visit is not complete.  What are the orders from that day?

## 2022-03-31 NOTE — Telephone Encounter (Signed)
Pt calling about a referral for cardiac rehab that was discussed at last visit.

## 2022-04-05 ENCOUNTER — Institutional Professional Consult (permissible substitution): Admit: 2022-04-05 | Discharge: 2022-04-06 | Payer: MEDICARE

## 2022-04-05 DIAGNOSIS — M810 Age-related osteoporosis without current pathological fracture: Principal | ICD-10-CM

## 2022-04-05 NOTE — Addendum Note (Signed)
Addended by: Alvis Lemmings C on: 04/05/2022 05:07 PM   Modules accepted: Orders

## 2022-04-05 NOTE — Patient Instructions (Addendum)
Medication Instructions:  - Your physician recommends that you continue on your current medications as directed. Please refer to the Current Medication list given to you today.  *If you need a refill on your cardiac medications before your next appointment, please call your pharmacy*   Lab Work: - none ordered  If you have labs (blood work) drawn today and your tests are completely normal, you will receive your results only by: [place] (if you have MyChart) OR A paper copy in the mail If you have any lab test that is abnormal or we need to change your treatment, we will call you to review the results.   Testing/Procedures:  1) Cardiac Rehab You have been referred to cardiac rehab. This is a combination program including monitored exercise, dietary education, and support group. We strongly recommend participating in the program. Expect a phone call from them in approximately 2 weeks.  If it has been more than 2 weeks and you have not been contacted, please call the Cardiac Rehab Department directly at [phone]     Follow-Up: At [facility], you and your health needs are our priority.  As part of our continuing mission to provide you with exceptional heart care, we have created designated Provider Care Teams.  These Care Teams include your primary Cardiologist (physician) and Advanced Practice Providers (APPs -  Physician Assistants and Nurse Practitioners) who all work together to provide you with the care you need, when you need it.  We recommend signing up for the patient portal called "MyChart".  Sign up information is provided on this After Visit Summary.  MyChart is used to connect with patients for Virtual Visits (Telemedicine).  Patients are able to view lab/test results, encounter notes, upcoming appointments, etc.  Non-urgent messages can be sent to your provider as well.   To learn more about what you can do with MyChart, go to [email or web address].     Your next appointment:   1) 3 months with Dr. Haroldine Laws  2) 6 months with Dr. Caryl Comes   The format for your next appointment:   In Person  Provider:   As above     Other Instructions N/a  Important Information About Sugar

## 2022-04-06 ENCOUNTER — Other Ambulatory Visit: Payer: Self-pay | Admitting: *Deleted

## 2022-04-06 DIAGNOSIS — I5032 Chronic diastolic (congestive) heart failure: Secondary | ICD-10-CM

## 2022-04-06 NOTE — Telephone Encounter (Signed)
Confirmed with Dr. Caryl Comes that the patient will need a Cardiac Rehab referral for [facility].   Order placed.  Attempted to call the patient. I advised him that the order for Cardiac Rehab has been placed and that he should hear from them directly within ~ 2 weeks. However, if it has been more than 2 weeks, he should call the Cardiac Rehab depart ment directly at [phone].  I advised I have also put in a referral to Dr. Haroldine Laws as well to see him at the CHF clinic here in [place] and that his office will reach out to him directly with that appointment.   I asked that he call back with any further questions or concerns.

## 2022-04-06 NOTE — Telephone Encounter (Signed)
Pt states, "He got it. THANKS. He already received call from Cardiac Rehab". He states no need to call back.

## 2022-04-13 ENCOUNTER — Ambulatory Visit: Admit: 2022-04-13 | Discharge: 2022-04-14 | Payer: MEDICARE

## 2022-04-13 ENCOUNTER — Institutional Professional Consult (permissible substitution): Admit: 2022-04-13 | Discharge: 2022-04-14 | Payer: MEDICARE | Attending: Audiologist | Primary: Audiologist

## 2022-04-13 MED ORDER — MOMETASONE 50 MCG/ACTUATION NASAL SPRAY
Freq: Every day | NASAL | 2 refills | 0 days | Status: CP
Start: 2022-04-13 — End: ?

## 2022-04-15 ENCOUNTER — Telehealth (INDEPENDENT_AMBULATORY_CARE_PROVIDER_SITE_OTHER): Payer: Self-pay | Admitting: Vascular Surgery

## 2022-04-15 NOTE — Telephone Encounter (Signed)
Patient LVM stating he wants his medical records from his visit with Dr. Lucky Cowboy.  He states it has been about 3 years since he was last seen.  Please advise.

## 2022-04-16 ENCOUNTER — Ambulatory Visit: Payer: 59 | Attending: Internal Medicine | Admitting: Internal Medicine

## 2022-04-16 VITALS — BP 123/79 | HR 73 | Wt 178.4 lb

## 2022-04-16 DIAGNOSIS — R9430 Abnormal result of cardiovascular function study, unspecified: Secondary | ICD-10-CM | POA: Diagnosis not present

## 2022-04-16 DIAGNOSIS — M3320 Polymyositis, organ involvement unspecified: Secondary | ICD-10-CM

## 2022-04-16 DIAGNOSIS — R0609 Other forms of dyspnea: Secondary | ICD-10-CM

## 2022-04-16 NOTE — Telephone Encounter (Signed)
Left a message on patient voicemail to return call to the office 

## 2022-04-16 NOTE — Progress Notes (Signed)
ADVANCED HF CLINIC CONSULT NOTE  Referring Physician: Dr. Caryl Comes Primary Care: Marguerita Merles, MD Primary Cardiologist: Dr. Caryl Comes  HPI:  Devin Hale is a 71 y.o. male with HCV s/p treatment, CHB s/p pacer, possible HF and severe inflammatory myopathy/polymyositis. Referred by Dr. Caryl Comes for further evaluation of abnormal CPX testing.  Over past several years has had extensive w/u at [facility] for diffuse inflammatory myopathy.   Cardiac testing summarized below: - Cath 2/21: nonobstructive CAD - PET scan [facility] 10/22 "septal... Basal anterior and basal inferior with abnormal metabolism ... Nonspecific and could represent sarcoid or other inflammatory process "    also bilateral hilar and mediastinal lymph nodes" - cMRI 2/23 EF 62% RV normal  a.  There is focal, non-specific hyperenhancement vs fat (contiguous with fat in the pericardial space tracking along a pericardial reflection) on the RV side of basal anteroseptum.  b.   There is focal, non-specific hyperenhancement invovling the inferior RV insertion site.  c.   There is a small focal region of hyperenhancement involving the basal anterior wall and distal inferior wall (non-specific, but could reflect distal secondary branch coronary atherosclerosis). The regions of hyperenhancement are unchanged in size. There are no new regions of hyperenhancement. - CPX 7/23 pVO2 14.0 (50%) RER 1.50 slope 38.5 - Echo 6/23 EF 55-65%   He has met with Pulmonary for evaluation and whole body PET with evidence of hypermetabolic paratracheal LN but no hypermetabolic hilar LAN or axillary LN and no evidence of pulmonary sarcoid.  Has seen Dr. Weyman Croon at [facility] and felt not to have cardiac sarcoid. 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 He also had and antibody testing  positive for PM-Scl-75 and Titin. He was started on prednisone 10 by his Neurologist (Dr. Venetia Maxon). We was referred to [facility] for further evaluation but denied due to out of network.   Says he is losing ground quickly. Doesn't feel like prednisone is helping.SOB and weak with any activity. Mild edema RLE due to venous stasis. No orthopnea or PND  Review of Systems: [y] = yes, _0  = no   General: Weight gain _1 ; Weight loss _2 ; Anorexia _3 ; Fatigue Blue.Reese ]; Fever _4 ; Chills _5 ; Weakness _6   Cardiac: Chest pain/pressure _7 ; Resting SOB _8 ; Exertional SOB Blue.Reese ]; Orthopnea _9 ; Pedal Edema _10 ; Palpitations _11 ; Syncope _12 ; Presyncope _13 ; Paroxysmal nocturnal dyspnea_14   Pulmonary: Cough _15 ; Wheezing_16 ; Hemoptysis_17 ; Sputum _18 ; Snoring _19   GI: Vomiting_20 ; Dysphagia_21 ; Melena_22 ; Hematochezia _23 ; Heartburn_24 ; Abdominal pain _25 ; Constipation _26 ; Diarrhea _27 ; BRBPR _28   GU: Hematuria_29 ; Dysuria _30 ; Nocturia_31   Vascular: Pain in legs with walking [ y]; Pain in feet with lying flat _32 ; Non-healing sores _33 ; Stroke _34 ; TIA _35 ; Slurred speech _36 ;  Neuro: Headaches_37 ; Vertigo_38 ; Seizures_39 ; Paresthesias_40 ;Blurred vision _41 ; Diplopia _42 ; Vision changes _43   Ortho/Skin: Arthritis [ y]; Joint pain Blue.Reese ]; Muscle pain Blue.Reese ]; Joint swelling _44 ; Back Pain _45 ; Rash _46   Psych: Depression[ y]; Anxiety_47   Heme: Bleeding problems _48 ; Clotting disorders _49 ; Anemia _50   Endocrine: Diabetes _51 ; Thyroid dysfunction_52   Past Medical History:  Diagnosis Date   Complete heart block (H[facility])    Hepatitis    Hepatitis C    Pacemaker     Current Outpatient Medications  Medication Sig Dispense Refill   ascorbic acid (VITAMIN C) 500 MG tablet Take 500 mg by mouth 2 (two) times daily.      aspirin EC 81 MG tablet Take 1 tablet (81 mg total) by mouth daily. Swallow whole.     B Complex-Biotin-FA (VITAMIN B50 COMPLEX PO) Take by mouth daily.     cholecalciferol (VITAMIN D3) 25  MCG (1000 UNIT) tablet Take 1,000 Units by mouth in the morning and at bedtime.      Coenzyme Q10 (CO Q 10) 100 MG CAPS Take 100 mg by mouth daily.     denosumab (PROLIA) 60 MG/ML SOSY injection Inject 60 mg into the skin every 6 (six) months.     magnesium oxide (MAG-OX) 400 MG tablet Take 200 mg by mouth 2 (two) times daily.     metroNIDAZOLE (METROGEL) 1 % gel Apply 1 Application topically daily as needed.     [START ON 04/20/2022] mometasone (NASONEX) 50 MCG/ACT nasal spray Place 2 sprays into the nose daily.     Multiple Vitamin (MULTIVITAMIN WITH MINERALS) TABS tablet Take 1 tablet by mouth daily.     Omega-3 Fatty Acids (FISH OIL) 1000 MG CAPS Take 2 capsules by mouth daily.     predniSONE (DELTASONE) 10 MG tablet Take 10 mg by mouth daily.     vitamin E 200 UNIT capsule Take 200 Units by mouth daily.     Whey Protein POWD Take by mouth daily.     No current facility-administered medications for this visit.    No Known Allergies    Social History   Socioeconomic History   Marital status: Single    Spouse name: Not on file   Number of children: Not on file   Years of education: Not on file   Highest education level: Not on file  Occupational History   Not on file  Tobacco Use   Smoking status: Never   Smokeless tobacco: Never  Vaping Use   Vaping Use: Never used  Substance and Sexual Activity   Alcohol use: No   Drug use: Never   Sexual activity: Not on file  Other Topics Concern   Not on file  Social History Narrative   Not on file   Social Determinants of Health   Financial Resource Strain: Not on file  Food Insecurity: Not on file  Transportation Needs: Not on file  Physical Activity: Not on file  Stress: Not on file  Social Connections: Not on file  Intimate Partner Violence: Not on file      Family History  Problem Relation Age of Onset   Non-Hodgkin's lymphoma Mother    Heart attack Father     Vitals:   04/16/22 1035  Weight: 178 lb 6 oz (80.9  kg)    PHYSICAL EXAM: General:  Sitting in chair. No respiratory difficulty HEENT: normal Neck: supple. no JVD. Carotids 2+ bilat; no bruits. No lymphadenopathy or thryomegaly appreciated. Cor: PMI nondisplaced. Regular rate & rhythm. No rubs, gallops or murmurs. Lungs: clear Abdomen: soft, nontender, nondistended. No hepatosplenomegaly. No bruits or masses. Good bowel sounds. Extremities: no cyanosis, clubbing, rash, edema.  thenar wasting R >L Neuro: alert & oriented x 3, cranial nerves grossly intact. moves all 4 extremities w/o difficulty. Mildly weak in hip and shoulder girdle muscles  ASSESSMENT & PLAN:   1. Severe dyspnea on exertion with abnormal cardiopulmonary exercise testing - His EF has been normal but cMRI and PET do suggest mild cardiac involvement in the setting of a systemic inflammatory myopathy - CPX testing demonstrates a moderate functional limitation. The elevated VE/VCO2 slope is c/w with a potential cardiac limitation.The markedly elevated RER can be seen in mitochondrial myopathys - Despite the results of his CPX testing, I do not believe that his functional limitation is primarily cardiac in nature. We did discuss potential invasive exercise testing to further elucidate a potential cardiac component but I am not sure what we would do with this information other than to continue to treat his underlying polymyositis so at this point we will defer - no evidence of clinical HF on exam   2. 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   Glori Bickers, MD  5:13 PM

## 2022-04-16 NOTE — Telephone Encounter (Signed)
Patient has came by the office to pick up records

## 2022-04-17 IMAGING — CT CT CHEST W/ CM
2 of 4 series · 14 of 36 positions shown, 17 images · IV contrast (agent unspecified)
Comparison: Previous PET-CT done on 06/03/2021 and CT chest done on
10/02/2019

CLINICAL DATA: Follow-up of lymphadenopathy seen in the previous
study

EXAM:
CT CHEST WITH CONTRAST
TECHNIQUE: Multidetector CT imaging of the chest was performed during
intravenous contrast administration.

[Series 2: axial chest 2.00 · axial · 0.79mm/px · z∈[-1267,-941]mm · 11 of 193 slices shown, 14 images]
[im 15/193  mediastinal]
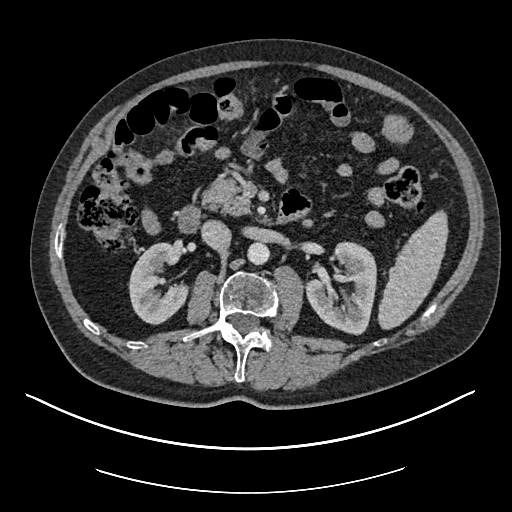
[im 15/193  lung]
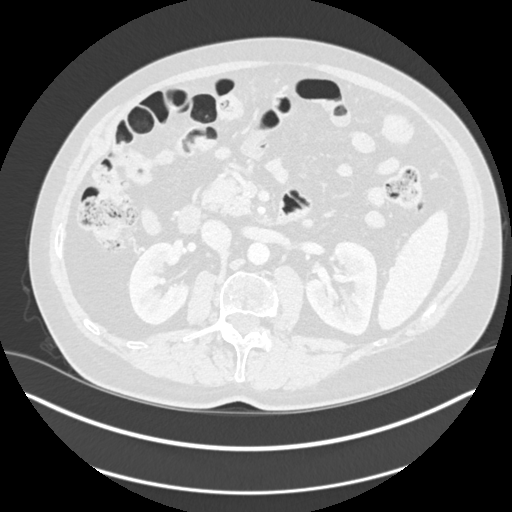
[im 30/193  lung]
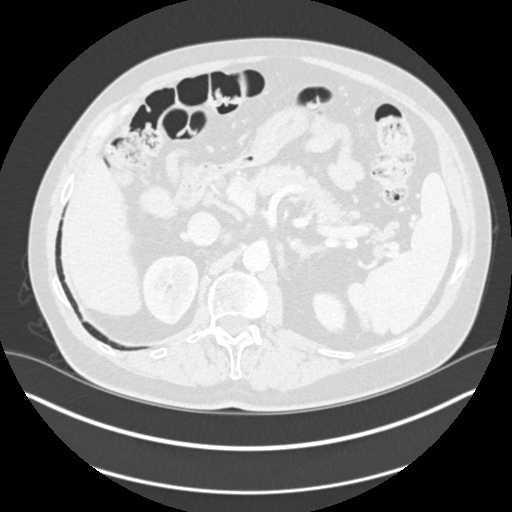
[im 45/193  lung]
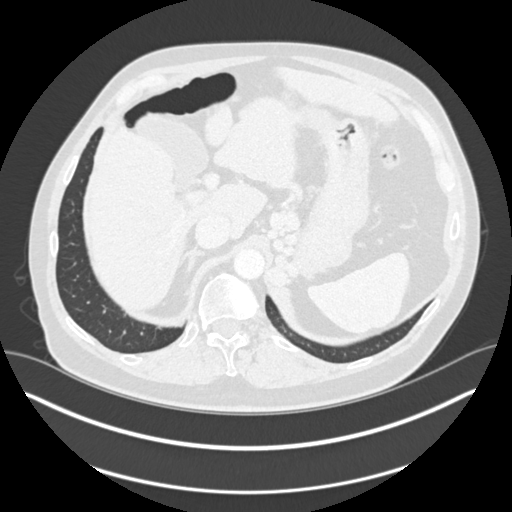
[im 60/193  lung]
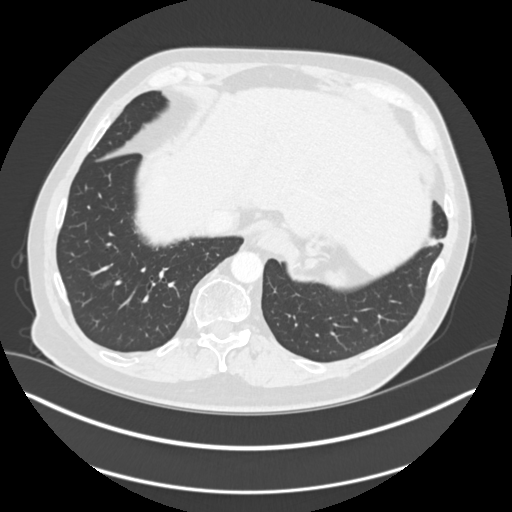
[im 74/193  mediastinal]
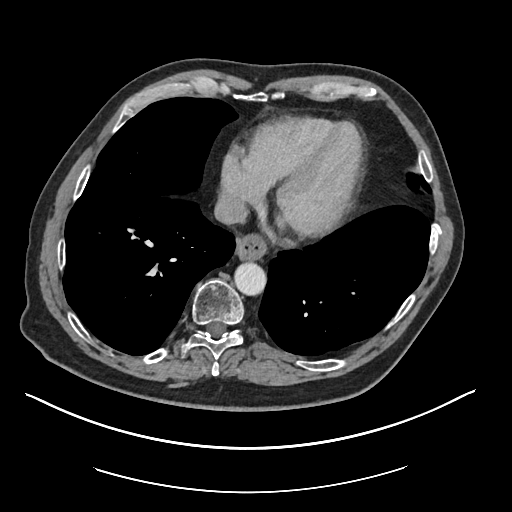
[im 74/193  lung]
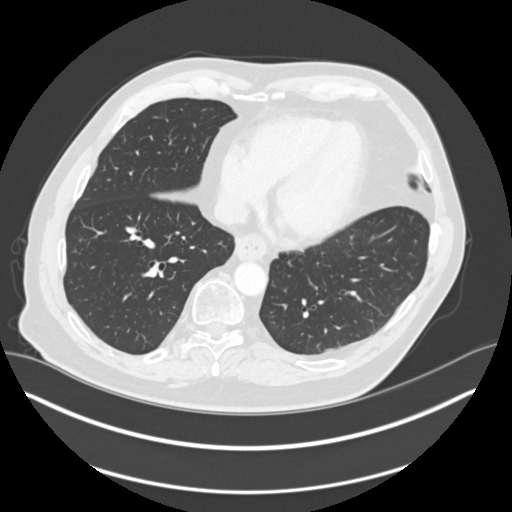
[im 104/193  lung]
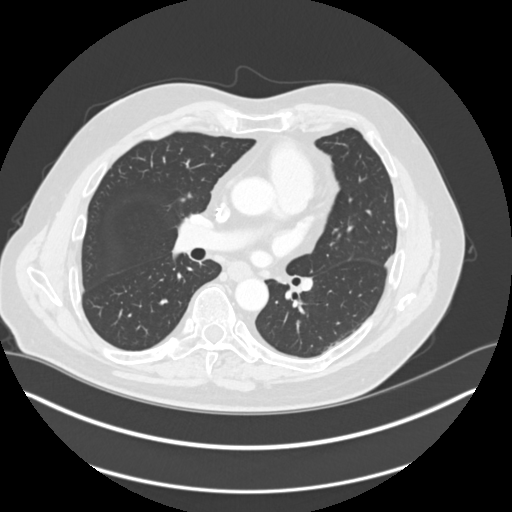
[im 119/193  lung]
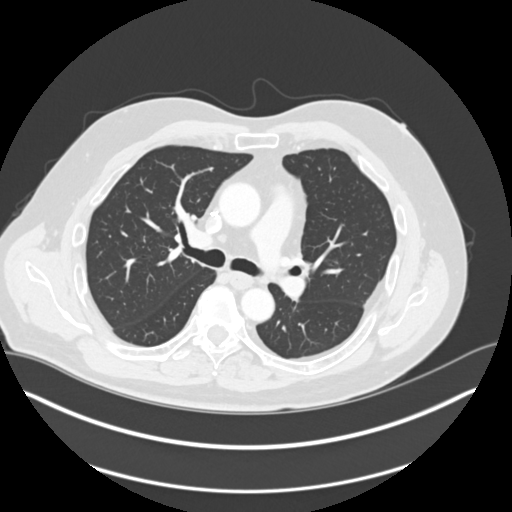
[im 133/193  lung]
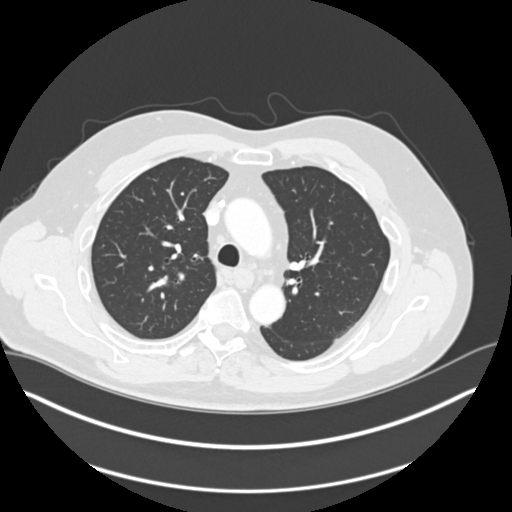
[im 148/193  mediastinal]
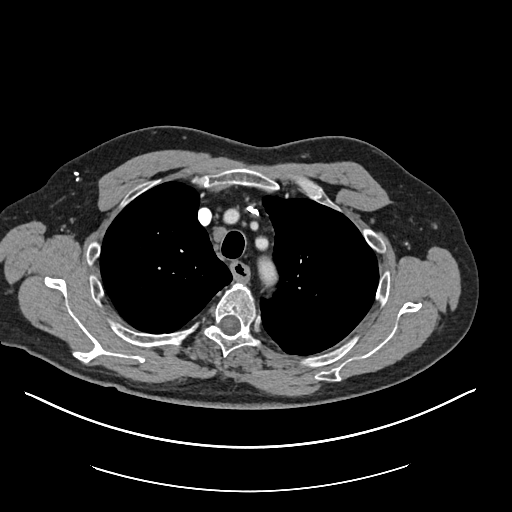
[im 148/193  lung]
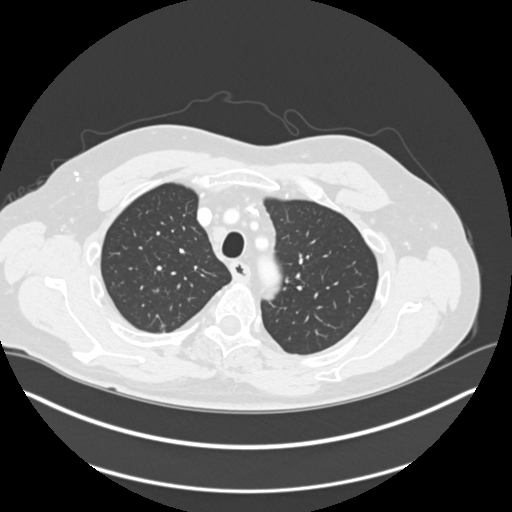
[im 163/193  lung]
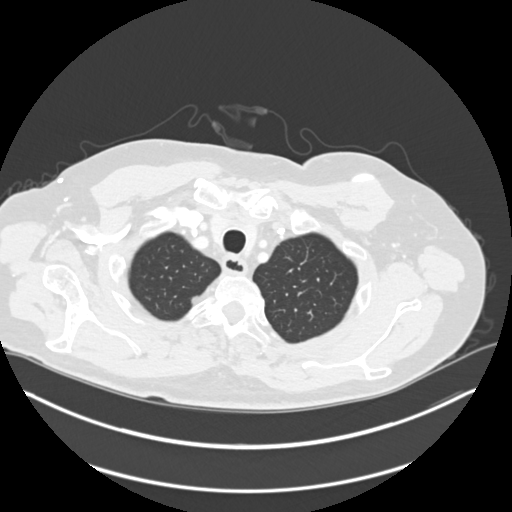
[im 178/193  lung]
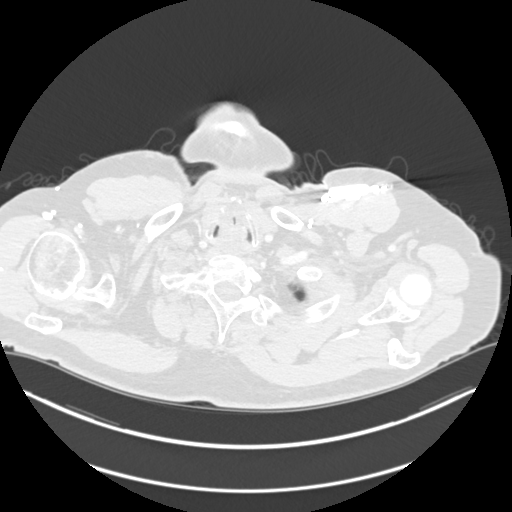

[Series 4: coronal chest 2.00 cor · coronal · 0.76mm/px · 3 of 185 slices shown]
[im 37/185  lung]
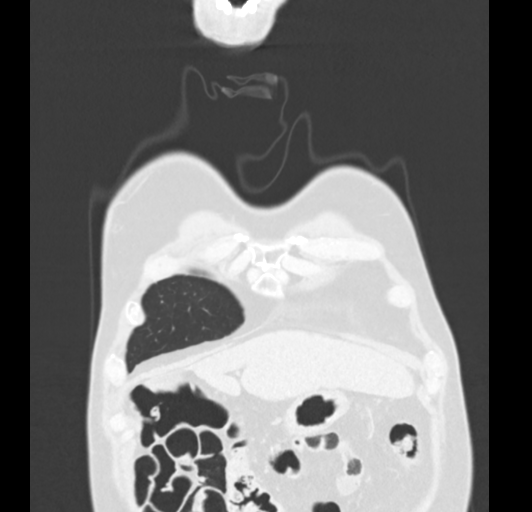
[im 74/185  lung]
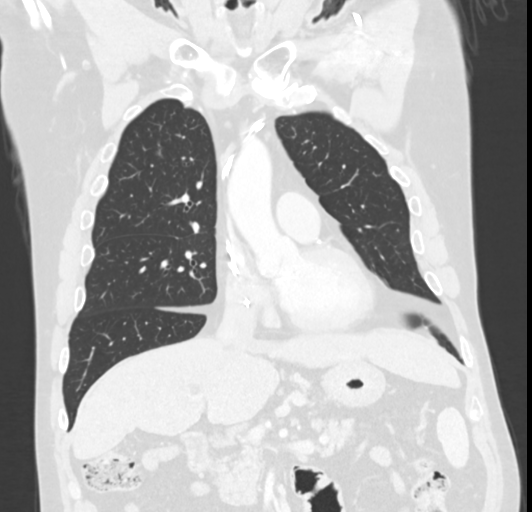
[im 111/185  lung]
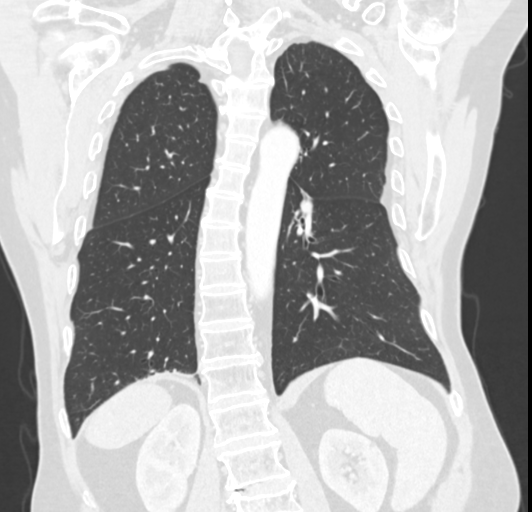

[14 of 36 positions shown; findings below may reference images not displayed]

RADIATION DOSE REDUCTION: This exam was performed according to the
departmental dose-optimization program which includes automated
exposure control, adjustment of the mA and/or kV according to
patient size and/or use of iterative reconstruction technique.

CONTRAST:  75mL OMNIPAQUE IOHEXOL 300 MG/ML  SOLN
FINDINGS: Cardiovascular: There are scattered coronary artery calcifications.
There is ectasia of main pulmonary artery measuring 3.7 cm. There
are no filling defects in the central pulmonary artery branches.
There is homogeneous enhancement in thoracic aorta. There is
interval clearing of large pericardial effusion.

Mediastinum/Nodes: There are slightly enlarged lymph nodes in the
mediastinum in the right paratracheal region and subcarinal region
measuring up to 10 mm in short axis. Mediastinal nodes appear
slightly more prominent in size. There is no significant
lymphadenopathy in the supraclavicular region.

Lungs/Pleura: There is interval clearing of patchy infiltrates in
both lower lung fields. No new focal pulmonary consolidation is
seen. In the image 47 of series 3, there is a 4 mm pleural-based
nodule in the posterior right upper lobe which may have been there
before. In image 57 there is subtle small focus of increased
interstitial markings in the anterior right upper lobe. In image
122, there is a 3 mm nodular density in the right lower lobe. In the
image 125, there is a 5 mm pleural-based nodule in the right lower
lobe. In image 124, there is a 4 mm pleural-based nodule in the
right lower lobe. In image 141, there is a 3 mm pleural-based nodule
in the right lower lobe. There is pleural thickening in the
posterior left lower lung fields. There is small linear density in
the left costophrenic angle, possibly scarring. In image 109 there
is a 4 mm nodular density in the left lower lobe. Some of the lung
nodules may have been obscured by infiltrates seen in the previous
study.

Upper Abdomen: In image 157, there is 3.5 cm low-density structure
in the anterior liver. In the same image, there is a metallic
density in the posterior aspect of liver. There is mild nodularity
in the liver surface. Spleen measures 13.7 cm in maximum diameter.
There are ectatic vessels adjacent to stomach. Possibility of portal
hypertension with portosystemic shunt is not excluded.

Musculoskeletal: There is decrease in height of upper endplate of
body of L3 vertebra. L3 vertebra is not included in its entirety
limiting evaluation.
IMPRESSION: There are enlarged lymph nodes in the mediastinum with minimal
increase in size since 06/03/2021.

There are multiple noncalcified nodules in both lungs as described
in the body of the report measuring up to 5 mm in diameter. No
follow-up needed if patient is low-risk (and has no known or
suspected primary neoplasm). Non-contrast chest CT can be considered
in 12 months if patient is high-risk. This recommendation follows
the consensus statement: Guidelines for Management of Incidental
Pulmonary Nodules Detected on CT Images: From the [HOSPITAL]

Nodularity in the liver surface suggests cirrhosis. There is 3.5 cm
low-density structure in the right lobe of liver. This may suggest
space-occupying neoplastic process or residual changes from previous
intervention. Follow-up MRI may be considered.

Enlarged spleen. Possible varices are noted adjacent to the stomach
suggesting portal hypertension.

There is decrease in height of upper endplate of body of L3 vertebra
which may suggest recent or old compression.

Other findings as described in the body of the report.

## 2022-04-19 ENCOUNTER — Ambulatory Visit: Payer: 59 | Admitting: Family

## 2022-04-19 ENCOUNTER — Encounter (INDEPENDENT_AMBULATORY_CARE_PROVIDER_SITE_OTHER): Payer: Self-pay

## 2022-04-20 ENCOUNTER — Encounter: Payer: Self-pay | Admitting: Internal Medicine

## 2022-04-20 LAB — PACEMAKER DEVICE OBSERVATION

## 2022-04-26 ENCOUNTER — Telehealth: Payer: Self-pay | Admitting: Internal Medicine

## 2022-04-26 NOTE — Telephone Encounter (Signed)
Patient is calling requesting a callback from [facility] tomorrow 11/07 after 2:00 pm regarding his last appt with Bensimhon.

## 2022-04-27 ENCOUNTER — Telehealth: Payer: Self-pay | Admitting: Internal Medicine

## 2022-04-27 NOTE — Telephone Encounter (Signed)
Previous encounter open for the same thing. Closing this encounter.

## 2022-04-27 NOTE — Telephone Encounter (Signed)
Patient was calling to talk with Dr. Caryl Comes or nurse to call back.

## 2022-04-27 NOTE — Telephone Encounter (Signed)
Attempted to call the patient. No answer- I left a message to please call back.  

## 2022-04-27 NOTE — Telephone Encounter (Signed)
Durel Salts at 04/27/2022  4:03 PM  Status: Signed  Patient was calling to talk with Dr. Caryl Comes or nurse to call back.

## 2022-04-27 NOTE — Telephone Encounter (Signed)
I spoke with the patient. He called to inquire if Dr. Haroldine Laws and Dr. Caryl Comes had a chance to talk about his visit with Dr. Haroldine Laws.   I advised the patient that Dr. Caryl Comes is currently out of the office for the next 2 weeks, but I am uncertain if he and Dr. Haroldine Laws spoke before he left.  He stated Dr. Haroldine Laws was "supposed to be checking on some things for me."  He is aware that no information has been relayed to me at this point, but I will forward a message to both docs to inquire.  He is aware I will try to reach back out to him by 05/11/22.   The patient voices understanding and is agreeable.

## 2022-05-18 ENCOUNTER — Encounter: Payer: Self-pay | Admitting: Internal Medicine

## 2022-05-21 NOTE — Telephone Encounter (Signed)
The patient is scheduled for a telehealth visit with Dr. Caryl Comes on 05/27/22 at 12:00 pm.  See 05/18/22 patient advise request.

## 2022-05-27 ENCOUNTER — Ambulatory Visit: Payer: 59 | Attending: Internal Medicine | Admitting: Internal Medicine

## 2022-05-27 DIAGNOSIS — I5032 Chronic diastolic (congestive) heart failure: Secondary | ICD-10-CM

## 2022-05-27 DIAGNOSIS — R0609 Other forms of dyspnea: Secondary | ICD-10-CM

## 2022-05-27 DIAGNOSIS — E782 Mixed hyperlipidemia: Secondary | ICD-10-CM

## 2022-05-27 DIAGNOSIS — Z950 Presence of cardiac pacemaker: Secondary | ICD-10-CM

## 2022-05-27 DIAGNOSIS — I442 Atrioventricular block, complete: Secondary | ICD-10-CM

## 2022-05-27 NOTE — Progress Notes (Signed)
Pt and I did nto connect on telehealth.

## 2022-05-31 ENCOUNTER — Telehealth: Payer: Self-pay | Admitting: Internal Medicine

## 2022-05-31 ENCOUNTER — Encounter: Payer: Self-pay | Admitting: Internal Medicine

## 2022-05-31 NOTE — Telephone Encounter (Signed)
Patient would like to speak to [facility], trying to figure out what last week to the tele med.

## 2022-06-01 NOTE — Telephone Encounter (Signed)
Dr. Caryl Comes- are you going to try reaching back out to him?

## 2022-06-02 ENCOUNTER — Encounter: Payer: Self-pay | Admitting: Internal Medicine

## 2022-06-04 ENCOUNTER — Ambulatory Visit (INDEPENDENT_AMBULATORY_CARE_PROVIDER_SITE_OTHER): Payer: 59

## 2022-06-04 DIAGNOSIS — I442 Atrioventricular block, complete: Secondary | ICD-10-CM | POA: Diagnosis not present

## 2022-06-04 LAB — CUP PACEART REMOTE DEVICE CHECK
Battery Remaining Longevity: 41 mo
Battery Remaining Percentage: 62 %
Battery Voltage: 2.98 V
Brady Statistic AP VP Percent: 34 %
Brady Statistic AP VS Percent: 1 %
Brady Statistic AS VP Percent: 65 %
Brady Statistic AS VS Percent: 1 %
Brady Statistic RA Percent Paced: 33 %
Brady Statistic RV Percent Paced: 99 %
Date Time Interrogation Session: 20231215020016
Implantable Lead Connection Status: 753985
Implantable Lead Connection Status: 753985
Implantable Lead Implant Date: 20210224
Implantable Lead Implant Date: 20210224
Implantable Lead Location: 753859
Implantable Lead Location: 753860
Implantable Lead Model: 7842
Implantable Lead Serial Number: 1032895
Implantable Pulse Generator Implant Date: 20210224
Lead Channel Impedance Value: 390 Ohm
Lead Channel Impedance Value: 510 Ohm
Lead Channel Pacing Threshold Amplitude: 1.125 V
Lead Channel Pacing Threshold Amplitude: 1.5 V
Lead Channel Pacing Threshold Pulse Width: 1 ms
Lead Channel Pacing Threshold Pulse Width: 1 ms
Lead Channel Sensing Intrinsic Amplitude: 1.9 mV
Lead Channel Sensing Intrinsic Amplitude: 6.1 mV
Lead Channel Setting Pacing Amplitude: 1.375
Lead Channel Setting Pacing Amplitude: 3.5 V
Lead Channel Setting Pacing Pulse Width: 1 ms
Lead Channel Setting Sensing Sensitivity: 2 mV
Pulse Gen Model: 2272
Pulse Gen Serial Number: 9197143

## 2022-06-07 ENCOUNTER — Telehealth: Payer: Self-pay | Admitting: Internal Medicine

## 2022-06-07 ENCOUNTER — Telehealth: Payer: Self-pay

## 2022-06-07 ENCOUNTER — Other Ambulatory Visit: Payer: Self-pay | Admitting: Vascular Surgery

## 2022-06-07 DIAGNOSIS — I82451 Acute embolism and thrombosis of right peroneal vein: Secondary | ICD-10-CM

## 2022-06-07 NOTE — Telephone Encounter (Signed)
error 

## 2022-06-07 NOTE — Telephone Encounter (Signed)
Patient states he sees [facility] for his cirrhosis and see [place] for everything else. Informed patient that he could not have two GI doctors and he states yes he can it is his choice. Informed him I would have to ask the provider if they could see him and see if they will give you any advice. Patient states last week [facility] put him on Eliquis '10mg'$  twice a day and since then he has been having heavy rectal bleeding. He states it is even with not having a bowel movement. He states he had clots on Friday Saturday and Sunday. He called [facility] today and they said they wanted him on this does for at least 4 more days but he needed to go to the ER for the rectal bleeding. Informed patient if he was bleeding that bad and [facility] told him to go to the ER then he needed to go to the ER. He states he is not going to the ER to be seen he states he needs to be seen in our office. He states today he is still having rectal bleeding with some clots. He denies any dizziness or any other symptoms.

## 2022-06-07 NOTE — Telephone Encounter (Signed)
Reviewed his chart.  I saw him in 09/2020 for symptomatic hemorrhoids.  He has cirrhosis and on Eliquis.  I cannot perform hemorrhoid banding on Eliquis.  I have discussed with him in the past that we can refer him to vascular surgery in [facility] for embolization.  If patient is agreeable, please refer him to them and let me know.  I will reach out to them via email  Thanks  Loyd Marhefka

## 2022-06-07 NOTE — Telephone Encounter (Signed)
Pt is calling to speak to [name], RN in regards to MyChart message on 12/11. Requesting call back.

## 2022-06-07 NOTE — Telephone Encounter (Signed)
Attempted to call the patient. No answer- I left a detailed message advising him that I sent him a Mychart message back earlier today offering an appointment on 12/21 at 12:20 pm with Dr. Caryl Comes.  I advised her read that message and send me a response back or he can try to call me in the office tomorrow.

## 2022-06-07 NOTE — Telephone Encounter (Signed)
Patient left a voicemail on my phone because he wanted to make a appointment with Dr. Allen Norris or Dr. Marius Ditch. When reviewing his chart it looks like he has been seeing [facility]. Called and left a message for call back to find out more information to why he needed to be seen by our office

## 2022-06-08 NOTE — Telephone Encounter (Signed)
Patient states he does not want to have a referral to vascular surgery in [place] because he is not concern about his hemorrhoids. Patient states he is just reaching out because [facility] and vascular told him to or go to the ER. Informed patient that Dr. Marius Ditch recommends we refer you and he said he is not going to see them. Patient asked if he could have a appointment today with Dr. Marius Ditch informed patient no because her schedule is full. Our next available would be April and I could put him on the wait list. Patient said okay he does not want to make a appointment and disconnected the phone

## 2022-06-08 NOTE — Telephone Encounter (Signed)
Message received from Dr. Caryl Comes today stating he called the patient and no need for a virtual visit on 12/21.

## 2022-06-09 NOTE — Telephone Encounter (Signed)
Spoke with him 12/19  interval popliteal DVT >> filter  and anticoagulation  Not able to get insurance coverage to go to [facility]  working on that

## 2022-06-18 ENCOUNTER — Ambulatory Visit: Payer: 59

## 2022-06-24 NOTE — Progress Notes (Signed)
Remote pacemaker transmission.   

## 2022-07-01 ENCOUNTER — Telehealth: Payer: Self-pay | Admitting: Internal Medicine

## 2022-07-01 NOTE — Telephone Encounter (Signed)
I called and spoke with the patient.  He is scheduled to see Dr. Caryl Comes in February, but wanted to know if that was needed. I reviewed the patient's chart.  His February appointment was scheduled from a check out in August 2023 with Dr. Caryl Comes. He ended up coming in an October to be seen and Dr. Caryl Comes advised at that visit he would be a 6 month follow up.  The patient is currently without complaints and would like to push out his follow up to April/ May 2024 with Dr. Caryl Comes.  I have r/s him to 09/28/22 at 10:00am.  The patient voices understanding and is agreeable.

## 2022-07-01 NOTE — Telephone Encounter (Signed)
Patient is requesting to speak with [name], RN, directly to discuss why an in office device check is necessary if everything is already being monitored remotely. He is wanting to cancel, but he would like to speak with RN prior if possible.

## 2022-07-02 ENCOUNTER — Ambulatory Visit: Admission: RE | Admit: 2022-07-02 | Payer: 59 | Source: Ambulatory Visit

## 2022-07-09 ENCOUNTER — Ambulatory Visit: Payer: 59

## 2022-07-14 ENCOUNTER — Ambulatory Visit: Payer: 59

## 2022-07-16 ENCOUNTER — Ambulatory Visit: Payer: 59

## 2022-07-22 ENCOUNTER — Ambulatory Visit
Admission: RE | Admit: 2022-07-22 | Discharge: 2022-07-22 | Disposition: A | Discharge status: Home / Self Care | Payer: 59 | Source: Ambulatory Visit | Attending: Vascular Surgery | Admitting: Vascular Surgery

## 2022-07-22 DIAGNOSIS — I82451 Acute embolism and thrombosis of right peroneal vein: Secondary | ICD-10-CM

## 2022-07-23 ENCOUNTER — Telehealth: Payer: Self-pay | Admitting: Internal Medicine

## 2022-07-23 NOTE — Telephone Encounter (Signed)
I spoke with the patient. He advised that in mid December he was being assessed for venous insufficiency. He was sent for a venous scan on 12/12 or 12/14 and was diagnosed with a DVT- he is on currently on Eliquis for this. IVC filter was placed.  07/22/22- He had a repeat venous scan that showed no DVT.  The patient advised that is also having a large amount of lower extremity edema bilaterally with a 9 lb weight gain in 6 weeks.  The patient denies any SOB or abdominal fullness.  He states he has also reached out to Vascular Surgery at [facility] to discuss this as well. The patient mentioned he recently saw his PCP and they also set him up for ABI's for the swelling, but these were negative.   I advised the patient that I am uncertain what may be causing his bilateral lower extremity edema or weight gain at this time considering his overall medical history.  I have advised the patient that his last echo done on 01/05/22 through his PCP showed an EF of 55-65%.   He does not currently take any diuretics.  I have advised the patient that I will forward to Dr. Caryl Comes for further review and any other recommendations.  He is aware I will call back with further MD recommendations once received.   The patient voices understanding and is agreeable.

## 2022-07-23 NOTE — Telephone Encounter (Signed)
Pt asking to speak to [name], RN about his heart health.

## 2022-07-26 ENCOUNTER — Other Ambulatory Visit: Payer: Self-pay | Admitting: Vascular Surgery

## 2022-07-26 ENCOUNTER — Encounter: Payer: Self-pay | Admitting: Internal Medicine

## 2022-07-26 ENCOUNTER — Encounter: Payer: Self-pay | Admitting: Vascular Surgery

## 2022-07-26 DIAGNOSIS — I82511 Chronic embolism and thrombosis of right femoral vein: Secondary | ICD-10-CM

## 2022-07-26 DIAGNOSIS — I82431 Acute embolism and thrombosis of right popliteal vein: Secondary | ICD-10-CM

## 2022-07-26 NOTE — Telephone Encounter (Signed)
I called and spoke with the patient.  I clarified with him that he currently still has his IVC filter in place.  Per the patient, he had an ultrasound on Friday (2/2) that showed no indication of a clot per his report.  He also advised the [facility] has placed an order for a CT of his abdomen to have done at [facility]. He states he has to call to schedule this.  I informed the patient that Dr. Caryl Comes advised there may be some type of occlusion in the IVC filter that could be contributing to his swelling. Per the patient, he will call to schedule his CT scan.  I have also advised him that per Dr. Caryl Comes, he could try a short term course of furosemide 20 mg once daily x 7 days to see if he has any improvement in his swelling.  Per the patient, he stopped by his PCP office on Friday afternoon and asked if they could weigh him and his weight was down 3 lbs from the day prior. He advised he was concerned that the scale may be incorrect as he had not had any significant urine output overnight and he weighted about the same time of day with the same amount of clothing on.   I advised the patient: - to please call to have his CT done per [facility] - let me know if he, though MyChart, if he would like to proceed with taking furosemide 20 mg once daily x 7 days, as he prefers to hold on this today.  The patient voices understanding of the above and is agreeable.

## 2022-07-26 NOTE — Telephone Encounter (Signed)
Patient returned call

## 2022-07-26 NOTE — Telephone Encounter (Signed)
Deboraha Sprang, MD [phone])  Sent: Sat July 24, 2022 10:13 AM  To: Emily Filbert, RN         Message  H  good morning    Could you find out if the filter has been taken out, one possibility is that the IVC filter is occluded  We can also start him on furosemide 20 mg for 7 days  And see what happens  Thanks SK

## 2022-07-26 NOTE — Telephone Encounter (Signed)
Attempted to call the patient with MD recommendations. No answer- I left a message to please call back.

## 2022-08-05 ENCOUNTER — Ambulatory Visit
Admission: RE | Admit: 2022-08-05 | Discharge: 2022-08-05 | Disposition: A | Discharge status: Home / Self Care | Payer: 59 | Source: Ambulatory Visit | Attending: Vascular Surgery | Admitting: Vascular Surgery

## 2022-08-05 DIAGNOSIS — I82511 Chronic embolism and thrombosis of right femoral vein: Secondary | ICD-10-CM | POA: Diagnosis present

## 2022-08-05 DIAGNOSIS — I82431 Acute embolism and thrombosis of right popliteal vein: Secondary | ICD-10-CM | POA: Diagnosis present

## 2022-08-05 MED ORDER — IOHEXOL 350 MG/ML SOLN
100.0000 mL | Freq: Once | INTRAVENOUS | Status: AC | PRN
  Administered 2022-08-05: 100 mL via INTRAVENOUS

## 2022-08-09 ENCOUNTER — Ambulatory Visit: Admit: 2022-08-09 | Discharge: 2022-08-10 | Payer: MEDICARE

## 2022-08-10 ENCOUNTER — Encounter: Payer: 59 | Admitting: Internal Medicine

## 2022-08-17 ENCOUNTER — Other Ambulatory Visit: Payer: Self-pay | Admitting: Interventional Radiology

## 2022-08-17 ENCOUNTER — Other Ambulatory Visit: Payer: Self-pay

## 2022-08-17 DIAGNOSIS — Z95828 Presence of other vascular implants and grafts: Secondary | ICD-10-CM

## 2022-08-18 ENCOUNTER — Other Ambulatory Visit: Payer: Self-pay

## 2022-08-18 ENCOUNTER — Other Ambulatory Visit: Payer: Self-pay | Admitting: Interventional Radiology

## 2022-08-18 ENCOUNTER — Telehealth: Payer: Self-pay

## 2022-08-18 ENCOUNTER — Ambulatory Visit
Admission: RE | Admit: 2022-08-18 | Discharge: 2022-08-18 | Disposition: A | Discharge status: Home / Self Care | Payer: 59 | Source: Ambulatory Visit | Attending: Interventional Radiology | Admitting: Interventional Radiology

## 2022-08-18 DIAGNOSIS — Z95828 Presence of other vascular implants and grafts: Secondary | ICD-10-CM

## 2022-08-18 DIAGNOSIS — K7460 Unspecified cirrhosis of liver: Secondary | ICD-10-CM

## 2022-08-18 DIAGNOSIS — I82409 Acute embolism and thrombosis of unspecified deep veins of unspecified lower extremity: Secondary | ICD-10-CM

## 2022-08-18 HISTORY — PX: IR RADIOLOGIST EVAL & MGMT: IMG5224

## 2022-08-18 NOTE — Telephone Encounter (Signed)
Pt lmovm requesting a return call  I called pt and he is overdue for an EGD for cirrhosis, rule out esophageal varices  Procedure scheduled

## 2022-08-18 NOTE — H&P (Signed)
Interventional Radiology - Telephone Visit    History of Present Illness  Devin Hale is a 72 y.o. male with a relevant history of venous insuffiencey and DVT s/p IVC filter placement, seen in telephone visit for IVC filter retrieval.  We confirmed identity with 2 personal identifiers.   Patient reports he had a right lower extremity calf DVT was incidentally diagnosed in December 2023.  He was started on Eliquis and an IVC filter was placed at [facility].  He has been seen in follow-up at [place] and [facility] and [facility], and was referred to me for further evaluation and management of this IVC filter and possible removal.    Patient reports that he has been on Eliquis since December 2023 and has had no major bleeding complications.  He reports an intermittent bloody nose, but otherwise no upper or lower GI bleeding.  DVT ultrasound on July 22, 2022 was negative for DVT.  CT venogram abdomen and pelvis on August 05, 2022 demonstrated appropriate positioning of a retrievable IVC filter in an infrarenal location without complicating feature.    His relevant past medical history significant for atrial fibrillation status post pacemaker placement.  He reports that he was on Eliquis for atrial fibrillation in the past, although was discontinued prior to his most recent DVT episode.    Past medical and surgical history reviewed. No interval changes. No interval hospitalizations.   Medications  I have reviewed the current medication list. Refer to chart for details. Current Outpatient Medications  Medication Instructions   ascorbic acid (VITAMIN C) 500 mg, Oral, 2 times daily   aspirin EC 81 mg, Oral, Daily, Swallow whole.   B Complex-Biotin-FA (VITAMIN B50 COMPLEX PO) Oral, Daily   cholecalciferol (VITAMIN D3) 1,000 Units, Oral, 2 times daily   Co Q 10 100 mg, Oral, Daily   magnesium oxide (MAG-OX) 200 mg, Oral, 2 times daily   metroNIDAZOLE (METROGEL) 1 % gel 1 Application, Topical,  Daily PRN   mometasone (NASONEX) 50 MCG/ACT nasal spray 2 sprays, Nasal, Daily   Multiple Vitamin (MULTIVITAMIN WITH MINERALS) TABS tablet 1 tablet, Oral, Daily   Omega-3 Fatty Acids (FISH OIL) 1000 MG CAPS 2 capsules, Oral, Daily   predniSONE (DELTASONE) 10 mg, Oral, Daily   Prolia 60 mg, Subcutaneous, Every 6 months   vitamin E 200 Units, Oral, Daily   Whey Protein POWD Oral, Daily       Pertinent Lab Results    Latest Ref Rng & Units 04/07/2020    1:36 PM 10/02/2019    8:21 AM 09/02/2019    3:34 PM  CBC  WBC 4.0 - 10.5 K/uL 7.0  10.9  6.4   Hemoglobin 13.0 - 17.0 g/dL 14.4  14.5  13.2   Hematocrit 39.0 - 52.0 % 41.7  43.3  40.6   Platelets 150 - 400 K/uL 104  165  197       Latest Ref Rng & Units 10/05/2021   11:05 AM 04/07/2020    1:36 PM 10/02/2019    8:21 AM  CMP  Glucose 70 - 99 mg/dL  110  136   BUN 8 - 23 mg/dL  15  21   Creatinine 0.61 - 1.24 mg/dL 0.90  0.99  0.89   Sodium 135 - 145 mmol/L  132  136   Potassium 3.5 - 5.1 mmol/L  4.0  4.5   Chloride 98 - 111 mmol/L  97  104   CO2 22 - 32 mmol/L  24  25  Calcium 8.9 - 10.3 mg/dL  9.4  9.2   Total Protein 6.5 - 8.1 g/dL  7.7    Total Bilirubin 0.3 - 1.2 mg/dL  2.1    Alkaline Phos 38 - 126 U/L  62    AST 15 - 41 U/L  86    ALT 0 - 44 U/L  136       Relevant and/or Recent Imaging: CTV AP 08/05/2022  IMPRESSION: 1. IVC is patent. IVC filter is positioned below the renal veins without complicating features. 2. 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 These varices are supplied from the left gastric vein and probably draining via a gastrorenal shunt. Main portal venous system is patent without thrombus. 3. Cirrhosis with evidence of portal hypertension. 4. 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 Subtle hypodensities in the liver are nonspecific on this examination. Patient had a previous MRI at [facility] on 03/15/2022 and recommend continued surveillance for hepatocellular  carcinoma. 5. Enlarged prostate with bladder diverticula and chronic hyperdensity or enhancement at the right ureterovesical junction. Consider urologic consultation if this has not already been evaluated. 6. Multiple small pulmonary nodules. Many of these nodules appear to be stable. Some of the nodules are age indeterminate. No follow-up needed if patient is low-risk (and has no known or suspected primary neoplasm). Non-contrast chest CT can be considered in 12 months if patient is high-risk. This recommendation follows the consensus statement: Guidelines for Management of Incidental Pulmonary Nodules Detected on CT Images: From the Fleischner Society 2017; Radiology 2017; 284:228-243. Electronically Signed   By: Markus Daft M.D.   On: 08/06/2022 09:23  DVT US 07/22/2022  IMPRESSION: Negative.  Electronically Signed   By: Marijo Conception M.D.   On: 07/22/2022 15:18    Assessment & Plan Devin Hale is a 72 y.o. male with a relevant history of right lower extremity DVT, seen in telephone visit for possible filter retrieval.    He is tolerating oral anticoagulation, and recent imaging show resolution of his RLE DVT and an IVC filter in good position.   He is an appropriate candidate for IVC filter retrieval, and is agreeable to proceed after discussion of benefits and risks.   Plan:  1. IVC filter retrieval  - [facility] (same day discharge)  - Moderate sedation  - Right IJ access  - No medication holds (may continue Eliquis)      I spent a total of 40 Minutes in face-to-face in clinical consultation, greater than 50% of which was spent on medical decision-making and counseling/coordinating care for IVC filter.   Visit type: Audio only (telephone). Audio (no video) only due to patient's lack of internet/smartphone capability. Alternative for in-person consultation at [facility], [address] [place], [place], [place]. This visit type was conducted due to national  recommendations for restrictions regarding the COVID-19 Pandemic (e.g. social distancing).  This format is felt to be most appropriate for this patient at this time.  All issues noted in this document were discussed and addressed.      Albin Felling, MD  Vascular and Interventional Radiology 08/18/2022 9:50 AM

## 2022-08-25 ENCOUNTER — Telehealth: Payer: Self-pay

## 2022-08-25 NOTE — Telephone Encounter (Signed)
Received staff message from [facility] stating patient needed instructions for EGD.  EGD scheduled 10/07/22 at [facility]. Returned his phone call, however no answer.  Left voice message for him to call back.  Thanks, [place], [place]

## 2022-08-27 ENCOUNTER — Other Ambulatory Visit: Payer: Self-pay | Admitting: Internal Medicine

## 2022-08-27 DIAGNOSIS — I82461 Acute embolism and thrombosis of right calf muscular vein: Secondary | ICD-10-CM

## 2022-08-27 NOTE — Telephone Encounter (Signed)
Patient has been advised that he does not need someone to stay with him throughout the entire process of his EGD, however he will need someone to bring him to the procedure and be able to drive him home afterwards.    Transportation discussed-inquired about [place] [facility] [place] to inquire if [place] could be used. She has advised that [facility] can be used to transport him to and from his procedure.  Since patient will be using [place] for transportation-will look into having a set time scheduled for his procedure in advance.  Pt inquired as to why he could not have his procedure at [facility].  It was explained to him that he is unable to have his procedure at [facility] due to pace maker and cardiac.   Reminded him to contact office to reschedule within 3 days of procedure date for courtesy.  Thanks, [place], [place]

## 2022-08-27 NOTE — Progress Notes (Signed)
Patient for IR IVC Filter Retrieval on Mon 08/30/2022, I called and spoke with the patient on the phone and gave pre-procedure instructions. Pt was made aware to be here at 12:30p, NPO after MN prior to procedure as well as driver post procedure/recovery/discharge. Pt stated understanding.  Called 08/25/22

## 2022-08-30 ENCOUNTER — Ambulatory Visit
Admission: RE | Admit: 2022-08-30 | Discharge: 2022-08-30 | Disposition: A | Discharge status: Home / Self Care | Payer: 59 | Source: Ambulatory Visit | Attending: Interventional Radiology | Admitting: Interventional Radiology

## 2022-08-30 ENCOUNTER — Encounter: Payer: Self-pay | Admitting: Radiology

## 2022-08-30 ENCOUNTER — Other Ambulatory Visit: Payer: Self-pay

## 2022-08-30 DIAGNOSIS — Z4589 Encounter for adjustment and management of other implanted devices: Secondary | ICD-10-CM | POA: Diagnosis present

## 2022-08-30 DIAGNOSIS — Z950 Presence of cardiac pacemaker: Secondary | ICD-10-CM | POA: Insufficient documentation

## 2022-08-30 DIAGNOSIS — I82461 Acute embolism and thrombosis of right calf muscular vein: Secondary | ICD-10-CM

## 2022-08-30 DIAGNOSIS — I4891 Unspecified atrial fibrillation: Secondary | ICD-10-CM | POA: Insufficient documentation

## 2022-08-30 DIAGNOSIS — I442 Atrioventricular block, complete: Secondary | ICD-10-CM | POA: Diagnosis not present

## 2022-08-30 DIAGNOSIS — Z86718 Personal history of other venous thrombosis and embolism: Secondary | ICD-10-CM | POA: Diagnosis not present

## 2022-08-30 DIAGNOSIS — I82409 Acute embolism and thrombosis of unspecified deep veins of unspecified lower extremity: Secondary | ICD-10-CM

## 2022-08-30 HISTORY — PX: IR IVC FILTER RETRIEVAL / S&I /IMG GUID/MOD SED: IMG5308

## 2022-08-30 MED ORDER — SODIUM CHLORIDE 0.9 % IV SOLN: INTRAVENOUS | Status: DC

## 2022-08-30 MED ORDER — MIDAZOLAM HCL 2 MG/2ML IJ SOLN
INTRAMUSCULAR | Status: AC | PRN
  Administered 2022-08-30: 1 mg via INTRAVENOUS

## 2022-08-30 MED ORDER — MIDAZOLAM HCL 2 MG/2ML IJ SOLN
INTRAMUSCULAR | Status: AC
  Filled 2022-08-30: qty 4

## 2022-08-30 MED ORDER — LIDOCAINE HCL 1 % IJ SOLN
INTRAMUSCULAR | Status: AC
  Administered 2022-08-30: 8 mL
  Filled 2022-08-30: qty 20

## 2022-08-30 MED ORDER — FENTANYL CITRATE (PF) 100 MCG/2ML IJ SOLN
INTRAMUSCULAR | Status: AC | PRN
  Administered 2022-08-30: 50 ug via INTRAVENOUS

## 2022-08-30 MED ORDER — FENTANYL CITRATE (PF) 100 MCG/2ML IJ SOLN
INTRAMUSCULAR | Status: AC
  Filled 2022-08-30: qty 2

## 2022-08-30 MED ORDER — IOHEXOL 300 MG/ML  SOLN
60.0000 mL | Freq: Once | INTRAMUSCULAR | Status: AC | PRN
  Administered 2022-08-30: 60 mL via INTRAVENOUS

## 2022-08-30 NOTE — H&P (Signed)
Chief Complaint: Patient was seen in consultation today for IVC filter retrieval  Referring Physician(s): Juliet Rude  Supervising Physician: Michaelle Birks  Patient Status: [facility] - Out-pt  History of Present Illness: Devin Hale is a 72 y.o. male with PMH significant for atrial fibrillation, complete heart block, hepatitis C, presence of cardiac pacemaker, and history of incidental DVT of right calf diagnosed in December 2023. Patient's DVT was found by team at [facility], and IVC filter was placed by [facility] team as well. Patient was subsequently referred to [facility] Radiology for IVC filter retrieval secondary to patient request. CT venogram abdomen and pelvis from 08/05/22 showed appropriate positioning of retrievable IVC filter. Patient had appointment in February 2024 with Dr Denna Haggard for evaluation. At this appointment, patient was approved for image-guided IVC filter retrieval with [facility].   Past Medical History:  Diagnosis Date   Complete heart block ([place])    Hepatitis    Hepatitis C    Pacemaker     Past Surgical History:  Procedure Laterality Date   COLONOSCOPY WITH PROPOFOL N/A 05/27/2020   Procedure: COLONOSCOPY WITH PROPOFOL;  Surgeon: Lucilla Lame, MD;  Location: [facility] ENDOSCOPY;  Service: Endoscopy;  Laterality: N/A;   ESOPHAGOGASTRODUODENOSCOPY (EGD) WITH PROPOFOL N/A 05/27/2020   Procedure: ESOPHAGOGASTRODUODENOSCOPY (EGD) WITH PROPOFOL;  Surgeon: Lucilla Lame, MD;  Location: [facility] ENDOSCOPY;  Service: Endoscopy;  Laterality: N/A;   IR RADIOLOGIST EVAL & MGMT  08/18/2022   KNEE ARTHROSCOPY Left 1982   LIVER BIOPSY     NASAL SINUS SURGERY  2019   TEMPORARY PACEMAKER N/A 08/13/2019   Procedure: TEMPORARY PACEMAKER;  Surgeon: [name], MD;  Location: [place] CV LAB;  Service: Cardiovascular;  Laterality: N/A;   TONSILLECTOMY      Allergies: Patient has no known allergies.  Medications: Prior to Admission medications   Medication Sig Start  Date End Date Taking? Authorizing Provider  ascorbic acid (VITAMIN C) 500 MG tablet Take 500 mg by mouth 2 (two) times daily.    Yes [provider]  B Complex-Biotin-FA (VITAMIN B50 COMPLEX PO) Take by mouth daily.   Yes [provider]  cholecalciferol (VITAMIN D3) 25 MCG (1000 UNIT) tablet Take 1,000 Units by mouth in the morning and at bedtime.    Yes [provider]  Coenzyme Q10 (CO Q 10) 100 MG CAPS Take 100 mg by mouth daily.   Yes [provider]  magnesium oxide (MAG-OX) 400 MG tablet Take 200 mg by mouth 2 (two) times daily.   Yes [provider]  Multiple Vitamin (MULTIVITAMIN WITH MINERALS) TABS tablet Take 1 tablet by mouth daily.   Yes [provider]  Omega-3 Fatty Acids (FISH OIL) 1000 MG CAPS Take 2 capsules by mouth daily.   Yes [provider]  predniSONE (DELTASONE) 10 MG tablet Take 10 mg by mouth daily. 03/26/21  Yes [provider]  vitamin E 200 UNIT capsule Take 200 Units by mouth daily.   Yes [provider]  Whey Protein POWD Take by mouth daily.   Yes [provider]  aspirin EC 81 MG tablet Take 1 tablet (81 mg total) by mouth daily. Swallow whole. 03/18/21   Deboraha Sprang, MD  denosumab (PROLIA) 60 MG/ML SOSY injection Inject 60 mg into the skin every 6 (six) months.    [provider]  metroNIDAZOLE (METROGEL) 1 % gel Apply 1 Application topically daily as needed.    [provider]  mometasone (NASONEX) 50 MCG/ACT nasal spray  Place 2 sprays into the nose daily. 04/20/22   [provider]     Family History  Problem Relation Age of Onset   Non-Hodgkin's lymphoma Mother    Heart attack Father     Social History   Socioeconomic History   Marital status: Single    Spouse name: Not on file   Number of children: Not on file   Years of education: Not on file   Highest education level: Not on file  Occupational History   Not on file  Tobacco  Use   Smoking status: Never   Smokeless tobacco: Never  Vaping Use   Vaping Use: Never used  Substance and Sexual Activity   Alcohol use: No   Drug use: Never   Sexual activity: Not on file  Other Topics Concern   Not on file  Social History Narrative   Not on file   Social Determinants of Health   Financial Resource Strain: Not on file  Food Insecurity: Not on file  Transportation Needs: Not on file  Physical Activity: Not on file  Stress: Not on file  Social Connections: Not on file    Code Status: Full code  Review of Systems: A 12 point ROS discussed and pertinent positives are indicated in the HPI above.  All other systems are negative.  Review of Systems  Constitutional:  Negative for chills and fever.  Respiratory:  Negative for chest tightness and shortness of breath.   Cardiovascular:  Positive for leg swelling. Negative for chest pain.  Gastrointestinal:  Negative for abdominal pain, diarrhea, nausea and vomiting.  Neurological:  Negative for dizziness and headaches.  Psychiatric/Behavioral:  Negative for confusion.     Vital Signs: BP 121/74 (BP Location: Right Arm)   Pulse 64   Temp 97.8 F (36.6 C) (Oral)   Resp 19   Ht '5\' 10"'$  (1.778 m)   Wt 175 lb (79.4 kg)   SpO2 100%   BMI 25.11 kg/m    Physical Exam Vitals reviewed.  Constitutional:      General: He is not in acute distress.    Appearance: He is not ill-appearing.  HENT:     Mouth/Throat:     Mouth: Mucous membranes are moist.  Cardiovascular:     Rate and Rhythm: Normal rate and regular rhythm.     Pulses: Normal pulses.     Heart sounds: Normal heart sounds.  Pulmonary:     Effort: Pulmonary effort is normal.     Breath sounds: Normal breath sounds.  Abdominal:     General: Bowel sounds are normal.     Palpations: Abdomen is soft.     Tenderness: There is no abdominal tenderness.  Musculoskeletal:     Right lower leg: Edema present.     Left lower leg: Edema present.  Skin:     General: Skin is warm and dry.  Neurological:     Mental Status: He is alert and oriented to person, place, and time.  Psychiatric:        Mood and Affect: Mood normal.        Behavior: Behavior normal.     Imaging: IR Radiologist Eval & Mgmt  Result Date: 08/18/2022 EXAM: NEW PATIENT OFFICE VISIT CHIEF COMPLAINT: Documented in the EMR HISTORY OF PRESENT ILLNESS: Patient reports he had a right lower extremity calf DVT was incidentally diagnosed in December 2023. He was started on Eliquis and an IVC filter was placed at [facility]. He has been seen in  follow-up at [place] and vascular and [facility] hematology, and was referred to me for further evaluation and management of this IVC filter and possible removal. Patient reports that he has been on Eliquis since December 2023 and has had no major bleeding complications. He reports an intermittent bloody nose, but otherwise no upper or lower GI bleeding. DVT ultrasound on July 22, 2022 was negative for DVT. CT venogram abdomen and pelvis on August 05, 2022 demonstrated appropriate positioning of a retrievable IVC filter in an infrarenal location without complicating feature. His relevant past medical history significant for atrial fibrillation status post pacemaker placement. He reports that he was on Eliquis for atrial fibrillation in the past, although was discontinued prior to his most recent DVT episode. REVIEW OF SYSTEMS: Documented in the EMR PHYSICAL EXAMINATION: Documented in the EMR ASSESSMENT AND PLAN: Documented in the EMR Electronically Signed   By: Albin Felling M.D.   On: 08/18/2022 10:04   CT VENOGRAM ABD/PEL  Result Date: 08/06/2022 CLINICAL DATA:  72 year old with history of DVT and IVC filter placement. Evaluate IVC and filter. History of hepatocellular carcinoma and previous liver directed therapies. EXAM: CT VENOGRAM ABDOMEN AND PELVIS TECHNIQUE: Multiplanar images of the abdomen and pelvis were obtained following the administration of  intravenous contrast. Images were obtained during the venous phase. RADIATION DOSE REDUCTION: This exam was performed according to the departmental dose-optimization program which includes automated exposure control, adjustment of the mA and/or kV according to patient size and/or use of iterative reconstruction technique. CONTRAST:  168m OMNIPAQUE IOHEXOL 350 MG/ML SOLN COMPARISON:  CT abdomen and pelvis 04/07/2020 and PET-CT 06/03/2021 FINDINGS: Lower chest: Again noted are multiple small pulmonary nodules at the lung bases. Stable 3 mm subpleural nodule in the right lower lobe on image 7/9 which is not significantly changed since the PET-CT on 05/24/2021. There are additional subtle small nodules in left lower lobe that are age indeterminate. No large pleural effusions. Partial visualization of the cardiac pacer wires. Hepatobiliary: Liver has a nodular component with enlargement of left hepatic lobe. Findings are compatible with known cirrhosis. Again noted are metallic densities in the right hepatic lobe. Again noted is a oval-shaped low-density structure in right hepatic lobe that measures up to 3.0 cm and compatible with previous ablation site. No significant enhancement around the old ablation site. Subtle low-density near the metallic densities in the right hepatic lobe on image 12/2 and this area appears to be slightly smaller than it was in 2021. However, there may be a new hypodensity in the right hepatic lobe on image 14/2 that measures roughly 1.0 cm. Punctate hypodensities at the hepatic dome are nonspecific. Gallbladder is mildly distended without inflammatory changes. No intrahepatic biliary dilatation. Pancreas: Unremarkable. No pancreatic ductal dilatation or surrounding inflammatory changes. Spleen: Spleen is prominent for size and similar to the prior examinations. Adrenals/Urinary Tract: Normal adrenal glands. Normal appearance of both kidneys without stones, hydronephrosis or suspicious renal  lesions. Indentation along the base of the bladder is probably related to an enlarged prostate median lobe. In addition, there appears to be enhancement or hyperdensity involving the distal right ureter at the right ureterovesical junction on image 64/2. This hyperdensity at the right ureterovesical junction is similar to exam in 2021. Evidence for small bladder diverticula. Small amount of fluid in the urinary bladder. Stomach/Bowel: Stomach is within normal limits. Appendix appears normal. No evidence of bowel wall thickening, distention, or inflammatory changes. Mild circumferential wall thickening in the distal esophagus. Arterial/lymphatic: Normal caliber of  the abdominal aorta with minimal atherosclerotic disease. Common, internal and external iliac arteries are bilaterally without aneurysm or significant stenosis. Proximal femoral arteries are patent bilaterally. Main visceral arteries are patent. Incidentally, there are 2 right renal arteries. Reproductive: Prostate is enlarged with a prominent median lobe protruding along the base of the bladder. Other: Negative for ascites. Small umbilical hernia containing fat. Negative for free air. Musculoskeletal: Old vertebral body compression deformity at L3. Disc space narrowing at L5-S1. No acute bone abnormality. IVC: IVC is patent. There is an IVC filter positioned below the renal veins. There is no evidence for IVC thrombus. No evidence for thrombus within the IVC filter. Bilateral renal veins are patent. Portal and mesenteric veins: Portal and mesenteric veins are patent. Splenic vein is patent. Very large varices around the gastric cardia in left upper abdomen. Largest varix measures 1.2 cm on image 13/7. These varices are associated with the left gastric vein and likely draining via the gastrorenal shunt from the left renal vein. Bilateral iliac veins: Bilateral common, external and internal iliac veins patent. Right upper thigh: Limited evaluation due to  poor contrast opacification but no gross thrombus in the proximal femoral veins. Left upper thigh: Limited evaluation due to poor contrast opacification but no gross thrombus in the proximal femoral veins. IMPRESSION: 1. IVC is patent. IVC filter is positioned below the renal veins without complicating features. 2. 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 These varices are supplied from the left gastric vein and probably draining via a gastrorenal shunt. Main portal venous system is patent without thrombus. 3. Cirrhosis with evidence of portal hypertension. 4. 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 Subtle hypodensities in the liver are nonspecific on this examination. Patient had a previous MRI at [facility] on 03/15/2022 and recommend continued surveillance for hepatocellular carcinoma. 5. Enlarged prostate with bladder diverticula and chronic hyperdensity or enhancement at the right ureterovesical junction. Consider urologic consultation if this has not already been evaluated. 6. Multiple small pulmonary nodules. Many of these nodules appear to be stable. Some of the nodules are age indeterminate. No follow-up needed if patient is low-risk (and has no known or suspected primary neoplasm). Non-contrast chest CT can be considered in 12 months if patient is high-risk. This recommendation follows the consensus statement: Guidelines for Management of Incidental Pulmonary Nodules Detected on CT Images: From the Fleischner Society 2017; Radiology 2017; 284:228-243. Electronically Signed   By: Markus Daft M.D.   On: 08/06/2022 09:23    Labs:  CBC: No results for input(s): "WBC", "HGB", "HCT", "PLT" in the last 8760 hours.  COAGS: No results for input(s): "INR", "APTT" in the last 8760 hours.  BMP: Recent Labs    10/05/21 1105  CREATININE 0.90    LIVER FUNCTION TESTS: No results for input(s): "BILITOT", "AST", "ALT", "ALKPHOS", "PROT", "ALBUMIN" in the last 8760 hours.  TUMOR  MARKERS: No results for input(s): "AFPTM", "CEA", "CA199", "CHROMGRNA" in the last 8760 hours.  Assessment and Plan:  Devin Hale is a 72 yo male being seen today for image-guided IVC filter removal. The patient presents today in their usual state of health. He is looking forward to having the filter removed today. Case has been reviewed and approved by Dr Denna Haggard and is set to proceed on 08/30/22 with Dr Maryelizabeth Kaufmann.  Risks and benefits discussed with the patient including, but not limited to bleeding, infection, contrast induced renal failure, filter fracture or migration which can lead to emergency surgery or even death,  and strut penetration with damage or irritation to adjacent structures.  All of the patient's questions were answered, patient is agreeable to proceed. Consent signed and in chart.   Thank you for this interesting consult.  I greatly enjoyed meeting Devin Hale and look forward to participating in their care.  A copy of this report was sent to the requesting provider on this date.  Electronically Signed: Lura Em, PA-C 08/30/2022, 3:05 PM   I spent a total of  40 Minutes   in face to face in clinical consultation, greater than 50% of which was counseling/coordinating care for IVC filter retrieval.

## 2022-08-30 NOTE — Procedures (Signed)
Vascular and Interventional Radiology Procedure Note  Patient: Devin Hale DOB: [birth year] Medical Record Number: [id number] Note Date/Time: 08/30/22 3:00 PM   Performing Physician: Michaelle Birks, MD Assistant(s): None  Diagnosis:  Prior DVT. Previously unable to anticoagulate  Procedure:   CENTRAL VENOGRAM INFERIOR VENA CAVA FILTER REMOVAL   Anesthesia: Conscious Sedation Complications: None Estimated Blood Loss: Minimal Specimens: None  Findings:  - access via the RIGHT internal jugular vein. - successful intact removal of infrarenal IVC filter.  Final report to follow once all images are reviewed and compared with previous studies.  See detailed dictation with images in PACS. The patient tolerated the procedure well without incident or complication and was returned to Recovery in stable condition.    Michaelle Birks, MD [facility] Rad[facility]   Pager. [place]

## 2022-09-03 ENCOUNTER — Ambulatory Visit: Payer: 59

## 2022-09-03 DIAGNOSIS — I442 Atrioventricular block, complete: Secondary | ICD-10-CM | POA: Diagnosis not present

## 2022-09-03 LAB — CUP PACEART REMOTE DEVICE CHECK
Battery Remaining Longevity: 39 mo
Battery Remaining Percentage: 59 %
Battery Voltage: 2.98 V
Brady Statistic AP VP Percent: 29 %
Brady Statistic AP VS Percent: 1 %
Brady Statistic AS VP Percent: 71 %
Brady Statistic AS VS Percent: 1 %
Brady Statistic RA Percent Paced: 28 %
Brady Statistic RV Percent Paced: 99 %
Date Time Interrogation Session: 20240315020012
Implantable Lead Connection Status: 753985
Implantable Lead Connection Status: 753985
Implantable Lead Implant Date: 20210224
Implantable Lead Implant Date: 20210224
Implantable Lead Location: 753859
Implantable Lead Location: 753860
Implantable Lead Model: 7842
Implantable Lead Serial Number: 1032895
Implantable Pulse Generator Implant Date: 20210224
Lead Channel Impedance Value: 390 Ohm
Lead Channel Impedance Value: 510 Ohm
Lead Channel Pacing Threshold Amplitude: 1.25 V
Lead Channel Pacing Threshold Amplitude: 1.5 V
Lead Channel Pacing Threshold Pulse Width: 1 ms
Lead Channel Pacing Threshold Pulse Width: 1 ms
Lead Channel Sensing Intrinsic Amplitude: 3.5 mV
Lead Channel Sensing Intrinsic Amplitude: 5.6 mV
Lead Channel Setting Pacing Amplitude: 1.5 V
Lead Channel Setting Pacing Amplitude: 3.5 V
Lead Channel Setting Pacing Pulse Width: 1 ms
Lead Channel Setting Sensing Sensitivity: 2 mV
Pulse Gen Model: 2272
Pulse Gen Serial Number: 9197143

## 2022-09-15 ENCOUNTER — Encounter: Payer: Self-pay | Admitting: Internal Medicine

## 2022-09-21 DIAGNOSIS — M810 Age-related osteoporosis without current pathological fracture: Principal | ICD-10-CM

## 2022-09-27 ENCOUNTER — Ambulatory Visit: Admit: 2022-09-27 | Discharge: 2022-09-28 | Payer: MEDICARE

## 2022-09-27 ENCOUNTER — Encounter: Payer: Self-pay | Admitting: Internal Medicine

## 2022-09-28 ENCOUNTER — Encounter: Payer: Self-pay | Admitting: Internal Medicine

## 2022-09-28 ENCOUNTER — Ambulatory Visit: Payer: 59 | Attending: Internal Medicine | Admitting: Internal Medicine

## 2022-09-28 VITALS — BP 100/68 | HR 68 | Ht 70.0 in | Wt 177.0 lb

## 2022-09-28 DIAGNOSIS — I442 Atrioventricular block, complete: Secondary | ICD-10-CM

## 2022-09-28 DIAGNOSIS — I4891 Unspecified atrial fibrillation: Secondary | ICD-10-CM | POA: Diagnosis not present

## 2022-09-28 DIAGNOSIS — Z950 Presence of cardiac pacemaker: Secondary | ICD-10-CM | POA: Diagnosis not present

## 2022-09-28 DIAGNOSIS — I5032 Chronic diastolic (congestive) heart failure: Secondary | ICD-10-CM | POA: Diagnosis not present

## 2022-09-28 NOTE — Patient Instructions (Signed)
Medication Instructions:  Your physician recommends that you continue on your current medications as directed. Please refer to the Current Medication list given to you today.  *If you need a refill on your cardiac medications before your next appointment, please call your pharmacy*   Lab Work: None ordered.  If you have labs (blood work) drawn today and your tests are completely normal, you will receive your results only by: MyChart Message (if you have MyChart) OR A paper copy in the mail If you have any lab test that is abnormal or we need to change your treatment, we will call you to review the results.   Testing/Procedures: None ordered.    Follow-Up: At [facility], you and your health needs are our priority.  As part of our continuing mission to provide you with exceptional heart care, we have created designated Provider Care Teams.  These Care Teams include your primary Cardiologist (physician) and Advanced Practice Providers (APPs -  Physician Assistants and Nurse Practitioners) who all work together to provide you with the care you need, when you need it.  We recommend signing up for the patient portal called "MyChart".  Sign up information is provided on this After Visit Summary.  MyChart is used to connect with patients for Virtual Visits (Telemedicine).  Patients are able to view lab/test results, encounter notes, upcoming appointments, etc.  Non-urgent messages can be sent to your provider as well.   To learn more about what you can do with MyChart, go to [email or web address].    Your next appointment:   6 months with Dr Klein 

## 2022-09-28 NOTE — Progress Notes (Signed)
.sk     ELECTROPHYSIOLOGY OFFICE NOTE  Patient ID: Devin Hale, MRN: [id number], DOB/AGE: 70/09/1950 70 y.o. Admit date: (Not on file) Date of Consult: 09/28/2022  Primary Physician: Louis MatteEntzminger, Lakeshia A, MD Primary Cardiologist:  [facility]*     HPI Devin Hale is a 72 y.o. male seen in followup for pacemaker-His bundle Abbott follow-up--[facility] Implantation complicated by atrial lead dislodgment as well as pericarditis for which he received colchicine for a month. Postprocedural atrial fibrillation >>started on Eliquis  Evaluation also demonstrated unipolar pacing and pocket stimulation during backup pulsing.  Programmed to bipolar pacing  There has been an issue of possible sarcoid based on cMRI imaging.  Discussions with [facility] and [facility], sarcoid suggested* cMRI;  PET scan [facility] 10/22 "septal... Basal anterior and basal inferior with abnormal metabolism ... Nonspecific and could represent sarcoid or other inflammatory process "    also bilateral hilar and mediastinal lymph nodes"  [facility].  Notes include "whole body PET. No hypermetabolic hilar lymph nodes. 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  Rheum etiology consultation also 1/23 suggested "some other form of inflammatory myopathy "  Also has hepatitis C-treated complicated by cirrhosis and interval development of hepatocellular cancer treated by ablation. [facility]  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 On long term steroids (initiated 10/22)   [facility]  Peak VO2 14; peak HR 109 He saw Dr. Dorthea CoveB in consultation who noted his severe dyspnea did not think it was likely primarily cardiac.  There a question was raised with his RVR slope regarding mitochondrial myopathies although these have not been found on muscle biopsy.  He continues to have significant functional limitations with dyspnea.  No lightheadedness or chest pain.  He continues with bilateral lower extremity swelling.  This has been of longstanding, he [facility] was found to have a popliteal clot and a chronic femoral blood clot and underwent a filter which is also been removed as well as initiation of anticoagulation on apixaban.  [facility] and workup is pending      DATE TEST EF   2/21 [facility]    % Nonobstructive Cx/RCA  4/21 Echo  55-65% Small mod pericardial effusion  9/21 cMRI  LGE in noncardiac distribution suggestive of sarcoid  2/23 cMRI  62% LGE  RV inferior insertion Basal anterior wall  Unchanged from previously  6/23 Echo 55-65% Ow normal  Aortic atherosclerosis    Date Cr K Hgb LDL  4/21 0.89 4.5 14.5 114  10/21 0.99 4.0 14.4   6/23 1.0 4.5 15.3      Past Medical History:  Diagnosis Date   Complete heart block    Hepatitis    Hepatitis C    Pacemaker       Surgical History:  Past Surgical History:  Procedure Laterality Date   COLONOSCOPY WITH PROPOFOL N/A 05/27/2020   Procedure: COLONOSCOPY WITH PROPOFOL;  Surgeon: Midge MiniumWohl, Darren, MD;  [facility];  Service: Endoscopy;  Laterality: N/A;   ESOPHAGOGASTRODUODENOSCOPY (EGD) WITH PROPOFOL N/A 05/27/2020   Procedure: ESOPHAGOGASTRODUODENOSCOPY (EGD) WITH PROPOFOL;  Surgeon: Midge Minium, MD;  Location: [facility] ENDOSCOPY;  Service: Endoscopy;  Laterality: N/A;   IR IVC FILTER RETRIEVAL / S&I /IMG GUID/MOD SED  08/30/2022   IR RADIOLOGIST EVAL & MGMT  08/18/2022   KNEE  ARTHROSCOPY Left 1982   LIVER BIOPSY     NASAL SINUS SURGERY  2019   TEMPORARY PACEMAKER N/A 08/13/2019   Procedure: TEMPORARY PACEMAKER;  Surgeon: Iran Ouch, MD;  Location: [facility] INVASIVE CV LAB;  Service: Cardiovascular;  Laterality: N/A;   TONSILLECTOMY       Home Meds: Current Meds  Medication Sig   ascorbic acid (VITAMIN C) 500 MG tablet Take 500 mg by mouth 2 (two) times daily.    B Complex-Biotin-FA (VITAMIN B50 COMPLEX PO) Take by mouth daily.   cholecalciferol (VITAMIN D3) 25 MCG (1000 UNIT) tablet Take 1,000 Units by mouth in the morning and at bedtime.    Coenzyme Q10 (CO Q 10) 100 MG CAPS Take 100 mg by mouth daily.   denosumab (PROLIA) 60 MG/ML SOSY injection Inject 60 mg into the skin every 6 (six) months.   ELIQUIS 5 MG TABS tablet 5 mg 2 (two) times daily.   magnesium oxide (MAG-OX) 400 MG tablet Take 200 mg by mouth 2 (two) times daily.   metroNIDAZOLE (METROGEL) 1 % gel Apply 1 Application topically daily as needed.   Multiple Vitamin (MULTIVITAMIN WITH MINERALS) TABS tablet Take 1 tablet by mouth daily.   Omega-3 Fatty Acids (FISH OIL) 1000 MG CAPS Take 2 capsules by mouth daily.   PREDNISONE PO Take 7.5 mg by mouth daily.   vitamin E 200 UNIT capsule Take 200 Units by mouth daily.   Whey Protein POWD Take 15 g by mouth daily.    Allergies: No Known Allergies      ROS:  Please see the history of present illness.     All other systems reviewed and negative.  BP 100/68   Pulse 68   Ht 5\' 10"  (1.778 m)   Wt 177 lb (80.3 kg)   SpO2 98%   BMI 25.40 kg/m   Well developed and well nourished in no acute distress HENT normal Neck supple with JVP-flat Clear Device pocket well healed; without hematoma or erythema.  There is no tethering  Regular rate and rhythm, no  gallop No  murmur Abd-soft with active BS No Clubbing cyanosis 2+ edema Skin-warm and dry A & Oriented  Grossly normal sensory and motor function  ECG sinus with P synchronous pacing  (his/LBBA lead) QRSd 170 ms  Date QRSd  4/21 152  8/21 106  4/22 178     Device function is normal. Programming changes none  See Paceart for details    Labs: Cardiac Enzymes No results for input(s): "CKTOTAL", "CKMB", "TROPONINI" in the last 72 hours. CBC Lab Results  Component Value Date   WBC 7.0 04/07/2020   HGB 14.4 04/07/2020   HCT 41.7 04/07/2020   MCV 85.6 04/07/2020   PLT 104 (L) 04/07/2020   PR    Assessment and Plan:  Complete heart block-intermittent  Pacemaker-[employer] with a Environmental manager His lead  Pericarditis post atrial lead microperforation/dislodgment  Dyspnea  Atrial fibrillation postprocedure  Coronary artery disease non-obstructive  Hepatitis C  Neuromuscular issue currently undiagnosed  DVT on chronic anticoagulation per [facility] (IVC filter placed and then removed)   On anticoagulation for his DVT with  the plan to be 5 mg twice daily for 6 months and then possible down titration per [facility] Significant edema, however, in the context of venous insufficiency in the absence of neck veins I suspect it is that I will not augment anticoagulation  Reviewed the current status of neurological, hepatic and hematological visits           Sherryl Manges

## 2022-09-29 ENCOUNTER — Ambulatory Visit
Admit: 2022-09-29 | Discharge: 2022-09-30 | Payer: MEDICARE | Attending: Radiation Oncology | Primary: Radiation Oncology

## 2022-10-01 ENCOUNTER — Telehealth: Payer: Self-pay | Admitting: Gastroenterology

## 2022-10-01 NOTE — Telephone Encounter (Signed)
Pt left message in ref to EGD on 10/07/2022 has questions

## 2022-10-01 NOTE — Telephone Encounter (Signed)
Called pt lmovm, pt will likely need to r/s EGD as Eliquis was not on med list when originally scheduled

## 2022-10-01 NOTE — Telephone Encounter (Signed)
Pt is requesting a call back ASAP he wants it resolved today he has information from his hematologist at [facility] in ref to eliquis and approval

## 2022-10-04 ENCOUNTER — Encounter: Payer: Self-pay | Admitting: Internal Medicine

## 2022-10-04 ENCOUNTER — Telehealth: Payer: Self-pay

## 2022-10-04 ENCOUNTER — Telehealth: Payer: Self-pay | Admitting: Internal Medicine

## 2022-10-04 NOTE — Telephone Encounter (Signed)
Error

## 2022-10-04 NOTE — Telephone Encounter (Signed)
   Pre-operative Risk Assessment    Patient Name: Devin Hale  DOB: 1950-11-14 MRN: [id number]     Request for Surgical Clearance    Procedure:  EGD  Date of Surgery:  Clearance 10/07/22                                 Surgeon:  Dr. Midge Minium  Surgeon's Group or Practice Name:  [facility] Phone number:  [phone] Fax number:  [phone]   Type of Clearance Requested:   - Medical    Type of Anesthesia:  General    Additional requests/questions:    SignedVernard Gambles   10/04/2022, 7:52 AM

## 2022-10-04 NOTE — Telephone Encounter (Signed)
Spoke with pt who is requesting and update on his surgical clearance for EGD.  Pt advised he has been cleared by Dr Graciela Husbands and clearance has been sent to our pre-op team to be returned to requesting providers office.  Pt verbalized understanding and thanked Charity fundraiser for the callback.

## 2022-10-04 NOTE — Telephone Encounter (Signed)
Pt asked to speak to [facility] or [employer]. Did not wish to say what it is about.

## 2022-10-04 NOTE — Telephone Encounter (Signed)
Caller stated she is following-up on patient's clearance for surgery on 4/18.

## 2022-10-04 NOTE — Telephone Encounter (Signed)
Returned call to [place], she has been made aware that there is no update at this time, when it is completed, it will be faxed to them.  She was thankful for the return call.

## 2022-10-04 NOTE — Telephone Encounter (Signed)
Left message on voicemail.

## 2022-10-04 NOTE — Telephone Encounter (Signed)
I called Dr Graciela Husbands, cardiology office and [facility], received correspondence from Hematology regarding Eliquis, pt to hold Eliquis 2 days prior....   Still waiting on cardiac clearance

## 2022-10-04 NOTE — Telephone Encounter (Deleted)
Pt returned my call... I explained to the pt that when I was looking for his cardiac clearance from Dr Klein, I noticed that he was seen 4/4 by Hematology and was started on Eliquis for Hx of DVT... I explained to the pt that we will need to reschedule procedure as I will need clearance from both cardiology and hematology... Pt proceeded to tell me that I just need to call their office... I explained that we need it in writing as anesthesiologist will not proceed with the procedure due to his medical Hx and Eliquis   Pt began to yell at me, saying that I just needed to call the office... I explained once again that I need the actual clearance in writing, he screamed at me saying " I am tired of dealing with you, Devin Hale!" I then said that I believe he should talk to mgmnt at this point as I will not allow him to continue to yell and scream at me, I told him to have a nice day and mgmnt will be calling him  

## 2022-10-04 NOTE — Telephone Encounter (Signed)
Pt returned my call... I explained to the pt that when I was looking for his cardiac clearance from Dr Graciela Husbands, I noticed that he was seen 4/4 by Hematology and was started on Eliquis for Hx of DVT... I explained to the pt that we will need to reschedule procedure as I will need clearance from both cardiology and hematology... Pt proceeded to tell me that I just need to call their office... I explained that we need it in writing as anesthesiologist will not proceed with the procedure due to his medical Hx and Eliquis   Pt began to yell at me, saying that I just needed to call the office... I explained once again that I need the actual clearance in writing, he screamed at me saying " I am tired of dealing with you, Seretha Estabrooks!" I then said that I believe he should talk to mgmnt at this point as I will not allow him to continue to yell and scream at me, I told him to have a nice day and mgmnt will be calling him

## 2022-10-04 NOTE — Telephone Encounter (Signed)
Surgical risks with EGD are not excessive from CV point of view-- ok to proceed

## 2022-10-04 NOTE — Telephone Encounter (Signed)
Patient states he is returning call. Please advise.

## 2022-10-05 NOTE — Telephone Encounter (Signed)
Primary Cardiologist: Sherryl Manges, MD   Chart reviewed as part of pre-operative protocol coverage. Pre-op clearance already addressed by colleagues in earlier phone notes. To summarize recommendations:   - Surgical risks with EGD are not excessive from CV point of view-- ok to proceed   -Dr. Graciela Husbands   Will route this bundled recommendation to requesting provider via Epic fax function and remove from pre-op pool. Please call with questions.   Sharlene Dory, PA-C 10/05/2022, 7:38 AM

## 2022-10-05 NOTE — Progress Notes (Signed)
Remote pacemaker transmission.   

## 2022-10-05 NOTE — Telephone Encounter (Signed)
   Patient Name: Devin Hale  DOB: April 05, 1951 MRN: [id number]  Primary Cardiologist: Sherryl Manges, MD  Chart reviewed as part of pre-operative protocol coverage. Pre-op clearance already addressed by colleagues in earlier phone notes. To summarize recommendations:  - Surgical risks with EGD are not excessive from CV point of view-- ok to proceed   -Dr. Graciela Husbands  Will route this bundled recommendation to requesting provider via Epic fax function and remove from pre-op pool. Please call with questions.  Sharlene Dory, PA-C 10/05/2022, 7:38 AM

## 2022-10-06 ENCOUNTER — Institutional Professional Consult (permissible substitution): Admit: 2022-10-06 | Discharge: 2022-10-07 | Payer: MEDICARE

## 2022-10-06 ENCOUNTER — Ambulatory Visit: Admit: 2022-10-06 | Discharge: 2022-10-07 | Payer: MEDICARE | Attending: "Endocrinology | Primary: "Endocrinology

## 2022-10-06 ENCOUNTER — Ambulatory Visit: Admit: 2022-10-06 | Discharge: 2022-10-07 | Payer: MEDICARE

## 2022-10-06 ENCOUNTER — Encounter: Payer: Self-pay | Admitting: Gastroenterology

## 2022-10-06 DIAGNOSIS — S32030D Wedge compression fracture of third lumbar vertebra, subsequent encounter for fracture with routine healing: Principal | ICD-10-CM

## 2022-10-06 DIAGNOSIS — M810 Age-related osteoporosis without current pathological fracture: Principal | ICD-10-CM

## 2022-10-06 DIAGNOSIS — C220 Liver cell carcinoma: Principal | ICD-10-CM

## 2022-10-07 ENCOUNTER — Encounter: Payer: Self-pay | Admitting: Gastroenterology

## 2022-10-07 ENCOUNTER — Ambulatory Visit: Payer: 59 | Admitting: Anesthesiology

## 2022-10-07 ENCOUNTER — Ambulatory Visit
Admission: RE | Admit: 2022-10-07 | Discharge: 2022-10-07 | Disposition: A | Discharge status: Home / Self Care | Payer: 59 | Attending: Gastroenterology | Admitting: Gastroenterology

## 2022-10-07 ENCOUNTER — Other Ambulatory Visit: Payer: Self-pay

## 2022-10-07 ENCOUNTER — Encounter
Admission: RE | Disposition: A | Discharge status: Home / Self Care | Payer: Self-pay | Source: Home / Self Care | Attending: Gastroenterology

## 2022-10-07 DIAGNOSIS — I4891 Unspecified atrial fibrillation: Secondary | ICD-10-CM | POA: Insufficient documentation

## 2022-10-07 DIAGNOSIS — Z8619 Personal history of other infectious and parasitic diseases: Secondary | ICD-10-CM | POA: Insufficient documentation

## 2022-10-07 DIAGNOSIS — K7460 Unspecified cirrhosis of liver: Secondary | ICD-10-CM | POA: Diagnosis not present

## 2022-10-07 DIAGNOSIS — I442 Atrioventricular block, complete: Secondary | ICD-10-CM | POA: Diagnosis not present

## 2022-10-07 HISTORY — PX: ESOPHAGOGASTRODUODENOSCOPY (EGD) WITH PROPOFOL: SHX5813

## 2022-10-07 SURGERY — ESOPHAGOGASTRODUODENOSCOPY (EGD) WITH PROPOFOL
Anesthesia: General

## 2022-10-07 MED ORDER — PROPOFOL 10 MG/ML IV BOLUS
INTRAVENOUS | Status: DC | PRN
  Administered 2022-10-07: 70 mg via INTRAVENOUS
  Administered 2022-10-07: 30 mg via INTRAVENOUS

## 2022-10-07 MED ORDER — LIDOCAINE HCL (CARDIAC) PF 100 MG/5ML IV SOSY
PREFILLED_SYRINGE | INTRAVENOUS | Status: DC | PRN
  Administered 2022-10-07: 60 mg via INTRAVENOUS

## 2022-10-07 MED ORDER — HYDROCORTISONE SOD SUC (PF) 1000 MG IJ SOLR
INTRAMUSCULAR | Status: DC | PRN
  Administered 2022-10-07: 100 mg via INTRAVENOUS

## 2022-10-07 MED ORDER — SODIUM CHLORIDE 0.9 % IV SOLN: INTRAVENOUS | Status: DC

## 2022-10-07 NOTE — H&P (Signed)
Midge Minium, MD [facility] [address], [address] [place], [place] [address] [phone] Fax : [phone]  Primary Care Physician:  Louis Matte, MD Primary Gastroenterologist:  Dr. Servando Snare  Pre-Procedure History & Physical: HPI:  Devin Hale is a 72 y.o. male is here for an endoscopy.   Past Medical History:  Diagnosis Date   Complete heart block    Hepatitis    Hepatitis C    Pacemaker     Past Surgical History:  Procedure Laterality Date   COLONOSCOPY WITH PROPOFOL N/A 05/27/2020   Procedure: COLONOSCOPY WITH PROPOFOL;  Surgeon: Midge Minium, MD;  Location: [facility] ENDOSCOPY;  Service: Endoscopy;  Laterality: N/A;   ESOPHAGOGASTRODUODENOSCOPY (EGD) WITH PROPOFOL N/A 05/27/2020   Procedure: ESOPHAGOGASTRODUODENOSCOPY (EGD) WITH PROPOFOL;  Surgeon: Midge Minium, MD;  Location: [facility] ENDOSCOPY;  Service: Endoscopy;  Laterality: N/A;   IR IVC FILTER RETRIEVAL / S&I /IMG GUID/MOD SED  08/30/2022   IR RADIOLOGIST EVAL & MGMT  08/18/2022   KNEE ARTHROSCOPY Left 1982   LIVER BIOPSY     NASAL SINUS SURGERY  2019   TEMPORARY PACEMAKER N/A 08/13/2019   Procedure: TEMPORARY PACEMAKER;  Surgeon: Iran Ouch, MD;  Location: [facility] INVASIVE CV LAB;  Service: Cardiovascular;  Laterality: N/A;   TONSILLECTOMY      Prior to Admission medications   Medication Sig Start Date End Date Taking? Authorizing Provider  ascorbic acid (VITAMIN C) 500 MG tablet Take 500 mg by mouth 2 (two) times daily.    Yes [provider]  B Complex-Biotin-FA (VITAMIN B50 COMPLEX PO) Take by mouth daily.   Yes [provider]  cholecalciferol (VITAMIN D3) 25 MCG (1000 UNIT) tablet Take 1,000 Units by mouth in the morning and at bedtime.    Yes [provider]  Coenzyme Q10 (CO Q 10) 100 MG CAPS Take 100 mg by mouth daily.   Yes [provider]  magnesium oxide (MAG-OX) 400 MG tablet Take 200 mg by mouth 2 (two) times daily.   Yes [provider]  Multiple  Vitamin (MULTIVITAMIN WITH MINERALS) TABS tablet Take 1 tablet by mouth daily.   Yes [provider]  Omega-3 Fatty Acids (FISH OIL) 1000 MG CAPS Take 2 capsules by mouth daily.   Yes [provider]  PREDNISONE PO Take 7.5 mg by mouth daily. 03/26/21  Yes [provider]  vitamin E 200 UNIT capsule Take 200 Units by mouth daily.   Yes [provider]  Whey Protein POWD Take 15 g by mouth daily.   Yes [provider]  denosumab (PROLIA) 60 MG/ML SOSY injection Inject 60 mg into the skin every 6 (six) months.    [provider]  ELIQUIS 5 MG TABS tablet 5 mg 2 (two) times daily.    [provider]  metroNIDAZOLE (METROGEL) 1 % gel Apply 1 Application topically daily as needed.    [provider]    Allergies as of 08/19/2022   (No Known Allergies)    Family History  Problem Relation Age of Onset   Non-Hodgkin's lymphoma Mother    Heart attack Father     Social History   Socioeconomic History   Marital status: Single    Spouse name: Not on file   Number of children: Not on file   Years of education: Not on file   Highest education level: Not on file  Occupational History   Not on file  Tobacco Use   Smoking status: Never   Smokeless  tobacco: Never  Vaping Use   Vaping Use: Never used  Substance and Sexual Activity   Alcohol use: No   Drug use: Never   Sexual activity: Not on file  Other Topics Concern   Not on file  Social History Narrative   Not on file   Social Determinants of Health   Financial Resource Strain: Not on file  Food Insecurity: Not on file  Transportation Needs: Not on file  Physical Activity: Not on file  Stress: Not on file  Social Connections: Not on file  Intimate Partner Violence: Not on file    Review of Systems: See HPI, otherwise negative ROS  Physical Exam: BP (!) 144/83   Pulse 64   Temp (!) 96.5 F (35.8 C) (Temporal)   Resp 18   Ht  (1.778 m)   Wt  79.8 kg   SpO2 100%   BMI 25.25 kg/m  General:   Alert,  pleasant and cooperative in NAD Head:  Normocephalic and atraumatic. Neck:  Supple; no masses or thyromegaly. Lungs:  Clear throughout to auscultation.    Heart:  Regular rate and rhythm. Abdomen:  Soft, nontender and nondistended. Normal bowel sounds, without guarding, and without rebound.   Neurologic:  Alert and  oriented x4;  grossly normal neurologically.  Impression/Plan: Devin Hale is here for an endoscopy to be performed for cirrhosis  Risks, benefits, limitations, and alternatives regarding  endoscopy have been reviewed with the patient.  Questions have been answered.  All parties agreeable.   Midge Minium, MD  10/07/2022, 10:14 AM

## 2022-10-07 NOTE — Op Note (Signed)
[facility] Gastroenterology Patient Name: Devin Hale Procedure Date: 10/07/2022 10:18 AM MRN: [id number] Account #: [id number] Date of Birth: 1950-08-29 Admit Type: Outpatient Age: 72 Room: [facility] ENDO ROOM 1 Gender: Male Note Status: Finalized Instrument Name: Upper Endoscope [id number] Procedure:             Upper GI endoscopy Indications:           Cirrhosis rule out esophageal varices Providers:             Midge Minium MD, MD Referring MD:          No Local Md, MD (Referring MD) Medicines:             Propofol per Anesthesia Complications:         No immediate complications. Procedure:             Pre-Anesthesia Assessment:                        - Prior to the procedure, a History and Physical was                         performed, and patient medications and allergies were                         reviewed. The patient's tolerance of previous                         anesthesia was also reviewed. The risks and benefits                         of the procedure and the sedation options and risks                         were discussed with the patient. All questions were                         answered, and informed consent was obtained. Prior                         Anticoagulants: The patient has taken no anticoagulant                         or antiplatelet agents. ASA Grade Assessment: II - A                         patient with mild systemic disease. After reviewing                         the risks and benefits, the patient was deemed in                         satisfactory condition to undergo the procedure.                        After obtaining informed consent, the endoscope was                         passed under direct vision. Throughout the procedure,  the patient's blood pressure, pulse, and oxygen                         saturations were monitored continuously. The Endoscope                         was introduced through the  mouth, and advanced to the                         second part of duodenum. The upper GI endoscopy was                         accomplished without difficulty. The patient tolerated                         the procedure well. Findings:      The examined esophagus was normal.      The stomach was normal.      The examined duodenum was normal. Impression:            - Normal esophagus.                        - Normal stomach.                        - Normal examined duodenum.                        - No specimens collected. Recommendation:        - Discharge patient to home.                        - Resume previous diet.                        - Repeat upper endoscopy in 3 years for surveillance. Procedure Code(s):     --- Professional ---                        [id number], Esophagogastroduodenoscopy, flexible,                         transoral; diagnostic, including collection of                         specimen(s) by brushing or washing, when performed                         (separate procedure) Diagnosis Code(s):     --- Professional ---                        K74.60, Unspecified cirrhosis of liver CPT copyright 2022 American Medical Association. All rights reserved. The codes documented in this report are preliminary and upon coder review may  be revised to meet current compliance requirements. Midge Minium MD, MD 10/07/2022 10:31:52 AM This report has been signed electronically. Number of Addenda: 0 Note Initiated On: 10/07/2022 10:18 AM Estimated Blood Loss:  Estimated blood loss: none.      [facility]

## 2022-10-07 NOTE — Anesthesia Postprocedure Evaluation (Signed)
Anesthesia Post Note  Patient: Devin Hale  Procedure(s) Performed: ESOPHAGOGASTRODUODENOSCOPY (EGD) WITH PROPOFOL  Patient location during evaluation: Endoscopy Anesthesia Type: General Level of consciousness: awake and alert Pain management: pain level controlled Vital Signs Assessment: post-procedure vital signs reviewed and stable Respiratory status: spontaneous breathing, nonlabored ventilation, respiratory function stable and patient connected to nasal cannula oxygen Cardiovascular status: blood pressure returned to baseline and stable Postop Assessment: no apparent nausea or vomiting Anesthetic complications: no   No notable events documented.   Last Vitals:  Vitals:   10/07/22 1032 10/07/22 1042  BP: 103/65 105/68  Pulse: 61   Resp:    Temp: 36.4 C   SpO2: 96%     Last Pain:  Vitals:   10/07/22 1052  TempSrc:   PainSc: 0-No pain                 Louie Boston

## 2022-10-07 NOTE — Transfer of Care (Signed)
Immediate Anesthesia Transfer of Care Note  Patient: Devin Hale  Procedure(s) Performed: ESOPHAGOGASTRODUODENOSCOPY (EGD) WITH PROPOFOL  Patient Location: PACU  Anesthesia Type:General  Level of Consciousness: awake, alert , and oriented  Airway & Oxygen Therapy: Patient Spontanous Breathing  Post-op Assessment: Report given to RN and Post -op Vital signs reviewed and stable  Post vital signs: Reviewed and stable  Last Vitals:  Vitals Value Taken Time  BP 103/65 10/07/22 1032  Temp 36.4 C 10/07/22 1032  Pulse 60 10/07/22 1033  Resp 19 10/07/22 1033  SpO2 96 % 10/07/22 1033  Vitals shown include unvalidated device data.  Last Pain:  Vitals:   10/07/22 1032  TempSrc: Temporal  PainSc: Asleep         Complications: No notable events documented.

## 2022-10-07 NOTE — Anesthesia Preprocedure Evaluation (Addendum)
Anesthesia Evaluation  Patient identified by MRN, date of birth, ID band Patient awake    Reviewed: Allergy & Precautions, NPO status , Patient's Chart, lab work & pertinent test results  History of Anesthesia Complications Negative for: history of anesthetic complications  Airway Mallampati: III  TM Distance: >3 FB Neck ROM: full    Dental no notable dental hx.    Pulmonary neg pulmonary ROS   Pulmonary exam normal        Cardiovascular + dysrhythmias (complete heart block) Atrial Fibrillation + pacemaker      Neuro/Psych  PSYCHIATRIC DISORDERS Anxiety      Neuromuscular disease (daily steroids)    GI/Hepatic negative GI ROS,,,(+) Cirrhosis       , Hepatitis -, C  Endo/Other  negative endocrine ROS    Renal/GU negative Renal ROS  negative genitourinary   Musculoskeletal  (+) Arthritis ,    Abdominal   Peds  Hematology negative hematology ROS (+)   Anesthesia Other Findings Past Medical History: No date: Complete heart block No date: Hepatitis No date: Hepatitis C No date: Pacemaker  Past Surgical History: 05/27/2020: COLONOSCOPY WITH PROPOFOL; N/A     Comment:  Procedure: COLONOSCOPY WITH PROPOFOL;  Surgeon: Midge Minium, MD;  Location: [facility] ENDOSCOPY;  Service:               Endoscopy;  Laterality: N/A; 05/27/2020: ESOPHAGOGASTRODUODENOSCOPY (EGD) WITH PROPOFOL; N/A     Comment:  Procedure: ESOPHAGOGASTRODUODENOSCOPY (EGD) WITH               PROPOFOL;  Surgeon: Midge Minium, MD;  Location: [facility]               ENDOSCOPY;  Service: Endoscopy;  Laterality: N/A; 08/30/2022: IR IVC FILTER RETRIEVAL / S&I Lenise Arena GUID/MOD SED 08/18/2022: IR RADIOLOGIST EVAL & MGMT 1982: KNEE ARTHROSCOPY; Left No date: LIVER BIOPSY 2019: NASAL SINUS SURGERY 08/13/2019: TEMPORARY PACEMAKER; N/A     Comment:  Procedure: TEMPORARY PACEMAKER;  Surgeon: Iran Ouch, MD;  Location: [facility] INVASIVE CV LAB;                 Service: Cardiovascular;  Laterality: N/A; No date: TONSILLECTOMY     Reproductive/Obstetrics negative OB ROS                             Anesthesia Physical Anesthesia Plan  ASA: 3  Anesthesia Plan: General   Post-op Pain Management:    Induction: Intravenous  PONV Risk Score and Plan: Propofol infusion and TIVA  Airway Management Planned: Natural Airway and Nasal Cannula  Additional Equipment:   Intra-op Plan:   Post-operative Plan:   Informed Consent: I have reviewed the patients History and Physical, chart, labs and discussed the procedure including the risks, benefits and alternatives for the proposed anesthesia with the patient or authorized representative who has indicated his/her understanding and acceptance.     Dental Advisory Given  Plan Discussed with: Anesthesiologist, CRNA and Surgeon  Anesthesia Plan Comments: (Patient consented for risks of anesthesia including but not limited to:  - adverse reactions to medications - risk of airway placement if required - damage to eyes, teeth, lips or other oral mucosa - nerve damage due to positioning  - sore throat or hoarseness - Damage to heart, brain, nerves,  lungs, other parts of body or loss of life  Patient voiced understanding.)       Anesthesia Quick Evaluation

## 2022-10-08 ENCOUNTER — Encounter: Payer: Self-pay | Admitting: Gastroenterology

## 2022-10-18 ENCOUNTER — Telehealth (INDEPENDENT_AMBULATORY_CARE_PROVIDER_SITE_OTHER): Payer: Self-pay

## 2022-10-18 DIAGNOSIS — M810 Age-related osteoporosis without current pathological fracture: Principal | ICD-10-CM

## 2022-10-18 NOTE — Telephone Encounter (Signed)
Pt called in requesting an appt. I explained that since it has been 3 or more years since we have seen him, he would need a referral. Pt states that he has had a complete evaluation from [facility] within the last 90 days. I explained again, per practice policy, we need to have the referral in hand before we make the appt. He stated that he did not want a specialist to send another specialist a referral. I again explained to him that his PCP could send the referral in with office notes he has for the vascular DX as well as any recent imaging. Pt hung up on me so I called patient back and stated exactly what we needed and that he could have [facility] give notes/imaging to PCP and his PCP could send referral. I asked for a return call and stated that I would process the referral as soon as I could when I received it and would give him a call back. I will await a referral or call back from patient.

## 2022-10-22 ENCOUNTER — Encounter: Payer: Self-pay | Admitting: Endocrinology

## 2022-10-26 ENCOUNTER — Encounter: Payer: Self-pay | Admitting: Endocrinology

## 2022-10-28 ENCOUNTER — Encounter: Payer: Self-pay | Admitting: Endocrinology

## 2022-10-29 ENCOUNTER — Encounter (INDEPENDENT_AMBULATORY_CARE_PROVIDER_SITE_OTHER): Payer: Self-pay | Admitting: Vascular Surgery

## 2022-10-29 ENCOUNTER — Ambulatory Visit (INDEPENDENT_AMBULATORY_CARE_PROVIDER_SITE_OTHER): Payer: 59 | Admitting: Vascular Surgery

## 2022-10-29 VITALS — BP 114/77 | HR 69 | Resp 16 | Wt 178.4 lb

## 2022-10-29 DIAGNOSIS — I872 Venous insufficiency (chronic) (peripheral): Secondary | ICD-10-CM

## 2022-10-29 DIAGNOSIS — I83811 Varicose veins of right lower extremities with pain: Secondary | ICD-10-CM

## 2022-10-29 DIAGNOSIS — K7460 Unspecified cirrhosis of liver: Secondary | ICD-10-CM

## 2022-10-29 DIAGNOSIS — M7989 Other specified soft tissue disorders: Secondary | ICD-10-CM | POA: Diagnosis not present

## 2022-10-29 NOTE — Progress Notes (Signed)
Patient ID: Devin Hale, male   DOB: Mar 03, 1951, 72 y.o.   MRN: [id number]  No chief complaint on file.   HPI Devin Hale is a 72 y.o. male.  I am asked to see the patient by *** for evaluation of ***.     Past Medical History:  Diagnosis Date   Complete heart block (HCC)    Hepatitis    Hepatitis C    Pacemaker     Past Surgical History:  Procedure Laterality Date   COLONOSCOPY WITH PROPOFOL N/A 05/27/2020   Procedure: COLONOSCOPY WITH PROPOFOL;  Surgeon: Midge Minium, MD;  Location: [facility] ENDOSCOPY;  Service: Endoscopy;  Laterality: N/A;   ESOPHAGOGASTRODUODENOSCOPY (EGD) WITH PROPOFOL N/A 05/27/2020   Procedure: ESOPHAGOGASTRODUODENOSCOPY (EGD) WITH PROPOFOL;  Surgeon: Midge Minium, MD;  Location: [facility] ENDOSCOPY;  Service: Endoscopy;  Laterality: N/A;   ESOPHAGOGASTRODUODENOSCOPY (EGD) WITH PROPOFOL N/A 10/07/2022   Procedure: ESOPHAGOGASTRODUODENOSCOPY (EGD) WITH PROPOFOL;  Surgeon: Midge Minium, MD;  Location: [facility] ENDOSCOPY;  Service: Endoscopy;  Laterality: N/A;  9:30 ARRIVAL TIME DUE TO TRANSPORTATION   IR IVC FILTER RETRIEVAL / S&I /IMG GUID/MOD SED  08/30/2022   IR RADIOLOGIST EVAL & MGMT  08/18/2022   KNEE ARTHROSCOPY Left 1982   LIVER BIOPSY     NASAL SINUS SURGERY  2019   TEMPORARY PACEMAKER N/A 08/13/2019   Procedure: TEMPORARY PACEMAKER;  Surgeon: Iran Ouch, MD;  Location: [facility] INVASIVE CV LAB;  Service: Cardiovascular;  Laterality: N/A;   TONSILLECTOMY       Family History  Problem Relation Age of Onset   Non-Hodgkin's lymphoma Mother    Heart attack Father   No bleeding or clotting disorders   Social History   Tobacco Use   Smoking status: Never   Smokeless tobacco: Never  Vaping Use   Vaping Use: Never used  Substance Use Topics   Alcohol use: No   Drug use: Never     No Known Allergies  Current Outpatient Medications  Medication Sig Dispense Refill   ascorbic acid (VITAMIN C) 500 MG tablet Take 500 mg by mouth 2 (two) times daily.       B Complex-Biotin-FA (VITAMIN B50 COMPLEX PO) Take by mouth daily.     cholecalciferol (VITAMIN D3) 25 MCG (1000 UNIT) tablet Take 1,000 Units by mouth in the morning and at bedtime.      Coenzyme Q10 (CO Q 10) 100 MG CAPS Take 100 mg by mouth daily.     denosumab (PROLIA) 60 MG/ML SOSY injection Inject 60 mg into the skin every 6 (six) months.     ELIQUIS 5 MG TABS tablet 5 mg 2 (two) times daily.     magnesium oxide (MAG-OX) 400 MG tablet Take 200 mg by mouth 2 (two) times daily.     metroNIDAZOLE (METROGEL) 1 % gel Apply 1 Application topically daily as needed.     Multiple Vitamin (MULTIVITAMIN WITH MINERALS) TABS tablet Take 1 tablet by mouth daily.     Omega-3 Fatty Acids (FISH OIL) 1000 MG CAPS Take 2 capsules by mouth daily.     PREDNISONE PO Take 7.5 mg by mouth daily.     vitamin E 200 UNIT capsule Take 200 Units by mouth daily.     Whey Protein POWD Take 15 g by mouth daily.     No current facility-administered medications for this visit.      REVIEW OF SYSTEMS (Negative unless checked)  Constitutional: [] Weight loss  [] Fever  [] Chills Cardiac: [] Chest pain   []   Chest pressure   [] Palpitations   [] Shortness of breath when laying flat   [] Shortness of breath at rest   [] Shortness of breath with exertion. Vascular:  [] Pain in legs with walking   [] Pain in legs at rest   [] Pain in legs when laying flat   [] Claudication   [] Pain in feet when walking  [] Pain in feet at rest  [] Pain in feet when laying flat   [] History of DVT   [] Phlebitis   [x] Swelling in legs   [x] Varicose veins   [] Non-healing ulcers Pulmonary:   [] Uses home oxygen   [] Productive cough   [] Hemoptysis   [] Wheeze  [] COPD   [] Asthma Neurologic:  [] Dizziness  [] Blackouts   [] Seizures   [] History of stroke   [] History of TIA  [] Aphasia   [] Temporary blindness   [] Dysphagia   [] Weakness or numbness in arms   [] Weakness or numbness in legs Musculoskeletal:  [] Arthritis   [] Joint swelling   [] Joint pain   [] Low back  pain Hematologic:  [] Easy bruising  [] Easy bleeding   [] Hypercoagulable state   [] Anemic  [] Hepatitis Gastrointestinal:  [] Blood in stool   [] Vomiting blood  [] Gastroesophageal reflux/heartburn   [] Abdominal pain Genitourinary:  [] Chronic kidney disease   [] Difficult urination  [] Frequent urination  [] Burning with urination   [] Hematuria Skin:  [] Rashes   [] Ulcers   [] Wounds Psychological:  [] History of anxiety   []  History of major depression.    Physical Exam There were no vitals taken for this visit. Gen:  WD/WN, NAD Head: [place]/AT, No temporalis wasting.  Ear/Nose/Throat: Hearing grossly intact, nares w/o erythema or drainage, oropharynx w/o Erythema/Exudate Eyes: Conjunctiva clear, sclera non-icteric  Neck: trachea midline.  No JVD.  Pulmonary:  Good air movement, respirations not labored, no use of accessory muscles  Cardiac: RRR, no JVD Vascular:  Vessel Right Left  Radial Palpable Palpable                                   Gastrointestinal:. No masses, surgical incisions, or scars. Musculoskeletal: M/S 5/5 throughout.  Extremities without ischemic changes.  No deformity or atrophy. *** edema. Neurologic: Sensation grossly intact in extremities.  Symmetrical.  Speech is fluent. Motor exam as listed above. Psychiatric: Judgment intact, Mood & affect appropriate for pt's clinical situation. Dermatologic: No rashes or ulcers noted.  No cellulitis or open wounds.    Radiology No results found.  Labs Recent Results (from the past 2160 hour(s))  CUP PACEART REMOTE DEVICE CHECK     Status: None   Collection Time: 09/03/22  2:00 AM  Result Value Ref Range   Date Time Interrogation Session [phone]    Pulse Generator Manufacturer [employer]    Pulse Gen Model 2272 Assurity MRI    Pulse Gen Serial Number [id number]    Clinic Name [facility]    Implantable Pulse Generator Type Implantable Pulse Generator    Implantable Pulse Generator Implant Date [id number]     Implantable Lead Manufacturer BOST    Implantable Lead Model 7842 Ingevity + MRI    Implantable Lead Serial Number [id number]    Implantable Lead Implant Date [id number]    Implantable Lead Location Detail 1 UNKNOWN    Implantable Lead Location [id number]    Implantable Lead Connection Status [id number]    Implantable Lead Manufacturer [facility]    Implantable Lead Model [id number] Tendril STS    Implantable Lead Implant Date [id number]    Implantable  Lead Location Detail 1 UNKNOWN    Implantable Lead Location [id number]    Implantable Lead Connection Status [phone]    Lead Channel Setting Sensing Sensitivity 2.0 mV   Lead Channel Setting Sensing Adaptation Mode Fixed Pacing    Lead Channel Setting Pacing Amplitude 3.5 V   Lead Channel Setting Pacing Pulse Width 1.0 ms   Lead Channel Setting Pacing Amplitude 1.5 V   Lead Channel Status NULL    Lead Channel Impedance Value 390 ohm   Lead Channel Sensing Intrinsic Amplitude 3.5 mV   Lead Channel Pacing Threshold Amplitude 1.5 V   Lead Channel Pacing Threshold Pulse Width 1.0 ms   Lead Channel Status NULL    Lead Channel Impedance Value 510 ohm   Lead Channel Sensing Intrinsic Amplitude 5.6 mV   Lead Channel Pacing Threshold Amplitude 1.25 V   Lead Channel Pacing Threshold Pulse Width 1.0 ms   Battery Status MOS    Battery Remaining Longevity 39 mo   Battery Remaining Percentage 59.0 %   Battery Voltage 2.98 V   Brady Statistic RA Percent Paced 28.0 %   Brady Statistic RV Percent Paced 99.0 %   Brady Statistic AP VP Percent 29.0 %   Brady Statistic AS VP Percent 71.0 %   Brady Statistic AP VS Percent 1.0 %   Brady Statistic AS VS Percent 1.0 %    Assessment/Plan:  No problem-specific Assessment & Plan notes found for this encounter.      Festus Barren 10/29/2022, 10:24 AM   This note was created with Dragon medical transcription system.  Any errors from dictation are unintentional.

## 2022-11-01 DIAGNOSIS — I83811 Varicose veins of right lower extremities with pain: Secondary | ICD-10-CM | POA: Insufficient documentation

## 2022-11-01 NOTE — Assessment & Plan Note (Signed)
Can certainly contribute to LE swelling.  

## 2022-11-01 NOTE — Assessment & Plan Note (Signed)
The patient has pain and swelling in the right lower extremity refractory to conservative measures.  He was considering saphenous vein ablation in [place] but would like to have this done closer to home.  I am happy to offer him right great saphenous vein laser ablation.  I discussed the risks and benefits of the procedure.  We had discussed this several years ago but this was delayed by the pandemic.  He has since had a DVT and we discussed that he is likely to have some pain and swelling in the right leg even after ablation but this should be improved.  He is interested in proceeding and we will begin the preapproval process at this time.

## 2022-11-01 NOTE — Assessment & Plan Note (Signed)
Largely from venous disease.

## 2022-11-01 NOTE — Assessment & Plan Note (Signed)
See plan as above 

## 2022-11-04 ENCOUNTER — Telehealth (INDEPENDENT_AMBULATORY_CARE_PROVIDER_SITE_OTHER): Payer: Self-pay | Admitting: Vascular Surgery

## 2022-11-04 NOTE — Telephone Encounter (Signed)
Patient called to let it be know that he was approved with [facility] so he wants to make [place] aware that it may be a kick back when trying to get him approved for laser ablation. Patient wants to be scheduled here at this office but was making our office aware and if any question needed please call patient directly     Please call and advise

## 2022-11-04 NOTE — Telephone Encounter (Signed)
Spoke with pt regarding his call this morning concerning laser ablation. I explained again to the patient that I am now scheduling people that were here in December for their laser ablations. I again explained the back order supplies starting in October 2023. Patient inquired as to what he should do to not have a duplicate PA for the laser. I explained that he should contact his insurance and let them know he is not using [facility] for the laser ablation but would be using [place]. I did explain that it would be at least a few months before I get to him in the PA and scheduling. Patient states that "he is at my mercy" and stated that he will see what happens. I assured him that I would call as soon as I get to his procedure. Pt acknowledged.

## 2022-11-05 ENCOUNTER — Telehealth: Payer: Self-pay

## 2022-11-05 NOTE — Telephone Encounter (Signed)
Returned pt's call from his VM left on vein line. He did not answer. I left him a VM to call us back if he still needs assistance.

## 2022-11-08 ENCOUNTER — Telehealth: Payer: Self-pay | Admitting: Internal Medicine

## 2022-11-08 NOTE — Telephone Encounter (Signed)
Pt would like a callback from nurse Heather. Please advise

## 2022-11-08 NOTE — Telephone Encounter (Signed)
I called and spoke with the patient. He advised her just wanted to call and say thank you for the care he has received from me as a primary nurse for Dr. Graciela Husbands as he heard I had moved into a new role.   Appreciation of the call expressed to the patient!

## 2022-11-08 NOTE — Telephone Encounter (Signed)
Returned the call to the patient. He stated that he wants to speak to [facility] and Herbert Seta only. Message routed.

## 2022-11-09 ENCOUNTER — Telehealth: Payer: Self-pay

## 2022-11-09 NOTE — Telephone Encounter (Signed)
Pt called wanting to be seen for possible laser ablation. He had been seen at [facility] recently, after being seen originally at [facility]. [place] is unable to schedule his procedure for possibly 4-5 months per their office manager. Pt has been scheduled to see MD here this week and is aware.

## 2022-11-10 ENCOUNTER — Other Ambulatory Visit: Payer: Self-pay

## 2022-11-11 ENCOUNTER — Ambulatory Visit: Payer: 59 | Admitting: Surgery

## 2022-11-12 ENCOUNTER — Other Ambulatory Visit: Payer: Self-pay | Admitting: Endocrinology

## 2022-11-12 ENCOUNTER — Telehealth (INDEPENDENT_AMBULATORY_CARE_PROVIDER_SITE_OTHER): Payer: Self-pay | Admitting: Vascular Surgery

## 2022-11-12 DIAGNOSIS — M810 Age-related osteoporosis without current pathological fracture: Secondary | ICD-10-CM

## 2022-11-12 NOTE — Telephone Encounter (Signed)
Pt LVM stating he had additional questions regarding the laser ablation procedure. I LVM for pt asking for a return call so I could help answer any questions.

## 2022-11-22 ENCOUNTER — Encounter: Payer: Self-pay | Admitting: Internal Medicine

## 2022-12-03 ENCOUNTER — Ambulatory Visit (INDEPENDENT_AMBULATORY_CARE_PROVIDER_SITE_OTHER): Payer: 59

## 2022-12-03 DIAGNOSIS — I442 Atrioventricular block, complete: Secondary | ICD-10-CM

## 2022-12-03 LAB — CUP PACEART REMOTE DEVICE CHECK
Battery Remaining Longevity: 38 mo
Battery Remaining Percentage: 55 %
Battery Voltage: 2.98 V
Brady Statistic AP VP Percent: 28 %
Brady Statistic AP VS Percent: 1 %
Brady Statistic AS VP Percent: 71 %
Brady Statistic AS VS Percent: 1 %
Brady Statistic RA Percent Paced: 28 %
Brady Statistic RV Percent Paced: 99 %
Date Time Interrogation Session: 20240614020013
Implantable Lead Connection Status: 753985
Implantable Lead Connection Status: 753985
Implantable Lead Implant Date: 20210224
Implantable Lead Implant Date: 20210224
Implantable Lead Location: 753859
Implantable Lead Location: 753860
Implantable Lead Model: 7842
Implantable Lead Serial Number: 1032895
Implantable Pulse Generator Implant Date: 20210224
Lead Channel Impedance Value: 400 Ohm
Lead Channel Impedance Value: 530 Ohm
Lead Channel Pacing Threshold Amplitude: 1 V
Lead Channel Pacing Threshold Amplitude: 1.25 V
Lead Channel Pacing Threshold Pulse Width: 1 ms
Lead Channel Pacing Threshold Pulse Width: 1 ms
Lead Channel Sensing Intrinsic Amplitude: 2.7 mV
Lead Channel Sensing Intrinsic Amplitude: 5.6 mV
Lead Channel Setting Pacing Amplitude: 1.25 V
Lead Channel Setting Pacing Amplitude: 3.5 V
Lead Channel Setting Pacing Pulse Width: 1 ms
Lead Channel Setting Sensing Sensitivity: 2 mV
Pulse Gen Model: 2272
Pulse Gen Serial Number: 9197143

## 2022-12-21 NOTE — Progress Notes (Signed)
Remote pacemaker transmission.   

## 2023-01-31 ENCOUNTER — Encounter: Payer: Self-pay | Admitting: Internal Medicine

## 2023-02-03 ENCOUNTER — Telehealth: Payer: Self-pay | Admitting: Internal Medicine

## 2023-02-03 NOTE — Telephone Encounter (Signed)
Spoke to patient and he wanted to know why he hadn't heard anything back in regard to his MyChart message that was sent on 01/31/23. Patient was unhappy with "lack of response" when it comes to his message

## 2023-02-03 NOTE — Telephone Encounter (Signed)
Patient wants a call back directly from RN Heather regarding MyChart message.

## 2023-02-04 ENCOUNTER — Encounter: Payer: Self-pay | Admitting: Internal Medicine

## 2023-02-04 NOTE — Telephone Encounter (Signed)
Reached out to pt directly

## 2023-02-04 NOTE — Telephone Encounter (Signed)
Attempted to contact the patient in regards to his MyChart message. No answer- I left a detailed message advising that it can take 2-3 days for messages to be responded to by providers. However, with this being Friday, I have reached out to Dr. Graciela Husbands directly to please provide a response to his message. MD acknowledged this request.  I advised her could call back if needed.

## 2023-02-04 NOTE — Telephone Encounter (Signed)
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

## 2023-03-04 ENCOUNTER — Ambulatory Visit (INDEPENDENT_AMBULATORY_CARE_PROVIDER_SITE_OTHER): Payer: 59

## 2023-03-04 DIAGNOSIS — I442 Atrioventricular block, complete: Secondary | ICD-10-CM

## 2023-03-04 LAB — CUP PACEART REMOTE DEVICE CHECK
Battery Remaining Longevity: 34 mo
Battery Remaining Percentage: 51 %
Battery Voltage: 2.98 V
Brady Statistic AP VP Percent: 29 %
Brady Statistic AP VS Percent: 1 %
Brady Statistic AS VP Percent: 70 %
Brady Statistic AS VS Percent: 1 %
Brady Statistic RA Percent Paced: 28 %
Brady Statistic RV Percent Paced: 99 %
Date Time Interrogation Session: 20240913020022
Implantable Lead Connection Status: 753985
Implantable Lead Connection Status: 753985
Implantable Lead Implant Date: 20210224
Implantable Lead Implant Date: 20210224
Implantable Lead Location: 753859
Implantable Lead Location: 753860
Implantable Lead Model: 7842
Implantable Lead Serial Number: 1032895
Implantable Pulse Generator Implant Date: 20210224
Lead Channel Impedance Value: 390 Ohm
Lead Channel Impedance Value: 530 Ohm
Lead Channel Pacing Threshold Amplitude: 1.25 V
Lead Channel Pacing Threshold Amplitude: 1.25 V
Lead Channel Pacing Threshold Pulse Width: 1 ms
Lead Channel Pacing Threshold Pulse Width: 1 ms
Lead Channel Sensing Intrinsic Amplitude: 3 mV
Lead Channel Sensing Intrinsic Amplitude: 6.5 mV
Lead Channel Setting Pacing Amplitude: 1.5 V
Lead Channel Setting Pacing Amplitude: 3.5 V
Lead Channel Setting Pacing Pulse Width: 1 ms
Lead Channel Setting Sensing Sensitivity: 2 mV
Pulse Gen Model: 2272
Pulse Gen Serial Number: 9197143

## 2023-03-10 NOTE — Progress Notes (Signed)
Remote pacemaker transmission.   

## 2023-03-20 DIAGNOSIS — M810 Age-related osteoporosis without current pathological fracture: Principal | ICD-10-CM

## 2023-03-21 ENCOUNTER — Ambulatory Visit
Admission: RE | Admit: 2023-03-21 | Discharge: 2023-03-21 | Disposition: A | Discharge status: Home / Self Care | Payer: 59 | Source: Ambulatory Visit | Attending: Endocrinology | Admitting: Endocrinology

## 2023-03-21 DIAGNOSIS — M810 Age-related osteoporosis without current pathological fracture: Secondary | ICD-10-CM | POA: Insufficient documentation

## 2023-03-29 ENCOUNTER — Encounter: Payer: Self-pay | Admitting: Internal Medicine

## 2023-03-29 ENCOUNTER — Ambulatory Visit: Payer: 59 | Attending: Internal Medicine | Admitting: Internal Medicine

## 2023-03-29 VITALS — BP 112/70 | HR 70 | Ht 69.0 in | Wt 173.0 lb

## 2023-03-29 DIAGNOSIS — I5032 Chronic diastolic (congestive) heart failure: Secondary | ICD-10-CM | POA: Diagnosis not present

## 2023-03-29 DIAGNOSIS — Z950 Presence of cardiac pacemaker: Secondary | ICD-10-CM

## 2023-03-29 DIAGNOSIS — I442 Atrioventricular block, complete: Secondary | ICD-10-CM | POA: Diagnosis not present

## 2023-03-29 DIAGNOSIS — I4891 Unspecified atrial fibrillation: Secondary | ICD-10-CM

## 2023-03-29 LAB — CUP PACEART INCLINIC DEVICE CHECK
Battery Remaining Longevity: 50 mo
Battery Voltage: 2.98 V
Brady Statistic RA Percent Paced: 27 %
Brady Statistic RV Percent Paced: 99 %
Date Time Interrogation Session: 20241008165250
Implantable Lead Connection Status: 753985
Implantable Lead Connection Status: 753985
Implantable Lead Implant Date: 20210224
Implantable Lead Implant Date: 20210224
Implantable Lead Location: 753859
Implantable Lead Location: 753860
Implantable Lead Model: 7842
Implantable Lead Serial Number: 1032895
Implantable Pulse Generator Implant Date: 20210224
Lead Channel Impedance Value: 400 Ohm
Lead Channel Impedance Value: 537.5 Ohm
Lead Channel Pacing Threshold Amplitude: 1 V
Lead Channel Pacing Threshold Amplitude: 1 V
Lead Channel Pacing Threshold Amplitude: 1.25 V
Lead Channel Pacing Threshold Amplitude: 1.25 V
Lead Channel Pacing Threshold Pulse Width: 1 ms
Lead Channel Pacing Threshold Pulse Width: 1 ms
Lead Channel Pacing Threshold Pulse Width: 1 ms
Lead Channel Pacing Threshold Pulse Width: 1 ms
Lead Channel Sensing Intrinsic Amplitude: 2.9 mV
Lead Channel Sensing Intrinsic Amplitude: 6.9 mV
Lead Channel Setting Pacing Amplitude: 0.875
Lead Channel Setting Pacing Amplitude: 2.5 V
Lead Channel Setting Pacing Pulse Width: 1 ms
Lead Channel Setting Sensing Sensitivity: 2 mV
Pulse Gen Model: 2272
Pulse Gen Serial Number: 9197143

## 2023-03-29 NOTE — Progress Notes (Signed)
.sk     ELECTROPHYSIOLOGY OFFICE NOTE  Patient ID: Devin Hale, MRN: [id number], DOB/AGE: 1951/02/01 72 y.o. Admit date: (Not on file) Date of Consult: 03/29/2023  Primary Physician: Louis Matte, MD Primary Cardiologist:  [facility]*     HPI Devin Hale is a 72 y.o. male seen in followup for pacemaker-His bundle Abbott follow-up--device implanted at [facility] 2/21 Implantation complicated by atrial lead dislodgment as well as pericarditis for which he received colchicine for a month. Postprocedural atrial fibrillation >>started on Eliquis  Evaluation also demonstrated unipolar pacing and pocket stimulation during backup pulsing.  Programmed to bipolar pacing  There has been an issue of possible sarcoid based on cMRI imaging.  Discussions with [facility] and [facility], sarcoid suggested* cMRI;  PET scan [facility] 10/22 "septal... Basal anterior and basal inferior with abnormal metabolism ... Nonspecific and could represent sarcoid or other inflammatory process "    also bilateral hilar and mediastinal lymph nodes"  Seen in consultation by Dr. Jeralyn Ruths at [facility] 1/23.  Notes include "whole body PET. No hypermetabolic hilar lymph nodes. 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  Rheum etiology consultation also 1/23 suggested "some other form of inflammatory myopathy "  Also has hepatitis C-treated complicated by cirrhosis and interval development of hepatocellular cancer treated by ablation. Followed at [facility].  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 On long term steroids (initiated 10/22)   CPX at [facility] for DOE  Peak VO2 14; peak HR 109 He saw Dr. Dorthea Cove in consultation who noted his severe dyspnea did not think it was likely primarily cardiac.  There a question was raised with his RVR slope regarding mitochondrial myopathies although these have not been found on muscle biopsy.  He continues to have significant functional limitations with dyspnea.  No lightheadedness or chest pain.  He continues with bilateral lower extremity swelling.  This has been of longstanding, he underwent venous testing at [facility] 12/23 was found to have a popliteal clot and a chronic femoral blood clot and underwent a filter which is also been removed as well as initiation of anticoagulation on apixaban.  Evaluated by [facility] hematology and workup is pending  Interval evaluation at the [facility].  Notes were reviewed.  Testing is pending.   Continues with significant lower extremity right greater than left edema which is improved markedly with ablation of his greater saphenous vein.  No orthopnea or nocturnal dyspnea.  DATE TEST EF   2/21 [facility]    % Nonobstructive Cx/RCA  4/21 Echo  55-65% Small mod pericardial effusion  9/21 cMRI  LGE in noncardiac distribution suggestive of sarcoid  2/23 cMRI  62% LGE  RV inferior insertion Basal anterior wall  Unchanged from previously  6/23 Echo 55-65% Ow normal  Aortic atherosclerosis    Date Cr K Hgb LDL  4/21 0.89 4.5 14.5 114  10/21 0.99 4.0 14.4   6/23 1.0 4.5 15.3      Past Medical History:  Diagnosis Date   Complete heart block (HCC)    Hepatitis    Hepatitis C    Pacemaker  Surgical History:  Past Surgical History:  Procedure Laterality Date   COLONOSCOPY WITH PROPOFOL N/A 05/27/2020   Procedure: COLONOSCOPY WITH PROPOFOL;  Surgeon: Midge Minium, MD;  Location: [facility] ENDOSCOPY;  Service: Endoscopy;  Laterality: N/A;   ESOPHAGOGASTRODUODENOSCOPY (EGD) WITH PROPOFOL N/A  05/27/2020   Procedure: ESOPHAGOGASTRODUODENOSCOPY (EGD) WITH PROPOFOL;  Surgeon: Midge Minium, MD;  Location: [facility] ENDOSCOPY;  Service: Endoscopy;  Laterality: N/A;   ESOPHAGOGASTRODUODENOSCOPY (EGD) WITH PROPOFOL N/A 10/07/2022   Procedure: ESOPHAGOGASTRODUODENOSCOPY (EGD) WITH PROPOFOL;  Surgeon: Midge Minium, MD;  Location: [facility] ENDOSCOPY;  Service: Endoscopy;  Laterality: N/A;  9:30 ARRIVAL TIME DUE TO TRANSPORTATION   IR IVC FILTER RETRIEVAL / S&I /IMG GUID/MOD SED  08/30/2022   IR RADIOLOGIST EVAL & MGMT  08/18/2022   KNEE ARTHROSCOPY Left 1982   LIVER BIOPSY     NASAL SINUS SURGERY  2019   TEMPORARY PACEMAKER N/A 08/13/2019   Procedure: TEMPORARY PACEMAKER;  Surgeon: Iran Ouch, MD;  Location: [facility] INVASIVE CV LAB;  Service: Cardiovascular;  Laterality: N/A;   TONSILLECTOMY       Home Meds: Current Meds  Medication Sig   apixaban (ELIQUIS) 2.5 MG TABS tablet Take by mouth 2 (two) times daily.   ascorbic acid (VITAMIN C) 500 MG tablet Take 500 mg by mouth 2 (two) times daily.    B Complex-Biotin-FA (VITAMIN B50 COMPLEX PO) Take by mouth daily.   cholecalciferol (VITAMIN D3) 25 MCG (1000 UNIT) tablet Take 1,000 Units by mouth in the morning and at bedtime.    Coenzyme Q10 (CO Q 10) 100 MG CAPS Take 100 mg by mouth daily.   denosumab (PROLIA) 60 MG/ML SOSY injection Inject 60 mg into the skin every 6 (six) months.   magnesium oxide (MAG-OX) 400 MG tablet Take 200 mg by mouth 2 (two) times daily.   metroNIDAZOLE (METROGEL) 1 % gel Apply 1 Application topically daily as needed.   Multiple Vitamin (MULTIVITAMIN WITH MINERALS) TABS tablet Take 1 tablet by mouth daily.   Omega-3 Fatty Acids (FISH OIL) 1000 MG CAPS Take 2 capsules by mouth daily.   PREDNISONE PO Take 7.5 mg by mouth daily.   vitamin E 200 UNIT capsule Take 200 Units by mouth daily.   Whey Protein POWD Take 15 g by mouth daily.    Allergies: No Known Allergies      ROS:  Please see the history of present  illness.     All other systems reviewed and negative.  BP 112/70   Pulse 70   Ht 5\' 9"  (1.753 m)   Wt 173 lb (78.5 kg)   SpO2 100%   BMI 25.55 kg/m   Well developed and well nourished in no acute distress HENT normal Neck supple with JVP-flat Clear Device pocket well healed; without hematoma or erythema.  There is no tethering  Regular rate and rhythm, no  murmur Abd-soft with active BS No Clubbing cyanosis 3+ R and 1 + L edema Skin-warm and dry A & Oriented  Grossly normal sensory and motor function  ECG sinus @ 70 20/14/42 P-synchronous/ AV  pacing   Device function is normal. Programming changes none  See Paceart for details    Date QRSd  4/21 152  8/21 106  4/22 178  10/24 144      Labs: Cardiac Enzymes No results for input(s): "CKTOTAL", "CKMB", "TROPONINI" in the last 72 hours. CBC Lab Results  Component Value Date   WBC 7.0 04/07/2020   HGB 14.4 04/07/2020   HCT 41.7  04/07/2020   MCV 85.6 04/07/2020   PLT 104 (L) 04/07/2020   PR    Assessment and Plan:  Complete heart block-intermittent  Pacemaker-[employer] with a [employer] His lead  Pericarditis post atrial lead microperforation/dislodgment  Dyspnea  Atrial fibrillation postprocedure  Coronary artery disease non-obstructive  Hepatitis C  Neuromuscular issue currently undiagnosed  DVT on chronic anticoagulation per [facility] (IVC filter placed and then removed)  Cardiovascular function is stable.  Again reviewed with him the physiology of peripheral edema in the absence of jugular venous distention.  Anticoagulation for DVT       Sherryl Manges

## 2023-03-29 NOTE — Patient Instructions (Signed)
Medication Instructions:  The current medical regimen is effective;  continue present plan and medications.  *If you need a refill on your cardiac medications before your next appointment, please call your pharmacy*   Follow-Up: At [facility], you and your health needs are our priority.  As part of our continuing mission to provide you with exceptional heart care, we have created designated Provider Care Teams.  These Care Teams include your primary Cardiologist (physician) and Advanced Practice Providers (APPs -  Physician Assistants and Nurse Practitioners) who all work together to provide you with the care you need, when you need it.  We recommend signing up for the patient portal called "MyChart".  Sign up information is provided on this After Visit Summary.  MyChart is used to connect with patients for Virtual Visits (Telemedicine).  Patients are able to view lab/test results, encounter notes, upcoming appointments, etc.  Non-urgent messages can be sent to your provider as well.   To learn more about what you can do with MyChart, go to [email or web address].    Your next appointment:   June (8 months)   Provider:   Sherryl Manges, MD

## 2023-03-30 MED ORDER — FLUOCINOLONE ACETONIDE OIL 0.01 % EAR DROPS
OTIC | 0 refills | 45 days | Status: CP | PRN
Start: 2023-03-30 — End: ?

## 2023-04-06 ENCOUNTER — Ambulatory Visit: Admit: 2023-04-06 | Discharge: 2023-04-06 | Payer: MEDICARE | Attending: "Endocrinology | Primary: "Endocrinology

## 2023-04-06 ENCOUNTER — Institutional Professional Consult (permissible substitution): Admit: 2023-04-06 | Discharge: 2023-04-06 | Payer: MEDICARE

## 2023-04-06 DIAGNOSIS — I2510 Atherosclerotic heart disease of native coronary artery without angina pectoris: Principal | ICD-10-CM

## 2023-04-06 DIAGNOSIS — S32030D Wedge compression fracture of third lumbar vertebra, subsequent encounter for fracture with routine healing: Principal | ICD-10-CM

## 2023-04-06 DIAGNOSIS — G959 Disease of spinal cord, unspecified: Principal | ICD-10-CM

## 2023-04-06 DIAGNOSIS — C220 Liver cell carcinoma: Principal | ICD-10-CM

## 2023-04-06 DIAGNOSIS — B182 Chronic viral hepatitis C: Principal | ICD-10-CM

## 2023-04-06 DIAGNOSIS — K7460 Unspecified cirrhosis of liver: Principal | ICD-10-CM

## 2023-04-06 DIAGNOSIS — M810 Age-related osteoporosis without current pathological fracture: Principal | ICD-10-CM

## 2023-04-18 ENCOUNTER — Ambulatory Visit
Admit: 2023-04-18 | Discharge: 2023-04-19 | Payer: MEDICARE | Attending: Radiation Oncology | Primary: Radiation Oncology

## 2023-04-18 ENCOUNTER — Ambulatory Visit: Admit: 2023-04-18 | Discharge: 2023-04-19 | Payer: MEDICARE

## 2023-04-18 DIAGNOSIS — Z800 Family history of malignant neoplasm of digestive organs: Principal | ICD-10-CM

## 2023-04-19 ENCOUNTER — Ambulatory Visit: Admit: 2023-04-19 | Discharge: 2023-04-19 | Payer: MEDICARE

## 2023-04-19 ENCOUNTER — Institutional Professional Consult (permissible substitution): Admit: 2023-04-19 | Discharge: 2023-04-19 | Payer: MEDICARE | Attending: Audiologist | Primary: Audiologist

## 2023-04-19 DIAGNOSIS — H903 Sensorineural hearing loss, bilateral: Principal | ICD-10-CM

## 2023-04-19 DIAGNOSIS — J310 Chronic rhinitis: Principal | ICD-10-CM

## 2023-04-19 MED ORDER — IPRATROPIUM BROMIDE 21 MCG (0.03 %) NASAL SPRAY
2 refills | 0 days | Status: CP
Start: 2023-04-19 — End: ?

## 2023-04-26 ENCOUNTER — Ambulatory Visit: Admit: 2023-04-26 | Discharge: 2023-04-27 | Payer: MEDICARE

## 2023-05-23 ENCOUNTER — Encounter (INDEPENDENT_AMBULATORY_CARE_PROVIDER_SITE_OTHER): Payer: Self-pay | Admitting: Vascular Surgery

## 2023-05-23 ENCOUNTER — Encounter: Payer: Self-pay | Admitting: Internal Medicine

## 2023-06-01 ENCOUNTER — Telehealth (INDEPENDENT_AMBULATORY_CARE_PROVIDER_SITE_OTHER): Payer: Self-pay | Admitting: Vascular Surgery

## 2023-06-01 NOTE — Telephone Encounter (Signed)
Patient called to see where we are standing in regards to his laser ablation that he was supposed to receive. I returned patient's call and left a message explaining that we do have the supplies in now for the laser ablation procedures but currently I do not have any scheduling room for the rest of the year. I advised that I will be working on prior authorizations and will call him as soon as I hear back regarding a PA. I advised to call with any questions.

## 2023-06-02 ENCOUNTER — Telehealth (INDEPENDENT_AMBULATORY_CARE_PROVIDER_SITE_OTHER): Payer: Self-pay | Admitting: Vascular Surgery

## 2023-06-02 NOTE — Telephone Encounter (Signed)
Patient LVM for me to call him back regarding the laser ablation procedure that he is wanting. LVM for pt asking for a return call to help answer any questions he has.

## 2023-06-03 ENCOUNTER — Ambulatory Visit: Payer: 59

## 2023-06-03 DIAGNOSIS — I442 Atrioventricular block, complete: Secondary | ICD-10-CM | POA: Diagnosis not present

## 2023-06-04 LAB — CUP PACEART REMOTE DEVICE CHECK
Battery Remaining Longevity: 44 mo
Battery Remaining Percentage: 47 %
Battery Voltage: 2.98 V
Brady Statistic AP VP Percent: 28 %
Brady Statistic AP VS Percent: 1 %
Brady Statistic AS VP Percent: 70 %
Brady Statistic AS VS Percent: 1 %
Brady Statistic RA Percent Paced: 27 %
Brady Statistic RV Percent Paced: 98 %
Date Time Interrogation Session: 20241213020014
Implantable Lead Connection Status: 753985
Implantable Lead Connection Status: 753985
Implantable Lead Implant Date: 20210224
Implantable Lead Implant Date: 20210224
Implantable Lead Location: 753859
Implantable Lead Location: 753860
Implantable Lead Model: 7842
Implantable Lead Serial Number: 1032895
Implantable Pulse Generator Implant Date: 20210224
Lead Channel Impedance Value: 380 Ohm
Lead Channel Impedance Value: 510 Ohm
Lead Channel Pacing Threshold Amplitude: 1 V
Lead Channel Pacing Threshold Amplitude: 1.25 V
Lead Channel Pacing Threshold Pulse Width: 1 ms
Lead Channel Pacing Threshold Pulse Width: 1 ms
Lead Channel Sensing Intrinsic Amplitude: 10 mV
Lead Channel Sensing Intrinsic Amplitude: 3.8 mV
Lead Channel Setting Pacing Amplitude: 1.5 V
Lead Channel Setting Pacing Amplitude: 2.5 V
Lead Channel Setting Pacing Pulse Width: 1 ms
Lead Channel Setting Sensing Sensitivity: 2 mV
Pulse Gen Model: 2272
Pulse Gen Serial Number: 9197143

## 2023-06-08 ENCOUNTER — Telehealth (INDEPENDENT_AMBULATORY_CARE_PROVIDER_SITE_OTHER): Payer: Self-pay

## 2023-06-08 NOTE — Telephone Encounter (Signed)
So, I'm confused.  If he had his laser done already, is he calling to see about getting the opposite leg done? Does he have questions about the DVT? What exactly does he need?

## 2023-06-08 NOTE — Telephone Encounter (Signed)
Patient called with questions about upcoming laser in Feb. Requested a called back but didn't get an answer. Left voicemail to return call.

## 2023-06-08 NOTE — Telephone Encounter (Addendum)
Called patient and spoke with him. Pt called 06/01/23 and was told that his PA would be processed and someone would reach out to him for scheduling. Patient also sent a Mychart message about the wait from 10/29/22 and then called 06/08/23 about his appointment. Patient was told by Kenney Houseman that since it has been a while he has to start the process over from May, including [place] and follow up visits. Patient is upset because he feels his time is valuable. Patient stated he went and had his right leg ablation done at [facility], but according to his chart it was canceled.   Please advise. Would the patient have to start the process over?

## 2023-06-08 NOTE — Telephone Encounter (Signed)
The telephone notes don't currently detail the conversation that was held a few weeks ago.  I know that some insurances may require sooner follow up if the studies were done over 6 months ago and some may give leeway for about a year.  Unfortunately if it is an insurance requirement that is what has to happen before we can move forward with approval

## 2023-06-08 NOTE — Telephone Encounter (Signed)
Correction, patient stated he had his US done at done and they found a DVT. He the went to [facility] in [place] and - Dr.Featherston  and had his laser ablation and along with  3 Injections of  VERAFAME or DARITHANE? (Unsure how to spell it, but he spelled it the second way)

## 2023-06-09 NOTE — Telephone Encounter (Signed)
Patient stated that he was told here that he needed both legs done, but the right was more emergent than the left. So, he's looking to get the left one done now.

## 2023-06-23 ENCOUNTER — Encounter: Payer: Self-pay | Admitting: Internal Medicine

## 2023-06-24 ENCOUNTER — Telehealth (INDEPENDENT_AMBULATORY_CARE_PROVIDER_SITE_OTHER): Payer: Self-pay | Admitting: Vascular Surgery

## 2023-06-24 ENCOUNTER — Telehealth (INDEPENDENT_AMBULATORY_CARE_PROVIDER_SITE_OTHER): Payer: Self-pay

## 2023-06-24 NOTE — Telephone Encounter (Signed)
 LVM for pt Devin Hale and discuss laser ablation and any questions he has. Pt has appointment in February but it is NOT to have the laser procedure performed at that time. I need to make sure that patient is aware and try to help with any confusion that is going on.

## 2023-06-24 NOTE — Telephone Encounter (Signed)
 Patient called to see if any progress had been made for his Laser procedure. I left a voicemail with his appt with Dr Wyn Quaker on 08/02/23 at 1:30 pm.

## 2023-06-28 ENCOUNTER — Telehealth (INDEPENDENT_AMBULATORY_CARE_PROVIDER_SITE_OTHER): Payer: Self-pay | Admitting: Vascular Surgery

## 2023-06-28 ENCOUNTER — Encounter (INDEPENDENT_AMBULATORY_CARE_PROVIDER_SITE_OTHER): Payer: Self-pay | Admitting: Vascular Surgery

## 2023-06-28 NOTE — Telephone Encounter (Signed)
 Spoke with patient. He is really wanting to focus on having a laser done on the left leg. I again explained that I don't know what his particular insurance requires for prior authorization for time period of studies. Patient stated that he will check into getting imaging studies and will call us  back. I offered to try a prior auth with everything that we currently have but patient states he will see what he can gather and call us  back. I will wait for patient to call back.

## 2023-07-05 ENCOUNTER — Telehealth (INDEPENDENT_AMBULATORY_CARE_PROVIDER_SITE_OTHER): Payer: Self-pay | Admitting: Vascular Surgery

## 2023-07-05 NOTE — Telephone Encounter (Signed)
 Pt left message stating that he would like to talk to the practice manager about what is going on with the procedure. He states that he sent a mychart message and has not been replied to yet. I called patient back in order to get clarification. Patient has an appt on 2.11.25 with Dr. Marea. He has not been seen for his left leg. Patient has stated that he has had ultrasounds and exams at other locations but we do not have that information. I left a VM asking patient to call back.

## 2023-07-06 ENCOUNTER — Encounter (INDEPENDENT_AMBULATORY_CARE_PROVIDER_SITE_OTHER): Payer: Self-pay | Admitting: Vascular Surgery

## 2023-07-07 NOTE — Addendum Note (Signed)
Addended by: Elease Etienne A on: 07/07/2023 06:19 PM   Modules accepted: Orders

## 2023-07-07 NOTE — Progress Notes (Signed)
Remote pacemaker transmission.   

## 2023-07-18 NOTE — Telephone Encounter (Signed)
Devin Hale

## 2023-07-20 DIAGNOSIS — M810 Age-related osteoporosis without current pathological fracture: Principal | ICD-10-CM

## 2023-07-25 ENCOUNTER — Ambulatory Visit: Payer: 59 | Admitting: Podiatry

## 2023-07-28 ENCOUNTER — Encounter: Payer: Self-pay | Admitting: Internal Medicine

## 2023-08-02 ENCOUNTER — Ambulatory Visit (INDEPENDENT_AMBULATORY_CARE_PROVIDER_SITE_OTHER): Payer: 59 | Admitting: Vascular Surgery

## 2023-08-03 ENCOUNTER — Ambulatory Visit: Payer: 59 | Admitting: Podiatry

## 2023-08-16 ENCOUNTER — Ambulatory Visit (INDEPENDENT_AMBULATORY_CARE_PROVIDER_SITE_OTHER): Payer: 59 | Admitting: Podiatry

## 2023-08-16 DIAGNOSIS — B351 Tinea unguium: Secondary | ICD-10-CM

## 2023-08-16 NOTE — Progress Notes (Unsigned)
 Subjective:  Patient ID: Devin Hale, male    DOB: June 26, 1950,  MRN: [id number]  Chief Complaint  Patient presents with   Nail Problem    Pt stated that he has fungus on his nails     73 y.o. male presents with the above complaint.  Patient presents with right hallux nail fungus with thickened onychodystrophy mycotic nail x 1.  Patient would like to discuss treatment options for it.  He wants to discuss everything that is available.  He is currently currently doing topical with Lamisil cream.  He states it helps a little bit but wants to discuss if there is any other treatment options available   Review of Systems: Negative except as noted in the HPI. Denies N/V/F/Ch.  Past Medical History:  Diagnosis Date   Complete heart block (HCC)    Hepatitis    Hepatitis C    Pacemaker     Current Outpatient Medications:    apixaban (ELIQUIS) 2.5 MG TABS tablet, Take by mouth 2 (two) times daily., Disp: , Rfl:    ascorbic acid (VITAMIN C) 500 MG tablet, Take 500 mg by mouth 2 (two) times daily. , Disp: , Rfl:    B Complex-Biotin-FA (VITAMIN B50 COMPLEX PO), Take by mouth daily., Disp: , Rfl:    cholecalciferol (VITAMIN D3) 25 MCG (1000 UNIT) tablet, Take 1,000 Units by mouth in the morning and at bedtime. , Disp: , Rfl:    Coenzyme Q10 (CO Q 10) 100 MG CAPS, Take 100 mg by mouth daily., Disp: , Rfl:    denosumab (PROLIA) 60 MG/ML SOSY injection, Inject 60 mg into the skin every 6 (six) months., Disp: , Rfl:    magnesium oxide (MAG-OX) 400 MG tablet, Take 200 mg by mouth 2 (two) times daily., Disp: , Rfl:    metroNIDAZOLE (METROGEL) 1 % gel, Apply 1 Application topically daily as needed., Disp: , Rfl:    Multiple Vitamin (MULTIVITAMIN WITH MINERALS) TABS tablet, Take 1 tablet by mouth daily., Disp: , Rfl:    Omega-3 Fatty Acids (FISH OIL) 1000 MG CAPS, Take 2 capsules by mouth daily., Disp: , Rfl:    PREDNISONE PO, Take 7.5 mg by mouth daily., Disp: , Rfl:    vitamin E 200 UNIT capsule, Take  200 Units by mouth daily., Disp: , Rfl:    Whey Protein POWD, Take 15 g by mouth daily., Disp: , Rfl:   Social History   Tobacco Use  Smoking Status Never  Smokeless Tobacco Never    No Known Allergies Objective:  There were no vitals filed for this visit. There is no height or weight on file to calculate BMI. Constitutional Well developed. Well nourished.  Vascular Dorsalis pedis pulses palpable bilaterally. Posterior tibial pulses palpable bilaterally. Capillary refill normal to all digits.  No cyanosis or clubbing noted. Pedal hair growth normal.  Neurologic Normal speech. Oriented to person, place, and time. Epicritic sensation to light touch grossly present bilaterally.  Dermatologic Nails thickened and onychodystrophy mycotic nail x 1 Skin within normal limits  Orthopedic: Normal joint ROM without pain or crepitus bilaterally. No visible deformities. No bony tenderness.   Radiographs: None Assessment:   1. Nail fungus   2. Onychomycosis due to dermatophyte    Plan:  Patient was evaluated and treated and all questions answered.  Right hallux onychomycosis/nail fungus -Educated the patient on the etiology of onychomycosis and various treatment options associated with improving the fungal load.  I explained to the patient that there is 3  treatment options available to treat the onychomycosis including topical, p.o., laser treatment.  Patient elected to continue doing over-the-counter Lamisil.  He has enough cream.  At this time if he would like to discuss doing Lamisil we will think about that in the future he states understanding  No follow-ups on file.

## 2023-08-23 ENCOUNTER — Ambulatory Visit (INDEPENDENT_AMBULATORY_CARE_PROVIDER_SITE_OTHER): Payer: 59 | Admitting: Vascular Surgery

## 2023-08-23 ENCOUNTER — Encounter (INDEPENDENT_AMBULATORY_CARE_PROVIDER_SITE_OTHER): Payer: Self-pay

## 2023-09-02 ENCOUNTER — Ambulatory Visit (INDEPENDENT_AMBULATORY_CARE_PROVIDER_SITE_OTHER): Payer: 59

## 2023-09-02 DIAGNOSIS — I442 Atrioventricular block, complete: Secondary | ICD-10-CM

## 2023-09-02 LAB — CUP PACEART REMOTE DEVICE CHECK
Battery Remaining Longevity: 41 mo
Battery Remaining Percentage: 44 %
Battery Voltage: 2.98 V
Brady Statistic AP VP Percent: 28 %
Brady Statistic AP VS Percent: 1 %
Brady Statistic AS VP Percent: 70 %
Brady Statistic AS VS Percent: 1 %
Brady Statistic RA Percent Paced: 26 %
Brady Statistic RV Percent Paced: 98 %
Date Time Interrogation Session: 20250314020024
Implantable Lead Connection Status: 753985
Implantable Lead Connection Status: 753985
Implantable Lead Implant Date: 20210224
Implantable Lead Implant Date: 20210224
Implantable Lead Location: 753859
Implantable Lead Location: 753860
Implantable Lead Model: 7842
Implantable Lead Serial Number: 1032895
Implantable Pulse Generator Implant Date: 20210224
Lead Channel Impedance Value: 380 Ohm
Lead Channel Impedance Value: 510 Ohm
Lead Channel Pacing Threshold Amplitude: 1 V
Lead Channel Pacing Threshold Amplitude: 1.125 V
Lead Channel Pacing Threshold Pulse Width: 1 ms
Lead Channel Pacing Threshold Pulse Width: 1 ms
Lead Channel Sensing Intrinsic Amplitude: 2.9 mV
Lead Channel Sensing Intrinsic Amplitude: 5.4 mV
Lead Channel Setting Pacing Amplitude: 1.375
Lead Channel Setting Pacing Amplitude: 2.5 V
Lead Channel Setting Pacing Pulse Width: 1 ms
Lead Channel Setting Sensing Sensitivity: 2 mV
Pulse Gen Model: 2272
Pulse Gen Serial Number: 9197143

## 2023-09-12 ENCOUNTER — Encounter: Payer: Self-pay | Admitting: Internal Medicine

## 2023-09-13 ENCOUNTER — Ambulatory Visit (INDEPENDENT_AMBULATORY_CARE_PROVIDER_SITE_OTHER): Admitting: Vascular Surgery

## 2023-09-13 ENCOUNTER — Encounter (INDEPENDENT_AMBULATORY_CARE_PROVIDER_SITE_OTHER): Payer: Self-pay | Admitting: Vascular Surgery

## 2023-09-13 VITALS — BP 111/71 | HR 72 | Resp 18 | Ht 69.0 in | Wt 172.8 lb

## 2023-09-13 DIAGNOSIS — I890 Lymphedema, not elsewhere classified: Secondary | ICD-10-CM

## 2023-09-13 DIAGNOSIS — I83893 Varicose veins of bilateral lower extremities with other complications: Secondary | ICD-10-CM | POA: Diagnosis not present

## 2023-09-13 DIAGNOSIS — K7460 Unspecified cirrhosis of liver: Secondary | ICD-10-CM

## 2023-09-13 DIAGNOSIS — M7989 Other specified soft tissue disorders: Secondary | ICD-10-CM | POA: Diagnosis not present

## 2023-09-13 NOTE — Progress Notes (Signed)
 MRN : [id number]  Devin Hale is a 73 y.o. ([birth year]) male who presents with chief complaint of  Chief Complaint  Patient presents with   Follow-up    wants to consider the left leg laser ablation  .  History of Present Illness: Patient returns today in follow up of his venous disease with a new problem of the lymphedema.  We had previously seen him and discussed right great saphenous vein ablation.  Ultimately he had seen physicians in the [facility] area and eventually had what sounds like a radiofrequency ablation in the right great saphenous vein as well as some sclerotherapy treatments.  Although this reduced the varicosities, he developed lymphedema in the right leg and has been dealing with that.  He has been prescribed a lymphedema pump by that practice and is awaiting his arrival.  He had wanted to have his right great saphenous vein ablated here last year, but given our supply issues of obtaining the laser kits with [facility], we had a backlog and ultimately it was long enough that he chose to have the procedure done in [place] which was certainly reasonable.  He has known left great saphenous vein reflux and was considering left great saphenous vein laser ablation there as well.  He would desire to have that done here closer to home if possible.  Current Outpatient Medications  Medication Sig Dispense Refill   apixaban (ELIQUIS) 2.5 MG TABS tablet Take by mouth 2 (two) times daily.     ascorbic acid (VITAMIN C) 500 MG tablet Take 500 mg by mouth 2 (two) times daily.      B Complex-Biotin-FA (VITAMIN B50 COMPLEX PO) Take by mouth daily.     cholecalciferol (VITAMIN D3) 25 MCG (1000 UNIT) tablet Take 1,000 Units by mouth in the morning and at bedtime.      Coenzyme Q10 (CO Q 10) 100 MG CAPS Take 100 mg by mouth daily.     denosumab (PROLIA) 60 MG/ML SOSY injection Inject 60 mg into the skin every 6 (six) months.     magnesium oxide (MAG-OX) 400 MG tablet Take 200 mg by mouth 2 (two)  times daily.     metroNIDAZOLE (METROGEL) 1 % gel Apply 1 Application topically daily as needed.     Multiple Vitamin (MULTIVITAMIN WITH MINERALS) TABS tablet Take 1 tablet by mouth daily.     Omega-3 Fatty Acids (FISH OIL) 1000 MG CAPS Take 2 capsules by mouth daily.     vitamin E 200 UNIT capsule Take 200 Units by mouth daily.     Whey Protein POWD Take 15 g by mouth daily.     PREDNISONE PO Take 7.5 mg by mouth daily.     No current facility-administered medications for this visit.    Past Medical History:  Diagnosis Date   Complete heart block (HCC)    Hepatitis    Hepatitis C    Pacemaker     Past Surgical History:  Procedure Laterality Date   COLONOSCOPY WITH PROPOFOL N/A 05/27/2020   Procedure: COLONOSCOPY WITH PROPOFOL;  Surgeon: Midge Minium, MD;  Location: [facility] ENDOSCOPY;  Service: Endoscopy;  Laterality: N/A;   ESOPHAGOGASTRODUODENOSCOPY (EGD) WITH PROPOFOL N/A 05/27/2020   Procedure: ESOPHAGOGASTRODUODENOSCOPY (EGD) WITH PROPOFOL;  Surgeon: Midge Minium, MD;  Location: [facility] ENDOSCOPY;  Service: Endoscopy;  Laterality: N/A;   ESOPHAGOGASTRODUODENOSCOPY (EGD) WITH PROPOFOL N/A 10/07/2022   Procedure: ESOPHAGOGASTRODUODENOSCOPY (EGD) WITH PROPOFOL;  Surgeon: Midge Minium, MD;  Location: [facility] ENDOSCOPY;  Service: Endoscopy;  Laterality:  N/A;  9:30 ARRIVAL TIME DUE TO TRANSPORTATION   IR IVC FILTER RETRIEVAL / S&I /IMG GUID/MOD SED  08/30/2022   IR RADIOLOGIST EVAL & MGMT  08/18/2022   KNEE ARTHROSCOPY Left 1982   LIVER BIOPSY     NASAL SINUS SURGERY  2019   TEMPORARY PACEMAKER N/A 08/13/2019   Procedure: TEMPORARY PACEMAKER;  Surgeon: Iran Ouch, MD;  Location: [facility] INVASIVE CV LAB;  Service: Cardiovascular;  Laterality: N/A;   TONSILLECTOMY       Social History   Tobacco Use   Smoking status: Never   Smokeless tobacco: Never  Vaping Use   Vaping status: Never Used  Substance Use Topics   Alcohol use: No   Drug use: Never      Family History  Problem  Relation Age of Onset   Non-Hodgkin's lymphoma Mother    Heart attack Father   No bleeding or clotting disorders  No Known Allergies   REVIEW OF SYSTEMS (Negative unless checked)   Constitutional: [] Weight loss  [] Fever  [] Chills Cardiac: [] Chest pain   [] Chest pressure   [] Palpitations   [] Shortness of breath when laying flat   [] Shortness of breath at rest   [] Shortness of breath with exertion. Vascular:  [x] Pain in legs with walking   [x] Pain in legs at rest   [] Pain in legs when laying flat   [] Claudication   [] Pain in feet when walking  [] Pain in feet at rest  [] Pain in feet when laying flat   [x] History of DVT   [] Phlebitis   [x] Swelling in legs   [x] Varicose veins   [] Non-healing ulcers Pulmonary:   [] Uses home oxygen   [] Productive cough   [] Hemoptysis   [] Wheeze  [] COPD   [] Asthma Neurologic:  [] Dizziness  [] Blackouts   [] Seizures   [] History of stroke   [] History of TIA  [] Aphasia   [] Temporary blindness   [] Dysphagia   [] Weakness or numbness in arms   [] Weakness or numbness in legs Musculoskeletal:  [] Arthritis   [] Joint swelling   [] Joint pain   [] Low back pain Hematologic:  [] Easy bruising  [] Easy bleeding   [] Hypercoagulable state   [] Anemic  [] Hepatitis Gastrointestinal:  [] Blood in stool   [] Vomiting blood  [] Gastroesophageal reflux/heartburn   [] Abdominal pain Genitourinary:  [] Chronic kidney disease   [] Difficult urination  [] Frequent urination  [] Burning with urination   [] Hematuria Skin:  [] Rashes   [] Ulcers   [] Wounds Psychological:  [] History of anxiety   []  History of major depression.  Physical Examination  BP 111/71   Pulse 72   Resp 18   Ht 5\' 9"  (1.753 m)   Wt 172 lb 12.8 oz (78.4 kg)   BMI 25.52 kg/m  Gen:  WD/WN, NAD. Appears younger than stated age. Head: [place]/AT, No temporalis wasting. Ear/Nose/Throat: Hearing grossly intact, nares w/o erythema or drainage Eyes: Conjunctiva clear. Sclera non-icteric Neck: Supple.  Trachea midline Pulmonary:  Good air  movement, no use of accessory muscles.  Cardiac: RRR, no JVD Vascular:  Vessel Right Left  Radial Palpable Palpable       Musculoskeletal: M/S 5/5 throughout.  No deformity or atrophy. 1-2+ RLE edema, trace LLE edema. Moderate stasis dermatitis changes present bilaterally Neurologic: Sensation grossly intact in extremities.  Symmetrical.  Speech is fluent.  Psychiatric: Judgment intact, Mood & affect appropriate for pt's clinical situation. Dermatologic: No rashes or ulcers noted.  No cellulitis or open wounds.      Labs Recent Results (from the past 2160 hours)  CUP PACEART  REMOTE DEVICE CHECK     Status: None   Collection Time: 09/02/23  2:00 AM  Result Value Ref Range   Date Time Interrogation Session [phone]    Pulse Generator Manufacturer [employer]    Pulse Gen Model 2272 Assurity MRI    Pulse Gen Serial Number [id number]    Clinic Name [facility]    Implantable Pulse Generator Type Implantable Pulse Generator    Implantable Pulse Generator Implant Date [id number]    Implantable Lead Manufacturer BOST    Implantable Lead Model 7842 Ingevity + MRI    Implantable Lead Serial Number [id number]    Implantable Lead Implant Date [id number]    Implantable Lead Location Detail 1 UNKNOWN    Implantable Lead Location [id number]    Implantable Lead Connection Status [id number]    Implantable Lead Manufacturer [facility]    Implantable Lead Model [id number] Tendril STS    Implantable Lead Implant Date [id number]    Implantable Lead Location Detail 1 UNKNOWN    Implantable Lead Location [id number]    Implantable Lead Connection Status [id number]    Lead Channel Setting Sensing Sensitivity 2.0 mV   Lead Channel Setting Sensing Adaptation Mode Fixed Pacing    Lead Channel Setting Pacing Amplitude 2.5 V   Lead Channel Setting Pacing Pulse Width 1.0 ms   Lead Channel Setting Pacing Amplitude 1.375    Lead Channel Status NULL    Lead Channel Impedance Value 380 ohm   Lead Channel Sensing Intrinsic Amplitude 2.9  mV   Lead Channel Pacing Threshold Amplitude 1.0 V   Lead Channel Pacing Threshold Pulse Width 1.0 ms   Lead Channel Status NULL    Lead Channel Impedance Value 510 ohm   Lead Channel Sensing Intrinsic Amplitude 5.4 mV   Lead Channel Pacing Threshold Amplitude 1.125 V   Lead Channel Pacing Threshold Pulse Width 1.0 ms   Battery Status MOS    Battery Remaining Longevity 41 mo   Battery Remaining Percentage 44.0 %   Battery Voltage 2.98 V   Brady Statistic RA Percent Paced 26.0 %   Brady Statistic RV Percent Paced 98.0 %   Brady Statistic AP VP Percent 28.0 %   Brady Statistic AS VP Percent 70.0 %   Brady Statistic AP VS Percent 1.0 %   Brady Statistic AS VS Percent 1.0 %    Radiology CUP PACEART REMOTE DEVICE CHECK Result Date: 09/02/2023 Scheduled remote reviewed. Normal device function.  Presenting rhythm: AP/VP 60 bpm. Next remote 91 days. MC, CVRS   Assessment/Plan  Varicose veins of leg with swelling, bilateral The patient has undergone radiofrequency ablation and sclerotherapy injections on the right leg but subsequently has developed lymphedema.  He still has symptomatic reflux in the left great saphenous vein and is interested in having that treated.  I discussed the risk and benefits of laser ablation of the left great saphenous vein.  He desires to have this performed and we will begin the approval process.  Lymphedema The patient has developed lymphedema from chronic scarring the lymphatic channels in the right leg and we told him that ultimately this would likely affect the left leg as well.  I would recommend continued use of compression socks and elevation.  I agree with the addition of the lymphedema pump.  Swelling of limb Largely from venous disease initially but has now developed lymphedema with marked swelling in the right leg.  I would agree with continued compression, elevation, and exercise and the addition of a lymphedema pump.  Sounds like he already has a  lymphedema pump on the way.  I would recommend using this at least daily.  We can follow him for this going forward.   Cirrhosis of liver without ascites (HCC) Can certainly contribute to LE swelling.   Festus Barren, MD  09/13/2023 4:55 PM    This note was created with Dragon medical transcription system.  Any errors from dictation are purely unintentional

## 2023-09-13 NOTE — Assessment & Plan Note (Signed)
 The patient has undergone radiofrequency ablation and sclerotherapy injections on the right leg but subsequently has developed lymphedema.  He still has symptomatic reflux in the left great saphenous vein and is interested in having that treated.  I discussed the risk and benefits of laser ablation of the left great saphenous vein.  He desires to have this performed and we will begin the approval process.

## 2023-09-13 NOTE — Assessment & Plan Note (Signed)
 The patient has developed lymphedema from chronic scarring the lymphatic channels in the right leg and we told him that ultimately this would likely affect the left leg as well.  I would recommend continued use of compression socks and elevation.  I agree with the addition of the lymphedema pump.

## 2023-09-16 ENCOUNTER — Telehealth (INDEPENDENT_AMBULATORY_CARE_PROVIDER_SITE_OTHER): Payer: Self-pay

## 2023-09-16 NOTE — Telephone Encounter (Addendum)
 Patient called requesting a referral to the lymph clinic here at [facility] to [facility]. His insurance doesn't require it, but the clinic does.

## 2023-09-16 NOTE — Telephone Encounter (Signed)
 Patient called about getting medical documents transferred here to Dr. Wyn Quaker from [facility]. Can someone explain to the patient the process of record transferring from one office to another. I'm not 100% sure.   Thanks

## 2023-09-16 NOTE — Telephone Encounter (Signed)
 LVM for pt to have records faxed to [phone].

## 2023-09-23 ENCOUNTER — Telehealth (INDEPENDENT_AMBULATORY_CARE_PROVIDER_SITE_OTHER): Payer: Self-pay

## 2023-09-23 ENCOUNTER — Other Ambulatory Visit (INDEPENDENT_AMBULATORY_CARE_PROVIDER_SITE_OTHER): Payer: Self-pay | Admitting: Nurse Practitioner

## 2023-09-23 DIAGNOSIS — I890 Lymphedema, not elsewhere classified: Secondary | ICD-10-CM

## 2023-09-23 NOTE — Telephone Encounter (Signed)
 I have sent the referral to the lymphedema clinic.  As far as the laser ablation, that is still in the pending approval process.  I would contact the patient regarding the option that [facility] gave you regarding a records transfer and see which he would like to do.

## 2023-09-23 NOTE — Telephone Encounter (Signed)
 Devin Hale called yesterday about his referral to the Lymphedema clinic referral, his laser ablation procedure that was discussed at his appt. As well as his records being transferred. I called [place] to [facility] to see what his options were with me helping him get his records. Mercedes informed me has has access through their portal or we can send them a signed medical release form from [place] or he can sign one of the theirs.   The fax number is [phone]  ATTENTION MEDICAL RECORDS or ATTENTION [place]

## 2023-09-23 NOTE — Telephone Encounter (Signed)
 Patient is currently filling out the medical release form& referral message given

## 2023-09-26 ENCOUNTER — Telehealth (INDEPENDENT_AMBULATORY_CARE_PROVIDER_SITE_OTHER): Payer: Self-pay | Admitting: Nurse Practitioner

## 2023-09-26 NOTE — Telephone Encounter (Signed)
 LVM for pt regarding his VM about the lymphedema clinic not having his referral. I advised that the referral is in the system and I am not sure why the clinic is having problems with it. I advised to call back with any questions.

## 2023-09-28 ENCOUNTER — Encounter: Payer: Self-pay | Admitting: Occupational Therapy

## 2023-09-28 ENCOUNTER — Ambulatory Visit: Attending: Nurse Practitioner | Admitting: Occupational Therapy

## 2023-09-28 DIAGNOSIS — I890 Lymphedema, not elsewhere classified: Secondary | ICD-10-CM | POA: Diagnosis present

## 2023-09-28 NOTE — Therapy (Signed)
 OUTPATIENT OCCUPATIONAL THERAPY  EVALUATION  LOWER EXTREMITY LYMPHEDEMA   Patient Name: Devin Hale MRN: [id number] DOB:1950-07-26, 73 y.o., male Today's Date: 10/07/2023  END OF SESSION:   OT End of Session - 10/06/23 1347     Visit Number 1    Number of Visits 36    Date for OT Re-Evaluation 12/27/23    OT Start Time 0305    OT Stop Time 0415    OT Time Calculation (min) 70 min    Activity Tolerance Patient tolerated treatment well;No increased pain    Behavior During Therapy WFL for tasks assessed/performed               Past Medical History:  Diagnosis Date   Complete heart block (HCC)    Hepatitis    Hepatitis C    Pacemaker    Past Surgical History:  Procedure Laterality Date   COLONOSCOPY WITH PROPOFOL  N/A 05/27/2020   Procedure: COLONOSCOPY WITH PROPOFOL ;  Surgeon: Marnee Sink, MD;  Location: [facility];  Service: Endoscopy;  Laterality: N/A;   ESOPHAGOGASTRODUODENOSCOPY (EGD) WITH PROPOFOL  N/A 05/27/2020   Procedure: ESOPHAGOGASTRODUODENOSCOPY (EGD) WITH PROPOFOL ;  Surgeon: Marnee Sink, MD;  Location: [facility];  Service: Endoscopy;  Laterality: N/A;   ESOPHAGOGASTRODUODENOSCOPY (EGD) WITH PROPOFOL  N/A 10/07/2022   Procedure: ESOPHAGOGASTRODUODENOSCOPY (EGD) WITH PROPOFOL ;  Surgeon: Marnee Sink, MD;  Location: [facility];  Service: Endoscopy;  Laterality: N/A;  9:30 ARRIVAL TIME DUE TO TRANSPORTATION   IR IVC FILTER RETRIEVAL / S&I /IMG GUID/MOD SED  08/30/2022   IR RADIOLOGIST EVAL & MGMT  08/18/2022   KNEE ARTHROSCOPY Left 1982   LIVER BIOPSY     NASAL SINUS SURGERY  2019   TEMPORARY PACEMAKER N/A 08/13/2019   Procedure: TEMPORARY PACEMAKER;  Surgeon: Wenona Hamilton, MD;  Location: [facility];  Service: Cardiovascular;  Laterality: N/A;   TONSILLECTOMY     Patient Active Problem List   Diagnosis Date Noted   Varicose veins of leg with swelling, bilateral 09/13/2023   Lymphedema 09/13/2023   Varicose veins of leg with pain,  right 11/01/2022   Cirrhosis of liver without ascites (HCC)    Colon cancer screening    Atrial fibrillation (HCC) 05/22/2020   Cardiac pacemaker in situ 04/03/2020   Hepatocellular carcinoma (HCC) 04/03/2020   Other acute postprocedural pain 04/03/2020   Myopathy 03/28/2020   Chronic pain of both knees 03/24/2020   Chronic pain syndrome 03/24/2020   Stimulation of muscle pocket due to and not concurrent with implantation of cardiac pacemaker 01/06/2020   Pericardial effusion 10/04/2019   Chest pain with high risk for cardiac etiology 09/25/2019   AV block, 3rd degree (HCC) 08/13/2019   Complete heart block by electrocardiogram (HCC) 08/13/2019   Chronic rhinitis 01/30/2019   Nasal polyps 01/30/2019   Generalized osteoarthritis 01/22/2019   Age related osteoporosis 01/15/2019   Closed compression fracture of third lumbar vertebra (HCC) 01/15/2019   Chronic venous insufficiency 08/13/2018   Hepatitis C 07/28/2018   Swelling of limb 07/28/2018   Portal hypertension (HCC) 07/19/2018   Myelopathy (HCC) 06/12/2018   Osteoarthritis of knee, unspecified 05/16/2018   Lumbar facet arthropathy 04/30/2018   Long term current use of opiate analgesic 04/30/2018   Cervical spinal stenosis 02/15/2018   Neck pain 02/15/2018   Muscular weakness 02/03/2018   Myalgia 02/03/2018   Bilateral primary osteoarthritis of knee 11/23/2017   Patellar instability of both knees 11/23/2017   Bilateral edema of lower extremity 11/10/2017   Positive ANA (antinuclear antibody)  11/10/2017   Polyarthralgia 11/10/2017   Anxiety 10/25/2017   Other specified abnormal findings of blood chemistry 10/25/2017    PCP: Alexa Andrews, MD  REFERRING PROVIDER: Sharla Davis, NP  REFERRING DIAG: I89.0  THERAPY DIAG:  Lymphedema, not elsewhere classified  Rationale for Evaluation and Treatment: Rehabilitation  ONSET DATE: ~2018; gradual, insidious onset .  SUBJECTIVE:                                                                                                                                                                                           SUBJECTIVE STATEMENT: Devin Hale is referred to Occupational Therapy by Sharla Davis, NP for evaluation and treatment of BLR Lymphedema. Pt reports gradual onset of leg swelling, R>L, w worsening over time. Pt experienced exacerbation of R leg swelling after R great saphenous vein ablation in 2024. Pt reportedly uses a vaso pneumatic compression device, or "pump" regularly, but not daily. Pt does not wear compression garments to clinic.   PERTINENT HISTORY:   PAIN: tightness and irritation of skin  PRECAUTIONS: ICD/Pacemaker and Other: Lymphedema Precautions   WEIGHT BEARING RESTRICTIONS: No  FALLS:  Has patient fallen in last 6 months? No  LIVING ENVIRONMENT: Lives with: lives alone Lives in: House/apartment Stairs: No; External: 5 steps; on right going up and on left going up Has following equipment at home: None  OCCUPATION: Retired from [employer]  LEISURE: watch TV, read  HAND DOMINANCE: right   PRIOR LEVEL OF FUNCTION: Independent  PATIENT GOALS: Get swelling down in my legs, so I can wear shorts and be more active   OBJECTIVE: Note: Objective measures were completed at Evaluation unless otherwise noted.  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  SENSATION: WFL Denies numbness  POSTURE: WNL  LE ROM: WFL  LE MMT: TBA  LYMPHEDEMA ASSESSMENTS:   SURGERY TYPE/DATE: Non-Ca related- NA  INFECTIONS: Denies hx cellulitis  WOUNDS: Denies   BLE COMPARATIVE LIMB VOLUMETRICS TBA OT Rx 1  LANDMARK RIGHT   R LEG (A-D) N/A  R THIGH (E-G) ml  R FULL LIMB (A-G) ml  Limb Volume differential (LVD)  %  Volume change since initial %  Volume change overall V  (Blank rows = not tested)  LANDMARK LEFT    L  LEG (A-D) N/A  L THIGH (E-G) ml  L FULL LIMB (A-G) ml  Limb Volume differential (LVD)  %  Volume change since initial %  Volume change overall %  (Blank rows = not tested)    Mild, Stage  II, Bilateral Lower Extremity Lymphedema 2/2 CVI and Obesity  Skin  Description Hyper-Keratosis Peau d'  Orange Shiny Tight Fibrotic/ Indurated Fatty Doughy Spongy/ boggy      x mild   x   Skin dry Flaky WNL Macerated   mildly x     Color Redness Varicosities Blanching Hemosiderin Stain Mottled   x x x  x    Odor Malodorous Yeast Fungal infection  WNL      x   Temperature Warm Cool wnl    x     Pitting Edema   1+ 2+ 3+ 4+ Non-pitting   x         Girth Symmetrical Asymmetrical                   Distribution    x toes to groin    Stemmer Sign Positive Negative   +    Lymphorrhea History Of:  Present Absent     x    Wounds History Of Present Absent Venous Arterial Pressure Sheer     x        Signs of Infection Redness Warmth Erythema Acute Swelling Drainage Borders                    Sensation Light Touch Deep pressure Hypersensitivity   Present Impaired Present Impaired Absent Impaired   x x  x  x     Nails WNL   Fungus nail dystrophy     x   Hair Growth Symmetrical Asymmetrical       Skin Creases Base of toes  Ankles   Base of Fingers knees       Abdominal pannus Thigh Lobules  Face/neck   x x           LYMPHEDEMA LIFE IMPACT SCALE (LLIS):32.35% (The extent to which LE-related problems impacted your life last week.)  TREATMENT DATE: 09/28/23 OT Evaluation Pt edu  PATIENT EDUCATION:   Discussed differential diagnoses for various swelling disorders. Provided basic level education regarding lymphatic structure and function, etiology, onset patterns, stages of progression, and prevention to limit infection risk, worsening condition and further functional decline. Pt edu for aught interaction between blood circulatory system and lymphatic  circulation.Discussed  impact of gravity and co-morbidities on lymphatic function. Outlined Complete Decongestive Therapy (CDT)  as standard of care and provided in depth information regarding 4 primary components of Intensive and Self Management Phases, including Manual Lymph Drainage (MLD), compression wrapping and garments, skin care, and therapeutic exercise. Devin Hale discussion with re need for frequent attendance and high burden of care when caregiver is needed, impact of co morbidities. We discussed  the chronic, progressive nature of lymphedema and Importance of daily, ongoing LE self-care essential for limiting progression and infection risk.  Person educated: Patient  Education method: Explanation, Demonstration, and Handouts Education comprehension: verbalized understanding, returned demonstration, verbal cues required, and needs further education  LYMPHEDEMA SELF-CARE HOME PROGRAM: BLE lymphatic pumping there ex using- 1 sets of 10 reps, each exercise in order-  1-2 x daily, bilaterally Simple self MLD 1 x daily Daily skin care to increase hydration, skin mobility and decrease infection risk- can be done during MLD During Intensive Phase of CDT-Compression wraps 23/7 until garment fitting complete  Appropriate daytime compression garments full time. Consider HOS garments/ devices to reduce fibrosis formation and LE progression  Custom-made gradient compression garments and hours-of-sleep devices are medically necessary because they are uniquely sized and shaped to fit the  exact dimensions of the affected extremities and to provide accurate and consistent gradient compression and containment, essential  for optimally managing chronic, progressive lymphedema. The convoluted HOS devices are medically necessary to facilitate increased lymphatic circulation and limit fibrosis formation when sleeping. Multiple custom compression garments are needed for optimal hygiene to limit infection risk. Custom  compression garments should be replaced q 3-6 months When worn consistently for optimal lymphedema self-management over time.  ASSESSMENT:  CLINICAL IMPRESSION: Devin Hale is a 73 y.o. male presenting with chronic, progressive, mild-moderate, stage II, BLE lymphedema secondary to 2/2 chronic venous insufficiency. His condition is exacerbated by AFIB, OA and chronic pain.  Lymphedema, related pain and associated skin changes limit functional performance in all occupational domains, including functional ambulation and mobility, basic and instrumental ADLs, productive activities and leisure pursuits, social participation and body image.  BLE lymphedema also contributes to increased infection risk and increased risk of falls due to body asymmetry. Devin Hale will benefit from skilled OT for Complete Decongestive Therapy (CDT) including manual lymphatic drainage (MLD), skin care, lymphatic pumping exercise, and compression. During the Intensive Phase of CDT one leg at a time will be wrapped with short stretch bandages below the knee 23/7 to reduce limb volume. Once limb volume is reduced, Pt will be fitted with appropriate compression garment, or an alternative, that is easier to don and doff. Once CDT is complete on the second leg  Pt will transition to Self-management Phase of CDT and OT will provide follow along support PRN. Because Devin Hale has some difficulty reaching his feet applying essential, multilayer compression wraps may require caregiver assistance. Without skilled OT for lymphedema care, Pt's condition will progress and further functional decline is expected  OBJECTIVE IMPAIRMENTS: Abnormal gait, decreased endurance, decreased knowledge of condition, decreased knowledge of use of DME, decreased mobility, difficulty walking, decreased ROM, decreased strength, increased edema, impaired flexibility, impaired sensation, pain, and chronic leg swelling and associated skin changes .   ACTIVITY LIMITATIONS:  Basic and instrumental ADLs ( reaching feet to bathe and groom nails, inspect skin and perform LB skin care, fitting LB clothing and preferred street shoes, meal prep, cleaning, driving, shopping and yard work) Functional ambulation and mobility (carrying, lifting, bending, sitting, standing, squatting, and car transfers) Leisure pursuits (walking for exercise), productive activities (caring for others), social participation (community activities) , body image  PERSONAL FACTORS: Age, Past/current experiences, Time since onset of injury/illness/exacerbation, and 3+ comorbidities: varicose veins, OA, chronic pain  are also affecting patient's functional outcome.   REHAB POTENTIAL: Good  EVALUATION COMPLEXITY: Moderate   GOALS: Goals reviewed with patient? Yes  GOALS: Goals reviewed with patient? Yes   SHORT TERM GOALS: Target date: 4th OT Rx visit    Pt will demonstrate understanding of lymphedema precautions and prevention strategies with modified independence using a printed reference to identify at least 5 precautions and discussing how s/he may implement them into daily life to reduce risk of progression with modified assistance Baseline: max a Goal status: INITIAL   2.  With modified independence (extra time) Pt will be able to apply multilayer, knee length, compression wraps using gradient techniques to decrease limb volume, to limit infection risk, and to limit lymphedema progression.   Baseline: Dependent Goal status: INITIAL   LONG TERM GOALS: Target date: 12/27/23  (12 WEEKS)     Given this patient's Intake score of 32.35 % on the Lymphedema Life Impact Scale (LLIS), patient will experience a reduction of at least 5% in her  perceived level of functional impairment resulting from lymphedema to improve functional performance and quality of life (QOL).Baseline: tbd Baseline: max a Goal status: INITIAL   2.  With modified independence (extra time and assistive devices) Pt will be  able to don and doff appropriate compression garments and/or devices to control BLE lymphedema and to limit progression.  Baseline: Dependent Goal status: INITIAL   3. Pt will achieve at least a 10% volume reductions bilaterally below the knees to return limb to more typical size and shape, to limit infection risk and LE progression, to decrease pain, to improve function, and to improve body image and QOL. Baseline: Dependent Goal status: INITIAL   4. Pt will achieve and sustain at least 85%  compliance with all LE self-care home program components throughout CDT, including modified simple self-MLD, daily skin care and inspection, lymphatic pumping the ex and appropriate compression to limit lymphedema progression and to limit further functional decline. Baseline: Dependent Goal status: INITIAL    PLAN:  OT FREQUENCY: 2x/week and PRN  OT DURATION: 12 weeks and PRN  PLANNED INTERVENTIONS: 97110-Therapeutic exercises, 97530- Therapeutic activity, 97535- Self Care, [id number]- Manual therapy, Patient/Family education, Manual lymph drainage, Compression bandaging, and skin care simultaneous w MLD, fit w compression garments  PLAN FOR NEXT SESSION:  BLE comparative limb volumetrics Knee length wraps to one l at a time only Pt edu   Arnold Bicker, MS, OTR/L, CLT-LANA 10/07/23 11:12 AM

## 2023-09-29 ENCOUNTER — Ambulatory Visit: Admitting: Occupational Therapy

## 2023-10-03 ENCOUNTER — Encounter: Payer: Self-pay | Admitting: Internal Medicine

## 2023-10-04 ENCOUNTER — Ambulatory Visit: Admitting: Occupational Therapy

## 2023-10-05 ENCOUNTER — Telehealth (INDEPENDENT_AMBULATORY_CARE_PROVIDER_SITE_OTHER): Payer: Self-pay | Admitting: Nurse Practitioner

## 2023-10-05 NOTE — Progress Notes (Signed)
 Remote pacemaker transmission.

## 2023-10-05 NOTE — Telephone Encounter (Signed)
 LVM for pt to call [facility] back at [phone].   Nurse visit  lymph pump demo with leah per fb

## 2023-10-06 ENCOUNTER — Encounter: Payer: Self-pay | Admitting: Occupational Therapy

## 2023-10-07 ENCOUNTER — Ambulatory Visit: Admitting: Occupational Therapy

## 2023-10-11 ENCOUNTER — Ambulatory Visit: Admit: 2023-10-11 | Discharge: 2023-10-12 | Payer: Medicare (Managed Care)

## 2023-10-11 DIAGNOSIS — M810 Age-related osteoporosis without current pathological fracture: Principal | ICD-10-CM

## 2023-10-17 ENCOUNTER — Inpatient Hospital Stay: Admit: 2023-10-17 | Discharge: 2023-10-18 | Payer: Medicare (Managed Care)

## 2023-10-19 ENCOUNTER — Inpatient Hospital Stay
Admit: 2023-10-19 | Discharge: 2023-10-19 | Payer: Medicare (Managed Care) | Attending: Radiation Oncology | Primary: Radiation Oncology

## 2023-10-19 ENCOUNTER — Encounter: Admitting: Occupational Therapy

## 2023-10-20 ENCOUNTER — Encounter: Admitting: Physical Therapy

## 2023-10-25 ENCOUNTER — Inpatient Hospital Stay
Admit: 2023-10-25 | Discharge: 2023-10-26 | Payer: Medicare (Managed Care) | Attending: Radiation Oncology | Primary: Radiation Oncology

## 2023-10-26 ENCOUNTER — Telehealth (INDEPENDENT_AMBULATORY_CARE_PROVIDER_SITE_OTHER): Payer: Self-pay | Admitting: Vascular Surgery

## 2023-10-26 ENCOUNTER — Ambulatory Visit: Admitting: Occupational Therapy

## 2023-10-26 NOTE — Telephone Encounter (Signed)
 NA

## 2023-10-28 ENCOUNTER — Ambulatory Visit: Attending: Nurse Practitioner | Admitting: Occupational Therapy

## 2023-10-28 ENCOUNTER — Encounter: Payer: Self-pay | Admitting: Occupational Therapy

## 2023-10-28 DIAGNOSIS — I890 Lymphedema, not elsewhere classified: Secondary | ICD-10-CM | POA: Diagnosis present

## 2023-10-28 NOTE — Therapy (Signed)
 OUTPATIENT OCCUPATIONAL THERAPY  EVALUATION  LOWER EXTREMITY LYMPHEDEMA   Patient Name: Orey Haider MRN: [id number] DOB:[birth year], 73 y.o., male Today's Date: 10/28/2023  END OF SESSION:   OT End of Session - 10/28/23 1007     Visit Number 2    Number of Visits 36    Date for OT Re-Evaluation 12/27/23    OT Start Time 1005    Activity Tolerance Patient tolerated treatment well;No increased pain    Behavior During Therapy WFL for tasks assessed/performed               Past Medical History:  Diagnosis Date   Complete heart block (HCC)    Hepatitis    Hepatitis C    Pacemaker    Past Surgical History:  Procedure Laterality Date   COLONOSCOPY WITH PROPOFOL  N/A 05/27/2020   Procedure: COLONOSCOPY WITH PROPOFOL ;  Surgeon: Marnee Sink, MD;  Location: [facility];  Service: Endoscopy;  Laterality: N/A;   ESOPHAGOGASTRODUODENOSCOPY (EGD) WITH PROPOFOL  N/A 05/27/2020   Procedure: ESOPHAGOGASTRODUODENOSCOPY (EGD) WITH PROPOFOL ;  Surgeon: Marnee Sink, MD;  Location: [facility];  Service: Endoscopy;  Laterality: N/A;   ESOPHAGOGASTRODUODENOSCOPY (EGD) WITH PROPOFOL  N/A 10/07/2022   Procedure: ESOPHAGOGASTRODUODENOSCOPY (EGD) WITH PROPOFOL ;  Surgeon: Marnee Sink, MD;  Location: [facility];  Service: Endoscopy;  Laterality: N/A;  9:30 ARRIVAL TIME DUE TO TRANSPORTATION   IR IVC FILTER RETRIEVAL / S&I /IMG GUID/MOD SED  08/30/2022   IR RADIOLOGIST EVAL & MGMT  08/18/2022   KNEE ARTHROSCOPY Left 1982   LIVER BIOPSY     NASAL SINUS SURGERY  2019   TEMPORARY PACEMAKER N/A 08/13/2019   Procedure: TEMPORARY PACEMAKER;  Surgeon: Wenona Hamilton, MD;  Location: [facility];  Service: Cardiovascular;  Laterality: N/A;   TONSILLECTOMY     Patient Active Problem List   Diagnosis Date Noted   Varicose veins of leg with swelling, bilateral 09/13/2023   Lymphedema 09/13/2023   Varicose veins of leg with pain, right 11/01/2022   Cirrhosis of liver without ascites (HCC)     Colon cancer screening    Atrial fibrillation (HCC) 05/22/2020   Cardiac pacemaker in situ 04/03/2020   Hepatocellular carcinoma (HCC) 04/03/2020   Other acute postprocedural pain 04/03/2020   Myopathy 03/28/2020   Chronic pain of both knees 03/24/2020   Chronic pain syndrome 03/24/2020   Stimulation of muscle pocket due to and not concurrent with implantation of cardiac pacemaker 01/06/2020   Pericardial effusion 10/04/2019   Chest pain with high risk for cardiac etiology 09/25/2019   AV block, 3rd degree (HCC) 08/13/2019   Complete heart block by electrocardiogram (HCC) 08/13/2019   Chronic rhinitis 01/30/2019   Nasal polyps 01/30/2019   Generalized osteoarthritis 01/22/2019   Age related osteoporosis 01/15/2019   Closed compression fracture of third lumbar vertebra (HCC) 01/15/2019   Chronic venous insufficiency 08/13/2018   Hepatitis C 07/28/2018   Swelling of limb 07/28/2018   Portal hypertension (HCC) 07/19/2018   Myelopathy (HCC) 06/12/2018   Osteoarthritis of knee, unspecified 05/16/2018   Lumbar facet arthropathy 04/30/2018   Long term current use of opiate analgesic 04/30/2018   Cervical spinal stenosis 02/15/2018   Neck pain 02/15/2018   Muscular weakness 02/03/2018   Myalgia 02/03/2018   Bilateral primary osteoarthritis of knee 11/23/2017   Patellar instability of both knees 11/23/2017   Bilateral edema of lower extremity 11/10/2017   Positive ANA (antinuclear antibody) 11/10/2017   Polyarthralgia 11/10/2017   Anxiety 10/25/2017   Other specified abnormal findings of  blood chemistry 10/25/2017    PCP: Alexa Andrews, MD  REFERRING PROVIDER: Sharla Davis, NP  REFERRING DIAG: I89.0  THERAPY DIAG:  Lymphedema, not elsewhere classified  Rationale for Evaluation and Treatment: Rehabilitation  ONSET DATE: ~2018; gradual, insidious onset .  SUBJECTIVE:                                                                                                                                                                                           SUBJECTIVE STATEMENT: Daelon Viano presents to OT for initial lymphedema treatment to RLE. Pt denies LE-related leg pain. Pt forgets to bring existing compression stocking to clinic.   PERTINENT HISTORY:   Diagnosis Date Noted   Varicose veins of leg with swelling, bilateral 09/13/2023   Lymphedema 09/13/2023   Varicose veins of leg with pain, right 11/01/2022   Cirrhosis of liver without ascites (HCC)    Colon cancer screening    Atrial fibrillation (HCC) 05/22/2020   Cardiac pacemaker in situ 04/03/2020   Hepatocellular carcinoma (HCC) 04/03/2020   Other acute postprocedural pain 04/03/2020   Myopathy 03/28/2020   Chronic pain of both knees 03/24/2020   Chronic pain syndrome 03/24/2020   Stimulation of muscle pocket due to and not concurrent with implantation of cardiac pacemaker 01/06/2020   Pericardial effusion 10/04/2019   Chest pain with high risk for cardiac etiology 09/25/2019   AV block, 3rd degree (HCC) 08/13/2019   Complete heart block by electrocardiogram (HCC) 08/13/2019   Chronic rhinitis 01/30/2019   Nasal polyps 01/30/2019   Generalized osteoarthritis 01/22/2019   Age related osteoporosis 01/15/2019   Closed compression fracture of third lumbar vertebra (HCC) 01/15/2019   Chronic venous insufficiency 08/13/2018   Hepatitis C 07/28/2018   Swelling of limb 07/28/2018   Portal hypertension (HCC) 07/19/2018   Myelopathy (HCC) 06/12/2018   Osteoarthritis of knee, unspecified 05/16/2018   Lumbar facet arthropathy 04/30/2018   Long term current use of opiate analgesic 04/30/2018   Cervical spinal stenosis 02/15/2018   Neck pain 02/15/2018   Muscular weakness 02/03/2018   Myalgia 02/03/2018   Bilateral primary osteoarthritis of knee 11/23/2017   Patellar instability of both knees 11/23/2017   Bilateral edema of lower extremity 11/10/2017   Positive ANA (antinuclear antibody) 11/10/2017    Polyarthralgia 11/10/2017   Anxiety 10/25/2017   Other specified abnormal findings of blood chemistry 10/25/2017    PAIN: tightness and irritation of skin  PRECAUTIONS: ICD/Pacemaker and Other: Lymphedema Precautions   WEIGHT BEARING RESTRICTIONS: No  FALLS:  Has patient fallen in last 6 months? No  LIVING ENVIRONMENT: Lives with: lives alone Lives in: House/apartment Stairs: No; External: 5  steps; on right going up and on left going up Has following equipment at home: None  OCCUPATION: Retired from [employer]  LEISURE: watch TV, read  HAND DOMINANCE: right   PRIOR LEVEL OF FUNCTION: Independent  PATIENT GOALS: Get swelling down in my legs, so I can wear shorts and be more active   OBJECTIVE: Note: Objective measures were completed at Evaluation unless otherwise noted.  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  SENSATION: WFL Denies numbness  POSTURE: WNL  LE ROM: WFL  LE MMT: TBA  LYMPHEDEMA ASSESSMENTS:   SURGERY TYPE/DATE: Non-Ca related- NA  INFECTIONS: Denies hx cellulitis  WOUNDS: Denies   BLE COMPARATIVE LIMB VOLUMETRICS INITIAL 10/28/23  LANDMARK RIGHT   R LEG (A-D) 3158 ml  R THIGH (E-G) ml  R FULL LIMB (A-G) ml  Limb Volume differential (LVD)  %  Volume change since initial %  Volume change overall V  (Blank rows = not tested)  LANDMARK LEFT    L LEG (A-D) 2946.1  L THIGH (E-G) ml  L FULL LIMB (A-G) ml  Limb Volume differential (LVD)   6.7 %, R>L  Volume change since initial %  Volume change overall %  (Blank rows = not tested)    Mild, Stage  II, Bilateral Lower Extremity Lymphedema 2/2 CVI and Obesity  Skin  Description Hyper-Keratosis Peau d'  Orange Shiny Tight Fibrotic/ Indurated Fatty Doughy Spongy/ boggy      x mild   x   Skin dry Flaky WNL Macerated   mildly x     Color Redness Varicosities  Blanching Hemosiderin Stain Mottled   x x x  x    Odor Malodorous Yeast Fungal infection  WNL      x   Temperature Warm Cool wnl    x     Pitting Edema   1+ 2+ 3+ 4+ Non-pitting   x         Girth Symmetrical Asymmetrical                   Distribution    x toes to groin    Stemmer Sign Positive Negative   +    Lymphorrhea History Of:  Present Absent     x    Wounds History Of Present Absent Venous Arterial Pressure Sheer     x        Signs of Infection Redness Warmth Erythema Acute Swelling Drainage Borders                    Sensation Light Touch Deep pressure Hypersensitivity   Present Impaired Present Impaired Absent Impaired   x x  x  x     Nails WNL   Fungus nail dystrophy     x   Hair Growth Symmetrical Asymmetrical       Skin Creases Base of toes  Ankles   Base of Fingers knees       Abdominal pannus Thigh Lobules  Face/neck   x x           LYMPHEDEMA LIFE IMPACT SCALE (LLIS):32.35% (The extent to which LE-related problems impacted your life last week.)  TREATMENT DATE: 09/28/23 Initial comparative limb volumetrics RLE compression bandaging  PATIENT EDUCATION:  Continued Pt/ CG edu for lymphedema self care home program throughout session. Topics include outcome of comparative limb volumetrics- starting limb volume differentials (  LVDs), technology and gradient techniques used for short stretch, multilayer compression wrapping, simple self-MLD, therapeutic lymphatic pumping exercises, skin/nail care, LE precautions, compression garment recommendations and specifications, wear and care schedule and compression garment donning / doffing w assistive devices. Discussed progress towards all OT goals since commencing CDT. Discussed detrimental impact of obesity on lower and upper extremity lymphedema over time. Reviewed OT goals for lymphedema care with Pt and discussed progress to date.  All questions answered to the Pt's satisfaction. Good  return. Person educated: Patient  Education method: Explanation, Demonstration, and Handouts Education comprehension: verbalized understanding, returned demonstration, verbal cues required, and needs further education    LYMPHEDEMA SELF-CARE HOME PROGRAM: BLE lymphatic pumping there ex using- 1 sets of 10 reps, each exercise in order-  1-2 x daily, bilaterally Simple self MLD 1 x daily Daily skin care to increase hydration, skin mobility and decrease infection risk- can be done during MLD During Intensive Phase of CDT-Compression wraps 23/7 until garment fitting complete  Appropriate daytime compression garments full time. Consider HOS garments/ devices to reduce fibrosis formation and LE progression  Custom-made gradient compression garments and hours-of-sleep devices are medically necessary because they are uniquely sized and shaped to fit the exact dimensions of the affected extremities and to provide accurate and consistent gradient compression and containment, essential  for optimally managing chronic, progressive lymphedema. The convoluted HOS devices are medically necessary to facilitate increased lymphatic circulation and limit fibrosis formation when sleeping. Multiple custom compression garments are needed for optimal hygiene to limit infection risk. Custom compression garments should be replaced q 3-6 months When worn consistently for optimal lymphedema self-management over time.  ASSESSMENT:  CLINICAL IMPRESSION: Initial bilateral limb volumetrics reveal a 6.7% , R (dominant) >L limb volume differential (LVD) below the knees. Commenced CDT to RLE. Provided Pt edu through session re LE self-care home program. Applied multilayer compression wraps to RLE using 1 each 8 and 10 cm wide x 5 meters L short stretch bandages over single layer of cotton stockinett and Rosidal foam. Cont as per POC.   OBJECTIVE IMPAIRMENTS: Abnormal gait, decreased endurance, decreased knowledge of condition,  decreased knowledge of use of DME, decreased mobility, difficulty walking, decreased ROM, decreased strength, increased edema, impaired flexibility, impaired sensation, pain, and chronic leg swelling and associated skin changes.   ACTIVITY LIMITATIONS: Basic and instrumental ADLs ( reaching feet to bathe and groom nails, inspect skin and perform LB skin care, fitting LB clothing and preferred street shoes, meal prep, cleaning, driving, shopping and yard work) Functional ambulation and mobility (carrying, lifting, bending, sitting, standing, squatting, and car transfers) Leisure pursuits (walking for exercise), productive activities (caring for others), social participation (community activities) , body image  PERSONAL FACTORS: Age, Past/current experiences, Time since onset of injury/illness/exacerbation, and 3+ comorbidities: varicose veins, OA, chronic pain are also affecting patient's functional outcome.   REHAB POTENTIAL: Good  EVALUATION COMPLEXITY: Moderate   GOALS: Goals reviewed with patient? Yes  GOALS: Goals reviewed with patient? Yes   SHORT TERM GOALS: Target date: 4th OT Rx visit    Pt will demonstrate understanding of lymphedema precautions and prevention strategies with modified independence using a printed reference to identify at least 5 precautions and discussing how s/he may implement them into daily life to reduce risk of progression with modified assistance Baseline: max a Goal status: INITIAL   2.  With modified independence (extra time) Pt will be able to apply multilayer, knee length, compression wraps using gradient techniques to decrease limb volume,  to limit infection risk, and to limit lymphedema progression.   Baseline: Dependent Goal status: INITIAL   LONG TERM GOALS: Target date: 12/27/23  (12 WEEKS)     Given this patient's Intake score of 32.35 % on the Lymphedema Life Impact Scale (LLIS), patient will experience a reduction of at least 5% in her perceived  level of functional impairment resulting from lymphedema to improve functional performance and quality of life (QOL).Baseline: tbd Baseline: max a Goal status: INITIAL   2.  With modified independence (extra time and assistive devices) Pt will be able to don and doff appropriate compression garments and/or devices to control BLE lymphedema and to limit progression.  Baseline: Dependent Goal status: INITIAL   3. Pt will achieve at least a 10% volume reductions bilaterally below the knees to return limb to more typical size and shape, to limit infection risk and LE progression, to decrease pain, to improve function, and to improve body image and QOL. Baseline: Dependent Goal status: INITIAL   4. Pt will achieve and sustain at least 85%  compliance with all LE self-care home program components throughout CDT, including modified simple self-MLD, daily skin care and inspection, lymphatic pumping the ex and appropriate compression to limit lymphedema progression and to limit further functional decline. Baseline: Dependent Goal status: INITIAL    PLAN:  OT FREQUENCY: 2x/week and PRN  OT DURATION: 12 weeks and PRN  PLANNED INTERVENTIONS: 97110-Therapeutic exercises, 97530- Therapeutic activity, 97535- Self Care, 82956- Manual therapy, Patient/Family education, Manual lymph drainage, Compression bandaging, and skin care simultaneous w MLD, fit w compression garments  PLAN FOR NEXT SESSION:  BLE comparative limb volumetrics Knee length wraps to one l at a time only Pt edu   Arnold Bicker, MS, OTR/L, CLT-LANA 10/28/23 10:07 AM

## 2023-11-03 ENCOUNTER — Ambulatory Visit: Admitting: Occupational Therapy

## 2023-11-07 ENCOUNTER — Ambulatory Visit: Admitting: Occupational Therapy

## 2023-11-08 ENCOUNTER — Encounter: Admitting: Occupational Therapy

## 2023-11-08 ENCOUNTER — Encounter (INDEPENDENT_AMBULATORY_CARE_PROVIDER_SITE_OTHER): Payer: Self-pay

## 2023-11-09 DIAGNOSIS — H6690 Otitis media, unspecified, unspecified ear: Principal | ICD-10-CM

## 2023-11-09 MED ORDER — CIPROFLOXACIN 0.3 %-DEXAMETHASONE 0.1 % EAR DROPS,SUSPENSION
OTIC | 0 refills | 0.00000 days | Status: CP
Start: 2023-11-09 — End: ?

## 2023-11-10 ENCOUNTER — Ambulatory Visit: Admitting: Occupational Therapy

## 2023-11-10 DIAGNOSIS — I890 Lymphedema, not elsewhere classified: Secondary | ICD-10-CM

## 2023-11-10 NOTE — Therapy (Signed)
 OUTPATIENT OCCUPATIONAL THERAPY  EVALUATION  LOWER EXTREMITY LYMPHEDEMA   Patient Name: Cameo Shewell MRN: [id number] DOB:08-Dec-1950, 73 y.o., male Today's Date: 11/10/2023  END OF SESSION:   OT End of Session - 11/10/23 1413     Visit Number 3    Number of Visits 36    Date for OT Re-Evaluation 12/27/23    OT Start Time 0212    Activity Tolerance Patient tolerated treatment well;No increased pain    Behavior During Therapy WFL for tasks assessed/performed               Past Medical History:  Diagnosis Date   Complete heart block (HCC)    Hepatitis    Hepatitis C    Pacemaker    Past Surgical History:  Procedure Laterality Date   COLONOSCOPY WITH PROPOFOL  N/A 05/27/2020   Procedure: COLONOSCOPY WITH PROPOFOL ;  Surgeon: Marnee Sink, MD;  Location: [facility];  Service: Endoscopy;  Laterality: N/A;   ESOPHAGOGASTRODUODENOSCOPY (EGD) WITH PROPOFOL  N/A 05/27/2020   Procedure: ESOPHAGOGASTRODUODENOSCOPY (EGD) WITH PROPOFOL ;  Surgeon: Marnee Sink, MD;  Location: [facility];  Service: Endoscopy;  Laterality: N/A;   ESOPHAGOGASTRODUODENOSCOPY (EGD) WITH PROPOFOL  N/A 10/07/2022   Procedure: ESOPHAGOGASTRODUODENOSCOPY (EGD) WITH PROPOFOL ;  Surgeon: Marnee Sink, MD;  Location: [facility];  Service: Endoscopy;  Laterality: N/A;  9:30 ARRIVAL TIME DUE TO TRANSPORTATION   IR IVC FILTER RETRIEVAL / S&I /IMG GUID/MOD SED  08/30/2022   IR RADIOLOGIST EVAL & MGMT  08/18/2022   KNEE ARTHROSCOPY Left 1982   LIVER BIOPSY     NASAL SINUS SURGERY  2019   TEMPORARY PACEMAKER N/A 08/13/2019   Procedure: TEMPORARY PACEMAKER;  Surgeon: Wenona Hamilton, MD;  Location: [facility];  Service: Cardiovascular;  Laterality: N/A;   TONSILLECTOMY     Patient Active Problem List   Diagnosis Date Noted   Varicose veins of leg with swelling, bilateral 09/13/2023   Lymphedema 09/13/2023   Varicose veins of leg with pain, right 11/01/2022   Cirrhosis of liver without ascites (HCC)     Colon cancer screening    Atrial fibrillation (HCC) 05/22/2020   Cardiac pacemaker in situ 04/03/2020   Hepatocellular carcinoma (HCC) 04/03/2020   Other acute postprocedural pain 04/03/2020   Myopathy 03/28/2020   Chronic pain of both knees 03/24/2020   Chronic pain syndrome 03/24/2020   Stimulation of muscle pocket due to and not concurrent with implantation of cardiac pacemaker 01/06/2020   Pericardial effusion 10/04/2019   Chest pain with high risk for cardiac etiology 09/25/2019   AV block, 3rd degree (HCC) 08/13/2019   Complete heart block by electrocardiogram (HCC) 08/13/2019   Chronic rhinitis 01/30/2019   Nasal polyps 01/30/2019   Generalized osteoarthritis 01/22/2019   Age related osteoporosis 01/15/2019   Closed compression fracture of third lumbar vertebra (HCC) 01/15/2019   Chronic venous insufficiency 08/13/2018   Hepatitis C 07/28/2018   Swelling of limb 07/28/2018   Portal hypertension (HCC) 07/19/2018   Myelopathy (HCC) 06/12/2018   Osteoarthritis of knee, unspecified 05/16/2018   Lumbar facet arthropathy 04/30/2018   Long term current use of opiate analgesic 04/30/2018   Cervical spinal stenosis 02/15/2018   Neck pain 02/15/2018   Muscular weakness 02/03/2018   Myalgia 02/03/2018   Bilateral primary osteoarthritis of knee 11/23/2017   Patellar instability of both knees 11/23/2017   Bilateral edema of lower extremity 11/10/2017   Positive ANA (antinuclear antibody) 11/10/2017   Polyarthralgia 11/10/2017   Anxiety 10/25/2017   Other specified abnormal findings of  blood chemistry 10/25/2017    PCP: Alexa Andrews, MD  REFERRING PROVIDER: Sharla Davis, NP  REFERRING DIAG: I89.0  THERAPY DIAG:  Lymphedema, not elsewhere classified  Rationale for Evaluation and Treatment: Rehabilitation  ONSET DATE: ~2018; gradual, insidious onset .  SUBJECTIVE:                                                                                                                                                                                           SUBJECTIVE STATEMENT: Binyomin Brann presents to OT for lymphedema treatment to RLE. Pt missed last 2 OT visits. We discussed the Rehab attendance policy and OT reminded Pt that if he doesn't attend sessions  his prognosis he will not make progress and prognosis goes from good to poor. Pt denies LE-related leg pain. Pt followed advice and presents to clinic wearing new shoes.   PERTINENT HISTORY:   Diagnosis Date Noted   Varicose veins of leg with swelling, bilateral 09/13/2023   Lymphedema 09/13/2023   Varicose veins of leg with pain, right 11/01/2022   Cirrhosis of liver without ascites (HCC)    Colon cancer screening    Atrial fibrillation (HCC) 05/22/2020   Cardiac pacemaker in situ 04/03/2020   Hepatocellular carcinoma (HCC) 04/03/2020   Other acute postprocedural pain 04/03/2020   Myopathy 03/28/2020   Chronic pain of both knees 03/24/2020   Chronic pain syndrome 03/24/2020   Stimulation of muscle pocket due to and not concurrent with implantation of cardiac pacemaker 01/06/2020   Pericardial effusion 10/04/2019   Chest pain with high risk for cardiac etiology 09/25/2019   AV block, 3rd degree (HCC) 08/13/2019   Complete heart block by electrocardiogram (HCC) 08/13/2019   Chronic rhinitis 01/30/2019   Nasal polyps 01/30/2019   Generalized osteoarthritis 01/22/2019   Age related osteoporosis 01/15/2019   Closed compression fracture of third lumbar vertebra (HCC) 01/15/2019   Chronic venous insufficiency 08/13/2018   Hepatitis C 07/28/2018   Swelling of limb 07/28/2018   Portal hypertension (HCC) 07/19/2018   Myelopathy (HCC) 06/12/2018   Osteoarthritis of knee, unspecified 05/16/2018   Lumbar facet arthropathy 04/30/2018   Long term current use of opiate analgesic 04/30/2018   Cervical spinal stenosis 02/15/2018   Neck pain 02/15/2018   Muscular weakness 02/03/2018   Myalgia 02/03/2018    Bilateral primary osteoarthritis of knee 11/23/2017   Patellar instability of both knees 11/23/2017   Bilateral edema of lower extremity 11/10/2017   Positive ANA (antinuclear antibody) 11/10/2017   Polyarthralgia 11/10/2017   Anxiety 10/25/2017   Other specified abnormal findings of blood chemistry 10/25/2017    PAIN: tightness and irritation of skin  PRECAUTIONS: ICD/Pacemaker and Other: Lymphedema Precautions   WEIGHT BEARING RESTRICTIONS: No  FALLS:  Has patient fallen in last 6 months? No  LIVING ENVIRONMENT: Lives with: lives alone Lives in: House/apartment Stairs: No; External: 5 steps; on right going up and on left going up Has following equipment at home: None  OCCUPATION: Retired from [employer]  LEISURE: watch TV, read  HAND DOMINANCE: right   PRIOR LEVEL OF FUNCTION: Independent  PATIENT GOALS: Get swelling down in my legs, so I can wear shorts and be more active   OBJECTIVE: Note: Objective measures were completed at Evaluation unless otherwise noted.  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  SENSATION: WFL Denies numbness  POSTURE: WNL  LE ROM: WFL  LE MMT: TBA  LYMPHEDEMA ASSESSMENTS:   SURGERY TYPE/DATE: Non-Ca related- NA  INFECTIONS: Denies hx cellulitis  WOUNDS: Denies   BLE COMPARATIVE LIMB VOLUMETRICS INITIAL 10/28/23  LANDMARK RIGHT   R LEG (A-D) 3158 ml  R THIGH (E-G) ml  R FULL LIMB (A-G) ml  Limb Volume differential (LVD)  %  Volume change since initial %  Volume change overall V  (Blank rows = not tested)  LANDMARK LEFT    L LEG (A-D) 2946.1  L THIGH (E-G) ml  L FULL LIMB (A-G) ml  Limb Volume differential (LVD)   6.7 %, R>L  Volume change since initial %  Volume change overall %  (Blank rows = not tested)    Mild, Stage  II, Bilateral Lower Extremity Lymphedema 2/2 CVI and Obesity  Skin   Description Hyper-Keratosis Peau d'  Orange Shiny Tight Fibrotic/ Indurated Fatty Doughy Spongy/ boggy      x mild   x   Skin dry Flaky WNL Macerated   mildly x     Color Redness Varicosities Blanching Hemosiderin Stain Mottled   x x x  x    Odor Malodorous Yeast Fungal infection  WNL      x   Temperature Warm Cool wnl    x     Pitting Edema   1+ 2+ 3+ 4+ Non-pitting   x         Girth Symmetrical Asymmetrical                   Distribution    x toes to groin    Stemmer Sign Positive Negative   +    Lymphorrhea History Of:  Present Absent     x    Wounds History Of Present Absent Venous Arterial Pressure Sheer     x        Signs of Infection Redness Warmth Erythema Acute Swelling Drainage Borders                    Sensation Light Touch Deep pressure Hypersensitivity   Present Impaired Present Impaired Absent Impaired   x x  x  x     Nails WNL   Fungus nail dystrophy     x   Hair Growth Symmetrical Asymmetrical       Skin Creases Base of toes  Ankles   Base of Fingers knees       Abdominal pannus Thigh Lobules  Face/neck   x x           LYMPHEDEMA LIFE IMPACT SCALE (LLIS):32.35% (The extent to which LE-related problems impacted your life last week.)  TREATMENT DATE: 09/28/23 Initial comparative limb volumetrics RLE compression bandaging  PATIENT EDUCATION:  Continued Pt/ CG edu for lymphedema self care home program throughout session. Topics include outcome of comparative limb volumetrics- starting limb volume differentials (LVDs), technology and gradient techniques used for short stretch, multilayer compression wrapping, simple self-MLD, therapeutic lymphatic pumping exercises, skin/nail care, LE precautions, compression garment recommendations and specifications, wear and care schedule and compression garment donning / doffing w assistive devices. Discussed progress towards all OT goals since commencing CDT. Discussed detrimental impact of  obesity on lower and upper extremity lymphedema over time. Reviewed OT goals for lymphedema care with Pt and discussed progress to date.  All questions answered to the Pt's satisfaction. Good return. Person educated: Patient  Education method: Explanation, Demonstration, and Handouts Education comprehension: verbalized understanding, returned demonstration, verbal cues required, and needs further education    LYMPHEDEMA SELF-CARE HOME PROGRAM: BLE lymphatic pumping there ex using- 1 sets of 10 reps, each exercise in order-  1-2 x daily, bilaterally Simple self MLD 1 x daily Daily skin care to increase hydration, skin mobility and decrease infection risk- can be done during MLD During Intensive Phase of CDT-Compression wraps 23/7 until garment fitting complete  Appropriate daytime compression garments full time. Consider HOS garments/ devices to reduce fibrosis formation and LE progression  Custom-made gradient compression garments and hours-of-sleep devices are medically necessary because they are uniquely sized and shaped to fit the exact dimensions of the affected extremities and to provide accurate and consistent gradient compression and containment, essential  for optimally managing chronic, progressive lymphedema. The convoluted HOS devices are medically necessary to facilitate increased lymphatic circulation and limit fibrosis formation when sleeping. Multiple custom compression garments are needed for optimal hygiene to limit infection risk. Custom compression garments should be replaced q 3-6 months When worn consistently for optimal lymphedema self-management over time.  ASSESSMENT:  CLINICAL IMPRESSION: Emphasis of visit on Pt edu for multilayer compression wrapping. Pt anxious throughout session and needed frequent redirection to stay on task.  By end of session Pt was able to apply wraps with Max assist. Cont as per POC.   OBJECTIVE IMPAIRMENTS: Abnormal gait, decreased endurance,  decreased knowledge of condition, decreased knowledge of use of DME, decreased mobility, difficulty walking, decreased ROM, decreased strength, increased edema, impaired flexibility, impaired sensation, pain, and chronic leg swelling and associated skin changes.   ACTIVITY LIMITATIONS: Basic and instrumental ADLs ( reaching feet to bathe and groom nails, inspect skin and perform LB skin care, fitting LB clothing and preferred street shoes, meal prep, cleaning, driving, shopping and yard work) Functional ambulation and mobility (carrying, lifting, bending, sitting, standing, squatting, and car transfers) Leisure pursuits (walking for exercise), productive activities (caring for others), social participation (community activities) , body image  PERSONAL FACTORS: Age, Past/current experiences, Time since onset of injury/illness/exacerbation, and 3+ comorbidities: varicose veins, OA, chronic pain are also affecting patient's functional outcome.   REHAB POTENTIAL: Good  EVALUATION COMPLEXITY: Moderate   GOALS: Goals reviewed with patient? Yes  GOALS: Goals reviewed with patient? Yes   SHORT TERM GOALS: Target date: 4th OT Rx visit    Pt will demonstrate understanding of lymphedema precautions and prevention strategies with modified independence using a printed reference to identify at least 5 precautions and discussing how s/he may implement them into daily life to reduce risk of progression with modified assistance Baseline: max a Goal status: INITIAL   2.  With modified independence (extra time) Pt will be able to apply multilayer,  knee length, compression wraps using gradient techniques to decrease limb volume, to limit infection risk, and to limit lymphedema progression.   Baseline: Dependent Goal status: INITIAL   LONG TERM GOALS: Target date: 12/27/23  (12 WEEKS)     Given this patient's Intake score of 32.35 % on the Lymphedema Life Impact Scale (LLIS), patient will experience a  reduction of at least 5% in her perceived level of functional impairment resulting from lymphedema to improve functional performance and quality of life (QOL).Baseline: tbd Baseline: max a Goal status: INITIAL   2.  With modified independence (extra time and assistive devices) Pt will be able to don and doff appropriate compression garments and/or devices to control BLE lymphedema and to limit progression.  Baseline: Dependent Goal status: INITIAL   3. Pt will achieve at least a 10% volume reductions bilaterally below the knees to return limb to more typical size and shape, to limit infection risk and LE progression, to decrease pain, to improve function, and to improve body image and QOL. Baseline: Dependent Goal status: INITIAL   4. Pt will achieve and sustain at least 85%  compliance with all LE self-care home program components throughout CDT, including modified simple self-MLD, daily skin care and inspection, lymphatic pumping the ex and appropriate compression to limit lymphedema progression and to limit further functional decline. Baseline: Dependent Goal status: INITIAL    PLAN:  OT FREQUENCY: 2x/week and PRN  OT DURATION: 12 weeks and PRN  PLANNED INTERVENTIONS: 97110-Therapeutic exercises, 97530- Therapeutic activity, 97535- Self Care, [id number]- Manual therapy, Patient/Family education, Manual lymph drainage, Compression bandaging, and skin care simultaneous w MLD, fit w compression garments  PLAN FOR NEXT SESSION:  BLE comparative limb volumetrics Knee length wraps to one l at a time only Pt edu   Arnold Bicker, MS, OTR/L, CLT-LANA 11/10/23 2:23 PM

## 2023-11-11 ENCOUNTER — Telehealth: Payer: Self-pay

## 2023-11-11 ENCOUNTER — Encounter: Admitting: Occupational Therapy

## 2023-11-11 NOTE — Telephone Encounter (Signed)
 Attempted to return pt's call after he left message about scheduling a procedure on vein line.

## 2023-11-16 ENCOUNTER — Ambulatory Visit: Admitting: Occupational Therapy

## 2023-11-16 DIAGNOSIS — I890 Lymphedema, not elsewhere classified: Secondary | ICD-10-CM | POA: Diagnosis not present

## 2023-11-16 NOTE — Therapy (Signed)
 OUTPATIENT OCCUPATIONAL THERAPY  EVALUATION  LOWER EXTREMITY LYMPHEDEMA   Patient Name: Devin Hale MRN: [id number] DOB:[birth year], 73 y.o., male Today's Date: 11/16/2023  END OF SESSION:   OT End of Session - 11/16/23 1440     Visit Number 4    Number of Visits 36    Date for OT Re-Evaluation 12/27/23    OT Start Time 0105    OT Stop Time 0210    OT Time Calculation (min) 65 min    Activity Tolerance Patient tolerated treatment well;No increased pain    Behavior During Therapy WFL for tasks assessed/performed               Past Medical History:  Diagnosis Date   Complete heart block (HCC)    Hepatitis    Hepatitis C    Pacemaker    Past Surgical History:  Procedure Laterality Date   COLONOSCOPY WITH PROPOFOL  N/A 05/27/2020   Procedure: COLONOSCOPY WITH PROPOFOL ;  Surgeon: Marnee Sink, MD;  Location: [facility];  Service: Endoscopy;  Laterality: N/A;   ESOPHAGOGASTRODUODENOSCOPY (EGD) WITH PROPOFOL  N/A 05/27/2020   Procedure: ESOPHAGOGASTRODUODENOSCOPY (EGD) WITH PROPOFOL ;  Surgeon: Marnee Sink, MD;  Location: [facility];  Service: Endoscopy;  Laterality: N/A;   ESOPHAGOGASTRODUODENOSCOPY (EGD) WITH PROPOFOL  N/A 10/07/2022   Procedure: ESOPHAGOGASTRODUODENOSCOPY (EGD) WITH PROPOFOL ;  Surgeon: Marnee Sink, MD;  Location: [facility];  Service: Endoscopy;  Laterality: N/A;  9:30 ARRIVAL TIME DUE TO TRANSPORTATION   IR IVC FILTER RETRIEVAL / S&I /IMG GUID/MOD SED  08/30/2022   IR RADIOLOGIST EVAL & MGMT  08/18/2022   KNEE ARTHROSCOPY Left 1982   LIVER BIOPSY     NASAL SINUS SURGERY  2019   TEMPORARY PACEMAKER N/A 08/13/2019   Procedure: TEMPORARY PACEMAKER;  Surgeon: Wenona Hamilton, MD;  Location: [facility];  Service: Cardiovascular;  Laterality: N/A;   TONSILLECTOMY     Patient Active Problem List   Diagnosis Date Noted   Varicose veins of leg with swelling, bilateral 09/13/2023   Lymphedema 09/13/2023   Varicose veins of leg with pain,  right 11/01/2022   Cirrhosis of liver without ascites (HCC)    Colon cancer screening    Atrial fibrillation (HCC) 05/22/2020   Cardiac pacemaker in situ 04/03/2020   Hepatocellular carcinoma (HCC) 04/03/2020   Other acute postprocedural pain 04/03/2020   Myopathy 03/28/2020   Chronic pain of both knees 03/24/2020   Chronic pain syndrome 03/24/2020   Stimulation of muscle pocket due to and not concurrent with implantation of cardiac pacemaker 01/06/2020   Pericardial effusion 10/04/2019   Chest pain with high risk for cardiac etiology 09/25/2019   AV block, 3rd degree (HCC) 08/13/2019   Complete heart block by electrocardiogram (HCC) 08/13/2019   Chronic rhinitis 01/30/2019   Nasal polyps 01/30/2019   Generalized osteoarthritis 01/22/2019   Age related osteoporosis 01/15/2019   Closed compression fracture of third lumbar vertebra (HCC) 01/15/2019   Chronic venous insufficiency 08/13/2018   Hepatitis C 07/28/2018   Swelling of limb 07/28/2018   Portal hypertension (HCC) 07/19/2018   Myelopathy (HCC) 06/12/2018   Osteoarthritis of knee, unspecified 05/16/2018   Lumbar facet arthropathy 04/30/2018   Long term current use of opiate analgesic 04/30/2018   Cervical spinal stenosis 02/15/2018   Neck pain 02/15/2018   Muscular weakness 02/03/2018   Myalgia 02/03/2018   Bilateral primary osteoarthritis of knee 11/23/2017   Patellar instability of both knees 11/23/2017   Bilateral edema of lower extremity 11/10/2017   Positive ANA (antinuclear antibody)  11/10/2017   Polyarthralgia 11/10/2017   Anxiety 10/25/2017   Other specified abnormal findings of blood chemistry 10/25/2017    PCP: Alexa Andrews, MD  REFERRING PROVIDER: Sharla Davis, NP  REFERRING DIAG: I89.0  THERAPY DIAG:  Lymphedema, not elsewhere classified  Rationale for Evaluation and Treatment: Rehabilitation  ONSET DATE: ~2018; gradual, insidious onset .  SUBJECTIVE:                                                                                                                                                                                           SUBJECTIVE STATEMENT: Devin Hale presents to OT for lymphedema treatment to RLE. Pt denies LE-related leg pain. Pt presents to clinic wearing multilayer compression wraps on R Leg as directed.   PERTINENT HISTORY:   Diagnosis Date Noted   Varicose veins of leg with swelling, bilateral 09/13/2023   Lymphedema 09/13/2023   Varicose veins of leg with pain, right 11/01/2022   Cirrhosis of liver without ascites (HCC)    Colon cancer screening    Atrial fibrillation (HCC) 05/22/2020   Cardiac pacemaker in situ 04/03/2020   Hepatocellular carcinoma (HCC) 04/03/2020   Other acute postprocedural pain 04/03/2020   Myopathy 03/28/2020   Chronic pain of both knees 03/24/2020   Chronic pain syndrome 03/24/2020   Stimulation of muscle pocket due to and not concurrent with implantation of cardiac pacemaker 01/06/2020   Pericardial effusion 10/04/2019   Chest pain with high risk for cardiac etiology 09/25/2019   AV block, 3rd degree (HCC) 08/13/2019   Complete heart block by electrocardiogram (HCC) 08/13/2019   Chronic rhinitis 01/30/2019   Nasal polyps 01/30/2019   Generalized osteoarthritis 01/22/2019   Age related osteoporosis 01/15/2019   Closed compression fracture of third lumbar vertebra (HCC) 01/15/2019   Chronic venous insufficiency 08/13/2018   Hepatitis C 07/28/2018   Swelling of limb 07/28/2018   Portal hypertension (HCC) 07/19/2018   Myelopathy (HCC) 06/12/2018   Osteoarthritis of knee, unspecified 05/16/2018   Lumbar facet arthropathy 04/30/2018   Long term current use of opiate analgesic 04/30/2018   Cervical spinal stenosis 02/15/2018   Neck pain 02/15/2018   Muscular weakness 02/03/2018   Myalgia 02/03/2018   Bilateral primary osteoarthritis of knee 11/23/2017   Patellar instability of both knees 11/23/2017   Bilateral edema of  lower extremity 11/10/2017   Positive ANA (antinuclear antibody) 11/10/2017   Polyarthralgia 11/10/2017   Anxiety 10/25/2017   Other specified abnormal findings of blood chemistry 10/25/2017    PAIN: tightness and irritation of skin  PRECAUTIONS: ICD/Pacemaker and Other: Lymphedema Precautions   WEIGHT BEARING RESTRICTIONS: No  FALLS:  Has patient fallen  in last 6 months? No  LIVING ENVIRONMENT: Lives with: lives alone Lives in: House/apartment Stairs: No; External: 5 steps; on right going up and on left going up Has following equipment at home: None  OCCUPATION: Retired from [employer]  LEISURE: watch TV, read  HAND DOMINANCE: right   PRIOR LEVEL OF FUNCTION: Independent  PATIENT GOALS: Get swelling down in my legs, so I can wear shorts and be more active   OBJECTIVE: Note: Objective measures were completed at Evaluation unless otherwise noted.  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  SENSATION: WFL Denies numbness  POSTURE: WNL  LE ROM: WFL  LE MMT: TBA  LYMPHEDEMA ASSESSMENTS:   SURGERY TYPE/DATE: Non-Ca related- NA  INFECTIONS: Denies hx cellulitis  WOUNDS: Denies   BLE COMPARATIVE LIMB VOLUMETRICS INITIAL 10/28/23  LANDMARK RIGHT   R LEG (A-D) 3158 ml  R THIGH (E-G) ml  R FULL LIMB (A-G) ml  Limb Volume differential (LVD)  %  Volume change since initial %  Volume change overall V  (Blank rows = not tested)  LANDMARK LEFT    L LEG (A-D) 2946.1  L THIGH (E-G) ml  L FULL LIMB (A-G) ml  Limb Volume differential (LVD)   6.7 %, R>L  Volume change since initial %  Volume change overall %  (Blank rows = not tested)    Mild, Stage  II, Bilateral Lower Extremity Lymphedema 2/2 CVI and Obesity  Skin  Description Hyper-Keratosis Peau d'  Orange Shiny Tight Fibrotic/ Indurated Fatty Doughy Spongy/ boggy      x mild   x    Skin dry Flaky WNL Macerated   mildly x     Color Redness Varicosities Blanching Hemosiderin Stain Mottled   x x x  x    Odor Malodorous Yeast Fungal infection  WNL      x   Temperature Warm Cool wnl    x     Pitting Edema   1+ 2+ 3+ 4+ Non-pitting   x         Girth Symmetrical Asymmetrical                   Distribution    x toes to groin    Stemmer Sign Positive Negative   +    Lymphorrhea History Of:  Present Absent     x    Wounds History Of Present Absent Venous Arterial Pressure Sheer     x        Signs of Infection Redness Warmth Erythema Acute Swelling Drainage Borders                    Sensation Light Touch Deep pressure Hypersensitivity   Present Impaired Present Impaired Absent Impaired   x x  x  x     Nails WNL   Fungus nail dystrophy     x   Hair Growth Symmetrical Asymmetrical       Skin Creases Base of toes  Ankles   Base of Fingers knees       Abdominal pannus Thigh Lobules  Face/neck   x x           LYMPHEDEMA LIFE IMPACT SCALE (LLIS):32.35% (The extent to which LE-related problems impacted your life last week.)  TREATMENT DATE: 09/28/23 Pt edu for LE self care MLD to RLE/RLQ RLE compression bandaging  PATIENT EDUCATION:  Continued Pt/ CG edu for lymphedema self care home program throughout session. Topics include outcome of comparative limb volumetrics- starting limb volume differentials (LVDs), technology and gradient techniques used for short stretch, multilayer compression wrapping, simple self-MLD, therapeutic lymphatic pumping exercises, skin/nail care, LE precautions, compression garment recommendations and specifications, wear and care schedule and compression garment donning / doffing w assistive devices. Discussed progress towards all OT goals since commencing CDT. Discussed detrimental impact of obesity on lower and upper extremity lymphedema over time. Reviewed OT goals for lymphedema care with Pt and discussed  progress to date.  All questions answered to the Pt's satisfaction. Good return. Person educated: Patient  Education method: Explanation, Demonstration, and Handouts Education comprehension: verbalized understanding, returned demonstration, verbal cues required, and needs further education    LYMPHEDEMA SELF-CARE HOME PROGRAM: BLE lymphatic pumping there ex using- 1 sets of 10 reps, each exercise in order-  1-2 x daily, bilaterally Simple self MLD 1 x daily Daily skin care to increase hydration, skin mobility and decrease infection risk- can be done during MLD During Intensive Phase of CDT-Compression wraps 23/7 until garment fitting complete  Appropriate daytime compression garments full time. Consider HOS garments/ devices to reduce fibrosis formation and LE progression  Custom-made gradient compression garments and hours-of-sleep devices are medically necessary because they are uniquely sized and shaped to fit the exact dimensions of the affected extremities and to provide accurate and consistent gradient compression and containment, essential  for optimally managing chronic, progressive lymphedema. The convoluted HOS devices are medically necessary to facilitate increased lymphatic circulation and limit fibrosis formation when sleeping. Multiple custom compression garments are needed for optimal hygiene to limit infection risk. Custom compression garments should be replaced q 3-6 months When worn consistently for optimal lymphedema self-management over time.  ASSESSMENT:  CLINICAL IMPRESSION: Emphasis of visit on Pt edu for simple self-MLD and multilayer compression wrapping. Reviewed MLD during activity, explaining lymphatic map and stroke sequence techniques. Commenced RLE/RLQ MLD utilizing short neck sequence, deep abdominal lymphatics, functional inguinal LN, thigh, knee and foot, Pt tolerated MLD without increased pain. He verbalized understanding of MLD process. Reapplied wraps as  established. Cont as per POC.   OBJECTIVE IMPAIRMENTS: Abnormal gait, decreased endurance, decreased knowledge of condition, decreased knowledge of use of DME, decreased mobility, difficulty walking, decreased ROM, decreased strength, increased edema, impaired flexibility, impaired sensation, pain, and chronic leg swelling and associated skin changes.   ACTIVITY LIMITATIONS: Basic and instrumental ADLs ( reaching feet to bathe and groom nails, inspect skin and perform LB skin care, fitting LB clothing and preferred street shoes, meal prep, cleaning, driving, shopping and yard work) Functional ambulation and mobility (carrying, lifting, bending, sitting, standing, squatting, and car transfers) Leisure pursuits (walking for exercise), productive activities (caring for others), social participation (community activities) , body image  PERSONAL FACTORS: Age, Past/current experiences, Time since onset of injury/illness/exacerbation, and 3+ comorbidities: varicose veins, OA, chronic pain are also affecting patient's functional outcome.   REHAB POTENTIAL: Good  EVALUATION COMPLEXITY: Moderate   GOALS: Goals reviewed with patient? Yes  GOALS: Goals reviewed with patient? Yes   SHORT TERM GOALS: Target date: 4th OT Rx visit    Pt will demonstrate understanding of lymphedema precautions and prevention strategies with modified independence using a printed reference to identify at least 5 precautions and discussing how s/he may implement them into daily life to reduce risk of progression with modified assistance Baseline: max a Goal status: INITIAL   2.  With modified independence (extra time)  Pt will be able to apply multilayer, knee length, compression wraps using gradient techniques to decrease limb volume, to limit infection risk, and to limit lymphedema progression.   Baseline: Dependent Goal status: INITIAL   LONG TERM GOALS: Target date: 12/27/23  (12 WEEKS)     Given this patient's Intake  score of 32.35 % on the Lymphedema Life Impact Scale (LLIS), patient will experience a reduction of at least 5% in her perceived level of functional impairment resulting from lymphedema to improve functional performance and quality of life (QOL).Baseline: tbd Baseline: max a Goal status: INITIAL   2.  With modified independence (extra time and assistive devices) Pt will be able to don and doff appropriate compression garments and/or devices to control BLE lymphedema and to limit progression.  Baseline: Dependent Goal status: INITIAL   3. Pt will achieve at least a 10% volume reductions bilaterally below the knees to return limb to more typical size and shape, to limit infection risk and LE progression, to decrease pain, to improve function, and to improve body image and QOL. Baseline: Dependent Goal status: INITIAL   4. Pt will achieve and sustain at least 85%  compliance with all LE self-care home program components throughout CDT, including modified simple self-MLD, daily skin care and inspection, lymphatic pumping the ex and appropriate compression to limit lymphedema progression and to limit further functional decline. Baseline: Dependent Goal status: INITIAL    PLAN:  OT FREQUENCY: 2x/week and PRN  OT DURATION: 12 weeks and PRN  PLANNED INTERVENTIONS: 97110-Therapeutic exercises, 97530- Therapeutic activity, 97535- Self Care, 14782- Manual therapy, Patient/Family education, Manual lymph drainage, Compression bandaging, and skin care simultaneous w MLD, fit w compression garments  PLAN FOR NEXT SESSION:  BLE comparative limb volumetrics Knee length wraps to one l at a time only Pt edu   Arnold Bicker, MS, OTR/L, CLT-LANA 11/16/23 3:02 PM

## 2023-11-19 LAB — LAB REPORT - SCANNED
A1c: 5.6
EGFR: 93

## 2023-11-22 ENCOUNTER — Telehealth: Payer: Self-pay | Admitting: *Deleted

## 2023-11-22 NOTE — Telephone Encounter (Signed)
 Returning Mr. Devin Hale telephone voice message from yesterday regarding pursing a laser ablation of his left leg.  Mr. Devin Hale has not been seen in this practice.  He has seen by Devin Helena MD at [facility] and [name] at [facility] regarding left leg ablation.  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 Discussed with Mr. Devin Hale that he would need to see one of our physicians that perform laser ablation procedures and get a venous reflux exam of his left leg if his last venous reflux study was > 84 months old.  In formed him that our practice was booking 3 months out for appointments with our physicians that perform laser ablations.  Mr. Devin Hale verbalized understanding and states he will continue his search for providers as he desires to get in sooner than our practice can accommodate.

## 2023-11-23 ENCOUNTER — Ambulatory Visit: Attending: Nurse Practitioner | Admitting: Occupational Therapy

## 2023-11-23 DIAGNOSIS — I890 Lymphedema, not elsewhere classified: Secondary | ICD-10-CM

## 2023-11-23 NOTE — Therapy (Signed)
 OUTPATIENT OCCUPATIONAL THERAPY  TREATMENT NOTE  LOWER EXTREMITY LYMPHEDEMA   Patient Name: Devin Hale MRN: [id number] DOB:[birth year], 73 y.o., male Today's Date: 11/23/2023  END OF SESSION:   OT End of Session - 11/23/23 1310     Visit Number 5    Number of Visits 36    Date for OT Re-Evaluation 12/27/23    OT Start Time 0105    OT Stop Time 0210    OT Time Calculation (min) 65 min    Activity Tolerance Patient tolerated treatment well;No increased pain    Behavior During Therapy WFL for tasks assessed/performed               Past Medical History:  Diagnosis Date   Complete heart block (HCC)    Hepatitis    Hepatitis C    Pacemaker    Past Surgical History:  Procedure Laterality Date   COLONOSCOPY WITH PROPOFOL  N/A 05/27/2020   Procedure: COLONOSCOPY WITH PROPOFOL ;  Surgeon: Marnee Sink, MD;  Location: [facility];  Service: Endoscopy;  Laterality: N/A;   ESOPHAGOGASTRODUODENOSCOPY (EGD) WITH PROPOFOL  N/A 05/27/2020   Procedure: ESOPHAGOGASTRODUODENOSCOPY (EGD) WITH PROPOFOL ;  Surgeon: Marnee Sink, MD;  Location: [facility];  Service: Endoscopy;  Laterality: N/A;   ESOPHAGOGASTRODUODENOSCOPY (EGD) WITH PROPOFOL  N/A 10/07/2022   Procedure: ESOPHAGOGASTRODUODENOSCOPY (EGD) WITH PROPOFOL ;  Surgeon: Marnee Sink, MD;  Location: [facility];  Service: Endoscopy;  Laterality: N/A;  9:30 ARRIVAL TIME DUE TO TRANSPORTATION   IR IVC FILTER RETRIEVAL / S&I /IMG GUID/MOD SED  08/30/2022   IR RADIOLOGIST EVAL & MGMT  08/18/2022   KNEE ARTHROSCOPY Left 1982   LIVER BIOPSY     NASAL SINUS SURGERY  2019   TEMPORARY PACEMAKER N/A 08/13/2019   Procedure: TEMPORARY PACEMAKER;  Surgeon: Wenona Hamilton, MD;  Location: [facility];  Service: Cardiovascular;  Laterality: N/A;   TONSILLECTOMY     Patient Active Problem List   Diagnosis Date Noted   Varicose veins of leg with swelling, bilateral 09/13/2023   Lymphedema 09/13/2023   Varicose veins of leg with pain,  right 11/01/2022   Cirrhosis of liver without ascites (HCC)    Colon cancer screening    Atrial fibrillation (HCC) 05/22/2020   Cardiac pacemaker in situ 04/03/2020   Hepatocellular carcinoma (HCC) 04/03/2020   Other acute postprocedural pain 04/03/2020   Myopathy 03/28/2020   Chronic pain of both knees 03/24/2020   Chronic pain syndrome 03/24/2020   Stimulation of muscle pocket due to and not concurrent with implantation of cardiac pacemaker 01/06/2020   Pericardial effusion 10/04/2019   Chest pain with high risk for cardiac etiology 09/25/2019   AV block, 3rd degree (HCC) 08/13/2019   Complete heart block by electrocardiogram (HCC) 08/13/2019   Chronic rhinitis 01/30/2019   Nasal polyps 01/30/2019   Generalized osteoarthritis 01/22/2019   Age related osteoporosis 01/15/2019   Closed compression fracture of third lumbar vertebra (HCC) 01/15/2019   Chronic venous insufficiency 08/13/2018   Hepatitis C 07/28/2018   Swelling of limb 07/28/2018   Portal hypertension (HCC) 07/19/2018   Myelopathy (HCC) 06/12/2018   Osteoarthritis of knee, unspecified 05/16/2018   Lumbar facet arthropathy 04/30/2018   Long term current use of opiate analgesic 04/30/2018   Cervical spinal stenosis 02/15/2018   Neck pain 02/15/2018   Muscular weakness 02/03/2018   Myalgia 02/03/2018   Bilateral primary osteoarthritis of knee 11/23/2017   Patellar instability of both knees 11/23/2017   Bilateral edema of lower extremity 11/10/2017   Positive ANA (antinuclear  antibody) 11/10/2017   Polyarthralgia 11/10/2017   Anxiety 10/25/2017   Other specified abnormal findings of blood chemistry 10/25/2017    PCP: Alexa Andrews, MD  REFERRING PROVIDER: Sharla Davis, NP  REFERRING DIAG: I89.0  THERAPY DIAG:  Lymphedema, not elsewhere classified  Rationale for Evaluation and Treatment: Rehabilitation  ONSET DATE: ~2018; gradual, insidious onset .  SUBJECTIVE:                                                                                                                                                                                           SUBJECTIVE STATEMENT: Devin Hale presents to OT for lymphedema treatment to RLE. Pt denies LE-related leg pain. Pt presents to clinic wearing multilayer compression wraps on R Leg as directed. Pt states he feels good about his progress thus far.  PERTINENT HISTORY:   Diagnosis Date Noted   Varicose veins of leg with swelling, bilateral 09/13/2023   Lymphedema 09/13/2023   Varicose veins of leg with pain, right 11/01/2022   Cirrhosis of liver without ascites (HCC)    Colon cancer screening    Atrial fibrillation (HCC) 05/22/2020   Cardiac pacemaker in situ 04/03/2020   Hepatocellular carcinoma (HCC) 04/03/2020   Other acute postprocedural pain 04/03/2020   Myopathy 03/28/2020   Chronic pain of both knees 03/24/2020   Chronic pain syndrome 03/24/2020   Stimulation of muscle pocket due to and not concurrent with implantation of cardiac pacemaker 01/06/2020   Pericardial effusion 10/04/2019   Chest pain with high risk for cardiac etiology 09/25/2019   AV block, 3rd degree (HCC) 08/13/2019   Complete heart block by electrocardiogram (HCC) 08/13/2019   Chronic rhinitis 01/30/2019   Nasal polyps 01/30/2019   Generalized osteoarthritis 01/22/2019   Age related osteoporosis 01/15/2019   Closed compression fracture of third lumbar vertebra (HCC) 01/15/2019   Chronic venous insufficiency 08/13/2018   Hepatitis C 07/28/2018   Swelling of limb 07/28/2018   Portal hypertension (HCC) 07/19/2018   Myelopathy (HCC) 06/12/2018   Osteoarthritis of knee, unspecified 05/16/2018   Lumbar facet arthropathy 04/30/2018   Long term current use of opiate analgesic 04/30/2018   Cervical spinal stenosis 02/15/2018   Neck pain 02/15/2018   Muscular weakness 02/03/2018   Myalgia 02/03/2018   Bilateral primary osteoarthritis of knee 11/23/2017   Patellar  instability of both knees 11/23/2017   Bilateral edema of lower extremity 11/10/2017   Positive ANA (antinuclear antibody) 11/10/2017   Polyarthralgia 11/10/2017   Anxiety 10/25/2017   Other specified abnormal findings of blood chemistry 10/25/2017    PAIN: tightness and irritation of skin  PRECAUTIONS: ICD/Pacemaker and Other: Lymphedema Precautions  WEIGHT BEARING RESTRICTIONS: No  FALLS:  Has patient fallen in last 6 months? No  LIVING ENVIRONMENT: Lives with: lives alone Lives in: House/apartment Stairs: No; External: 5 steps; on right going up and on left going up Has following equipment at home: None  OCCUPATION: Retired from [employer]  LEISURE: watch TV, read  HAND DOMINANCE: right   PRIOR LEVEL OF FUNCTION: Independent  PATIENT GOALS: Get swelling down in my legs, so I can wear shorts and be more active   OBJECTIVE: Note: Objective measures were completed at Evaluation unless otherwise noted.  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  SENSATION: WFL Denies numbness  POSTURE: WNL  LE ROM: WFL  LE MMT: TBA  LYMPHEDEMA ASSESSMENTS:   SURGERY TYPE/DATE: Non-Ca related- NA  INFECTIONS: Denies hx cellulitis  WOUNDS: Denies   BLE COMPARATIVE LIMB VOLUMETRICS INITIAL 10/28/23  LANDMARK RIGHT   R LEG (A-D) 3158 ml  R THIGH (E-G) ml  R FULL LIMB (A-G) ml  Limb Volume differential (LVD)  %  Volume change since initial %  Volume change overall V  (Blank rows = not tested)  LANDMARK LEFT    L LEG (A-D) 2946.1  L THIGH (E-G) ml  L FULL LIMB (A-G) ml  Limb Volume differential (LVD)   6.7 %, R>L  Volume change since initial %  Volume change overall %  (Blank rows = not tested)    Mild, Stage  II, Bilateral Lower Extremity Lymphedema 2/2 CVI and Obesity  Skin  Description Hyper-Keratosis Peau d'  Orange Shiny Tight  Fibrotic/ Indurated Fatty Doughy Spongy/ boggy      x mild   x   Skin dry Flaky WNL Macerated   mildly x     Color Redness Varicosities Blanching Hemosiderin Stain Mottled   x x x  x    Odor Malodorous Yeast Fungal infection  WNL      x   Temperature Warm Cool wnl    x     Pitting Edema   1+ 2+ 3+ 4+ Non-pitting   x         Girth Symmetrical Asymmetrical                   Distribution    x toes to groin    Stemmer Sign Positive Negative   +    Lymphorrhea History Of:  Present Absent     x    Wounds History Of Present Absent Venous Arterial Pressure Sheer     x        Signs of Infection Redness Warmth Erythema Acute Swelling Drainage Borders                    Sensation Light Touch Deep pressure Hypersensitivity   Present Impaired Present Impaired Absent Impaired   x x  x  x     Nails WNL   Fungus nail dystrophy     x   Hair Growth Symmetrical Asymmetrical       Skin Creases Base of toes  Ankles   Base of Fingers knees       Abdominal pannus Thigh Lobules  Face/neck   x x           LYMPHEDEMA LIFE IMPACT SCALE (LLIS):32.35% (The extent to which LE-related problems impacted your life last week.)  TREATMENT DATE: 09/28/23 Pt edu for LE self  care MLD to RLE/RLQ RLE compression bandaging  PATIENT EDUCATION:  Continued Pt/ CG edu for lymphedema self care home program throughout session. Topics include outcome of comparative limb volumetrics- starting limb volume differentials (LVDs), technology and gradient techniques used for short stretch, multilayer compression wrapping, simple self-MLD, therapeutic lymphatic pumping exercises, skin/nail care, LE precautions, compression garment recommendations and specifications, wear and care schedule and compression garment donning / doffing w assistive devices. Discussed progress towards all OT goals since commencing CDT. Discussed detrimental impact of obesity on lower and upper extremity lymphedema over  time. Reviewed OT goals for lymphedema care with Pt and discussed progress to date.  All questions answered to the Pt's satisfaction. Good return. Person educated: Patient  Education method: Explanation, Demonstration, and Handouts Education comprehension: verbalized understanding, returned demonstration, verbal cues required, and needs further education    LYMPHEDEMA SELF-CARE HOME PROGRAM: BLE lymphatic pumping there ex using- 1 sets of 10 reps, each exercise in order-  1-2 x daily, bilaterally Simple self MLD 1 x daily Daily skin care to increase hydration, skin mobility and decrease infection risk- can be done during MLD During Intensive Phase of CDT-Compression wraps 23/7 until garment fitting complete  Appropriate daytime compression garments full time. Consider HOS garments/ devices to reduce fibrosis formation and LE progression   ASSESSMENT: CLINICAL IMPRESSION: Emphasis of visit on Pt edu for multilayer compression wrapping and custom compression garment properties and process for obtaining them from a DME vendor. Reviewed wrapping yet again as Pt continues to make a few errors with wraps. We agree to inspect existing compression garments at next session for consideration before recommending other , more containing garments. Continued RLE/RLQ MLD utilizing short neck sequence, deep abdominal lymphatics, functional inguinal LN, thigh, knee and foot, Pt tolerated MLD without increased pain. He verbalized understanding of MLD process. Reapplied wraps as established. Cont as per POC.   OBJECTIVE IMPAIRMENTS: Abnormal gait, decreased endurance, decreased knowledge of condition, decreased knowledge of use of DME, decreased mobility, difficulty walking, decreased ROM, decreased strength, increased edema, impaired flexibility, impaired sensation, pain, and chronic leg swelling and associated skin changes.   ACTIVITY LIMITATIONS: Basic and instrumental ADLs ( reaching feet to bathe and groom  nails, inspect skin and perform LB skin care, fitting LB clothing and preferred street shoes, meal prep, cleaning, driving, shopping and yard work) Functional ambulation and mobility (carrying, lifting, bending, sitting, standing, squatting, and car transfers) Leisure pursuits (walking for exercise), productive activities (caring for others), social participation (community activities) , body image  PERSONAL FACTORS: Age, Past/current experiences, Time since onset of injury/illness/exacerbation, and 3+ comorbidities: varicose veins, OA, chronic pain are also affecting patient's functional outcome.   REHAB POTENTIAL: Good  EVALUATION COMPLEXITY: Moderate   GOALS: Goals reviewed with patient? Yes  GOALS: Goals reviewed with patient? Yes   SHORT TERM GOALS: Target date: 4th OT Rx visit    Pt will demonstrate understanding of lymphedema precautions and prevention strategies with modified independence using a printed reference to identify at least 5 precautions and discussing how s/he may implement them into daily life to reduce risk of progression with modified assistance Baseline: max a Goal status: INITIAL   2.  With modified independence (extra time) Pt will be able to apply multilayer, knee length, compression wraps using gradient techniques to decrease limb volume, to limit infection risk, and to limit lymphedema progression.   Baseline: Dependent Goal status: INITIAL   LONG TERM GOALS: Target date: 12/27/23  (12 WEEKS)     Given  this patient's Intake score of 32.35 % on the Lymphedema Life Impact Scale (LLIS), patient will experience a reduction of at least 5% in her perceived level of functional impairment resulting from lymphedema to improve functional performance and quality of life (QOL).Baseline: tbd Baseline: max a Goal status: INITIAL   2.  With modified independence (extra time and assistive devices) Pt will be able to don and doff appropriate compression garments and/or  devices to control BLE lymphedema and to limit progression.  Baseline: Dependent Goal status: INITIAL   3. Pt will achieve at least a 10% volume reductions bilaterally below the knees to return limb to more typical size and shape, to limit infection risk and LE progression, to decrease pain, to improve function, and to improve body image and QOL. Baseline: Dependent Goal status: INITIAL   4. Pt will achieve and sustain at least 85%  compliance with all LE self-care home program components throughout CDT, including modified simple self-MLD, daily skin care and inspection, lymphatic pumping the ex and appropriate compression to limit lymphedema progression and to limit further functional decline. Baseline: Dependent Goal status: INITIAL    PLAN:  OT FREQUENCY: 2x/week and PRN  OT DURATION: 12 weeks and PRN  PLANNED INTERVENTIONS: 97110-Therapeutic exercises, 97530- Therapeutic activity, 97535- Self Care, [id number]- Manual therapy, Patient/Family education, Manual lymph drainage, Compression bandaging, and skin care simultaneous w MLD, fit w compression garments  PLAN FOR NEXT SESSION:  BLE comparative limb volumetrics Knee length wraps to one l at a time only Pt edu   Arnold Bicker, MS, OTR/L, CLT-LANA 11/23/23 2:13 PM

## 2023-11-24 ENCOUNTER — Telehealth: Payer: Self-pay | Admitting: Internal Medicine

## 2023-11-24 ENCOUNTER — Encounter: Payer: 59 | Admitting: Internal Medicine

## 2023-11-24 ENCOUNTER — Encounter: Payer: Self-pay | Admitting: Internal Medicine

## 2023-11-24 NOTE — Telephone Encounter (Signed)
 Pt called and left message to inform pt that his message was forwarded to [facility] team for advice

## 2023-11-24 NOTE — Telephone Encounter (Signed)
 Unable to get pt or staff on the phone at [facility] - see pt advice note on same day

## 2023-11-24 NOTE — Telephone Encounter (Signed)
 Calling to say they are sending over labs that patients what's dr Devin Hale to take a look at. Patient has some concerns about the meds the dr want to put the patient on. Please advise

## 2023-11-24 NOTE — Telephone Encounter (Signed)
 Pt returning call

## 2023-11-25 ENCOUNTER — Ambulatory Visit: Admitting: Occupational Therapy

## 2023-11-25 DIAGNOSIS — I890 Lymphedema, not elsewhere classified: Secondary | ICD-10-CM

## 2023-11-25 NOTE — Therapy (Signed)
 OUTPATIENT OCCUPATIONAL THERAPY  TREATMENT NOTE  LOWER EXTREMITY LYMPHEDEMA   Patient Name: Devin Hale MRN: [id number] DOB:[birth year], 73 y.o., male Today's Date: 11/25/2023  END OF SESSION:   OT End of Session - 11/25/23 0901     Visit Number 6    Number of Visits 36    Date for OT Re-Evaluation 12/27/23    OT Start Time 0900    OT Stop Time 0957    OT Time Calculation (min) 57 min    Activity Tolerance Patient tolerated treatment well;No increased pain    Behavior During Therapy WFL for tasks assessed/performed               Past Medical History:  Diagnosis Date   Complete heart block (HCC)    Hepatitis    Hepatitis C    Pacemaker    Past Surgical History:  Procedure Laterality Date   COLONOSCOPY WITH PROPOFOL  N/A 05/27/2020   Procedure: COLONOSCOPY WITH PROPOFOL ;  Surgeon: Marnee Sink, MD;  Location: [facility];  Service: Endoscopy;  Laterality: N/A;   ESOPHAGOGASTRODUODENOSCOPY (EGD) WITH PROPOFOL  N/A 05/27/2020   Procedure: ESOPHAGOGASTRODUODENOSCOPY (EGD) WITH PROPOFOL ;  Surgeon: Marnee Sink, MD;  Location: [facility];  Service: Endoscopy;  Laterality: N/A;   ESOPHAGOGASTRODUODENOSCOPY (EGD) WITH PROPOFOL  N/A 10/07/2022   Procedure: ESOPHAGOGASTRODUODENOSCOPY (EGD) WITH PROPOFOL ;  Surgeon: Marnee Sink, MD;  Location: [facility];  Service: Endoscopy;  Laterality: N/A;  9:30 ARRIVAL TIME DUE TO TRANSPORTATION   IR IVC FILTER RETRIEVAL / S&I /IMG GUID/MOD SED  08/30/2022   IR RADIOLOGIST EVAL & MGMT  08/18/2022   KNEE ARTHROSCOPY Left 1982   LIVER BIOPSY     NASAL SINUS SURGERY  2019   TEMPORARY PACEMAKER N/A 08/13/2019   Procedure: TEMPORARY PACEMAKER;  Surgeon: Wenona Hamilton, MD;  Location: [facility];  Service: Cardiovascular;  Laterality: N/A;   TONSILLECTOMY     Patient Active Problem List   Diagnosis Date Noted   Varicose veins of leg with swelling, bilateral 09/13/2023   Lymphedema 09/13/2023   Varicose veins of leg with pain,  right 11/01/2022   Cirrhosis of liver without ascites (HCC)    Colon cancer screening    Atrial fibrillation (HCC) 05/22/2020   Cardiac pacemaker in situ 04/03/2020   Hepatocellular carcinoma (HCC) 04/03/2020   Other acute postprocedural pain 04/03/2020   Myopathy 03/28/2020   Chronic pain of both knees 03/24/2020   Chronic pain syndrome 03/24/2020   Stimulation of muscle pocket due to and not concurrent with implantation of cardiac pacemaker 01/06/2020   Pericardial effusion 10/04/2019   Chest pain with high risk for cardiac etiology 09/25/2019   AV block, 3rd degree (HCC) 08/13/2019   Complete heart block by electrocardiogram (HCC) 08/13/2019   Chronic rhinitis 01/30/2019   Nasal polyps 01/30/2019   Generalized osteoarthritis 01/22/2019   Age related osteoporosis 01/15/2019   Closed compression fracture of third lumbar vertebra (HCC) 01/15/2019   Chronic venous insufficiency 08/13/2018   Hepatitis C 07/28/2018   Swelling of limb 07/28/2018   Portal hypertension (HCC) 07/19/2018   Myelopathy (HCC) 06/12/2018   Osteoarthritis of knee, unspecified 05/16/2018   Lumbar facet arthropathy 04/30/2018   Long term current use of opiate analgesic 04/30/2018   Cervical spinal stenosis 02/15/2018   Neck pain 02/15/2018   Muscular weakness 02/03/2018   Myalgia 02/03/2018   Bilateral primary osteoarthritis of knee 11/23/2017   Patellar instability of both knees 11/23/2017   Bilateral edema of lower extremity 11/10/2017   Positive ANA (antinuclear  antibody) 11/10/2017   Polyarthralgia 11/10/2017   Anxiety 10/25/2017   Other specified abnormal findings of blood chemistry 10/25/2017    PCP: Alexa Andrews, MD  REFERRING PROVIDER: Sharla Davis, NP  REFERRING DIAG: I89.0  THERAPY DIAG:  Lymphedema, not elsewhere classified  Rationale for Evaluation and Treatment: Rehabilitation  ONSET DATE: ~2018; gradual, insidious onset .  SUBJECTIVE:                                                                                                                                                                                           SUBJECTIVE STATEMENT: Devin Hale presents to OT for lymphedema treatment to RLE. Pt denies LE-related leg pain. Pt presents to clinic wearing multilayer compression wraps on R Leg as directed. Pt states he feels good about his progress thus far.  PERTINENT HISTORY:   Diagnosis Date Noted   Varicose veins of leg with swelling, bilateral 09/13/2023   Lymphedema 09/13/2023   Varicose veins of leg with pain, right 11/01/2022   Cirrhosis of liver without ascites (HCC)    Colon cancer screening    Atrial fibrillation (HCC) 05/22/2020   Cardiac pacemaker in situ 04/03/2020   Hepatocellular carcinoma (HCC) 04/03/2020   Other acute postprocedural pain 04/03/2020   Myopathy 03/28/2020   Chronic pain of both knees 03/24/2020   Chronic pain syndrome 03/24/2020   Stimulation of muscle pocket due to and not concurrent with implantation of cardiac pacemaker 01/06/2020   Pericardial effusion 10/04/2019   Chest pain with high risk for cardiac etiology 09/25/2019   AV block, 3rd degree (HCC) 08/13/2019   Complete heart block by electrocardiogram (HCC) 08/13/2019   Chronic rhinitis 01/30/2019   Nasal polyps 01/30/2019   Generalized osteoarthritis 01/22/2019   Age related osteoporosis 01/15/2019   Closed compression fracture of third lumbar vertebra (HCC) 01/15/2019   Chronic venous insufficiency 08/13/2018   Hepatitis C 07/28/2018   Swelling of limb 07/28/2018   Portal hypertension (HCC) 07/19/2018   Myelopathy (HCC) 06/12/2018   Osteoarthritis of knee, unspecified 05/16/2018   Lumbar facet arthropathy 04/30/2018   Long term current use of opiate analgesic 04/30/2018   Cervical spinal stenosis 02/15/2018   Neck pain 02/15/2018   Muscular weakness 02/03/2018   Myalgia 02/03/2018   Bilateral primary osteoarthritis of knee 11/23/2017   Patellar  instability of both knees 11/23/2017   Bilateral edema of lower extremity 11/10/2017   Positive ANA (antinuclear antibody) 11/10/2017   Polyarthralgia 11/10/2017   Anxiety 10/25/2017   Other specified abnormal findings of blood chemistry 10/25/2017    PAIN: tightness and irritation of skin  PRECAUTIONS: ICD/Pacemaker and Other: Lymphedema Precautions  WEIGHT BEARING RESTRICTIONS: No  FALLS:  Has patient fallen in last 6 months? No  LIVING ENVIRONMENT: Lives with: lives alone Lives in: House/apartment Stairs: No; External: 5 steps; on right going up and on left going up Has following equipment at home: None  OCCUPATION: Retired from [employer]  LEISURE: watch TV, read  HAND DOMINANCE: right   PRIOR LEVEL OF FUNCTION: Independent  PATIENT GOALS: Get swelling down in my legs, so I can wear shorts and be more active   OBJECTIVE: Note: Objective measures were completed at Evaluation unless otherwise noted.  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  SENSATION: WFL Denies numbness  POSTURE: WNL  LE ROM: WFL  LE MMT: TBA  LYMPHEDEMA ASSESSMENTS:   SURGERY TYPE/DATE: Non-Ca related- NA  INFECTIONS: Denies hx cellulitis  WOUNDS: Denies   BLE COMPARATIVE LIMB VOLUMETRICS INITIAL 10/28/23  LANDMARK RIGHT   R LEG (A-D) 3158 ml  R THIGH (E-G) ml  R FULL LIMB (A-G) ml  Limb Volume differential (LVD)  %  Volume change since initial %  Volume change overall V  (Blank rows = not tested)  LANDMARK LEFT    L LEG (A-D) 2946.1  L THIGH (E-G) ml  L FULL LIMB (A-G) ml  Limb Volume differential (LVD)   6.7 %, R>L  Volume change since initial %  Volume change overall %  (Blank rows = not tested)    Mild, Stage  II, Bilateral Lower Extremity Lymphedema 2/2 CVI and Obesity  Skin  Description Hyper-Keratosis Peau d'  Orange Shiny Tight  Fibrotic/ Indurated Fatty Doughy Spongy/ boggy      x mild   x   Skin dry Flaky WNL Macerated   mildly x     Color Redness Varicosities Blanching Hemosiderin Stain Mottled   x x x  x    Odor Malodorous Yeast Fungal infection  WNL      x   Temperature Warm Cool wnl    x     Pitting Edema   1+ 2+ 3+ 4+ Non-pitting   x         Girth Symmetrical Asymmetrical                   Distribution    x toes to groin    Stemmer Sign Positive Negative   +    Lymphorrhea History Of:  Present Absent     x    Wounds History Of Present Absent Venous Arterial Pressure Sheer     x        Signs of Infection Redness Warmth Erythema Acute Swelling Drainage Borders                    Sensation Light Touch Deep pressure Hypersensitivity   Present Impaired Present Impaired Absent Impaired   x x  x  x     Nails WNL   Fungus nail dystrophy     x   Hair Growth Symmetrical Asymmetrical       Skin Creases Base of toes  Ankles   Base of Fingers knees       Abdominal pannus Thigh Lobules  Face/neck   x x           LYMPHEDEMA LIFE IMPACT SCALE (LLIS):32.35% (The extent to which LE-related problems impacted your life last week.)  TREATMENT DATE: 09/28/23 Pt edu for LE self  care MLD to RLE/RLQ RLE compression bandaging  PATIENT EDUCATION:  Continued Pt/ CG edu for lymphedema self care home program throughout session. Topics include outcome of comparative limb volumetrics- starting limb volume differentials (LVDs), technology and gradient techniques used for short stretch, multilayer compression wrapping, simple self-MLD, therapeutic lymphatic pumping exercises, skin/nail care, LE precautions, compression garment recommendations and specifications, wear and care schedule and compression garment donning / doffing w assistive devices. Discussed progress towards all OT goals since commencing CDT. Discussed detrimental impact of obesity on lower and upper extremity lymphedema over  time. Reviewed OT goals for lymphedema care with Pt and discussed progress to date.  All questions answered to the Pt's satisfaction. Good return. Person educated: Patient  Education method: Explanation, Demonstration, and Handouts Education comprehension: verbalized understanding, returned demonstration, verbal cues required, and needs further education    LYMPHEDEMA SELF-CARE HOME PROGRAM: BLE lymphatic pumping there ex using- 1 sets of 10 reps, each exercise in order-  1-2 x daily, bilaterally Simple self MLD 1 x daily Daily skin care to increase hydration, skin mobility and decrease infection risk- can be done during MLD During Intensive Phase of CDT-Compression wraps 23/7 until garment fitting complete  Appropriate daytime compression garments full time. Consider HOS garments/ devices to reduce fibrosis formation and LE progression   ASSESSMENT: CLINICAL IMPRESSION: Completed measurements anatomical measurements for RLE compression stockings. Pt had difficulty donning sample, even with assistive devices. Pt able to don/ doff adjustable CircAid alternative garments after skilled teaching. Measurements reveals Pt REQUIRES CUSTOM CircAid TO ACHIEVE CORRECT FIT. we'll MEASURE NEXT SESSION. Reapplied wraps as established. Cont as per POC.   OBJECTIVE IMPAIRMENTS: Abnormal gait, decreased endurance, decreased knowledge of condition, decreased knowledge of use of DME, decreased mobility, difficulty walking, decreased ROM, decreased strength, increased edema, impaired flexibility, impaired sensation, pain, and chronic leg swelling and associated skin changes.   ACTIVITY LIMITATIONS: Basic and instrumental ADLs ( reaching feet to bathe and groom nails, inspect skin and perform LB skin care, fitting LB clothing and preferred street shoes, meal prep, cleaning, driving, shopping and yard work) Functional ambulation and mobility (carrying, lifting, bending, sitting, standing, squatting, and car  transfers) Leisure pursuits (walking for exercise), productive activities (caring for others), social participation (community activities) , body image  PERSONAL FACTORS: Age, Past/current experiences, Time since onset of injury/illness/exacerbation, and 3+ comorbidities: varicose veins, OA, chronic pain are also affecting patient's functional outcome.   REHAB POTENTIAL: Good  EVALUATION COMPLEXITY: Moderate   GOALS: Goals reviewed with patient? Yes  GOALS: Goals reviewed with patient? Yes   SHORT TERM GOALS: Target date: 4th OT Rx visit    Pt will demonstrate understanding of lymphedema precautions and prevention strategies with modified independence using a printed reference to identify at least 5 precautions and discussing how s/he may implement them into daily life to reduce risk of progression with modified assistance Baseline: max a Goal status: INITIAL   2.  With modified independence (extra time) Pt will be able to apply multilayer, knee length, compression wraps using gradient techniques to decrease limb volume, to limit infection risk, and to limit lymphedema progression.   Baseline: Dependent Goal status: INITIAL   LONG TERM GOALS: Target date: 12/27/23  (12 WEEKS)     Given this patient's Intake score of 32.35 % on the Lymphedema Life Impact Scale (LLIS), patient will experience a reduction of at least 5% in her perceived level of functional impairment resulting from lymphedema to improve functional performance and quality of life (QOL).Baseline: tbd  Baseline: max a Goal status: INITIAL   2.  With modified independence (extra time and assistive devices) Pt will be able to don and doff appropriate compression garments and/or devices to control BLE lymphedema and to limit progression.  Baseline: Dependent Goal status: INITIAL   3. Pt will achieve at least a 10% volume reductions bilaterally below the knees to return limb to more typical size and shape, to limit infection  risk and LE progression, to decrease pain, to improve function, and to improve body image and QOL. Baseline: Dependent Goal status: INITIAL   4. Pt will achieve and sustain at least 85%  compliance with all LE self-care home program components throughout CDT, including modified simple self-MLD, daily skin care and inspection, lymphatic pumping the ex and appropriate compression to limit lymphedema progression and to limit further functional decline. Baseline: Dependent Goal status: INITIAL    PLAN:  OT FREQUENCY: 2x/week and PRN  OT DURATION: 12 weeks and PRN  PLANNED INTERVENTIONS: 97110-Therapeutic exercises, 97530- Therapeutic activity, 97535- Self Care, 96045- Manual therapy, Patient/Family education, Manual lymph drainage, Compression bandaging, and skin care simultaneous w MLD, fit w compression garments  PLAN FOR NEXT SESSION:  BLE comparative limb volumetrics Knee length wraps to one l at a time only Pt edu   Arnold Bicker, MS, OTR/L, CLT-LANA 11/25/23 10:01 AM

## 2023-11-30 ENCOUNTER — Encounter: Payer: Self-pay | Admitting: Occupational Therapy

## 2023-11-30 ENCOUNTER — Ambulatory Visit: Admitting: Occupational Therapy

## 2023-11-30 DIAGNOSIS — I890 Lymphedema, not elsewhere classified: Secondary | ICD-10-CM

## 2023-11-30 NOTE — Therapy (Signed)
 OUTPATIENT OCCUPATIONAL THERAPY  TREATMENT NOTE  LOWER EXTREMITY LYMPHEDEMA   Patient Name: Devin Hale MRN: [id number] DOB:1951-01-02, 73 y.o., male Today's Date: 11/30/2023  END OF SESSION:   OT End of Session - 11/30/23 1311     Visit Number 7    Number of Visits 36    Date for OT Re-Evaluation 12/27/23    OT Start Time 0110    OT Stop Time 0200    OT Time Calculation (min) 50 min    Activity Tolerance Patient tolerated treatment well;No increased pain    Behavior During Therapy WFL for tasks assessed/performed               Past Medical History:  Diagnosis Date   Complete heart block (HCC)    Hepatitis    Hepatitis C    Pacemaker    Past Surgical History:  Procedure Laterality Date   COLONOSCOPY WITH PROPOFOL  N/A 05/27/2020   Procedure: COLONOSCOPY WITH PROPOFOL ;  Surgeon: Marnee Sink, MD;  Location: [facility];  Service: Endoscopy;  Laterality: N/A;   ESOPHAGOGASTRODUODENOSCOPY (EGD) WITH PROPOFOL  N/A 05/27/2020   Procedure: ESOPHAGOGASTRODUODENOSCOPY (EGD) WITH PROPOFOL ;  Surgeon: Marnee Sink, MD;  Location: [facility];  Service: Endoscopy;  Laterality: N/A;   ESOPHAGOGASTRODUODENOSCOPY (EGD) WITH PROPOFOL  N/A 10/07/2022   Procedure: ESOPHAGOGASTRODUODENOSCOPY (EGD) WITH PROPOFOL ;  Surgeon: Marnee Sink, MD;  Location: [facility];  Service: Endoscopy;  Laterality: N/A;  9:30 ARRIVAL TIME DUE TO TRANSPORTATION   IR IVC FILTER RETRIEVAL / S&I /IMG GUID/MOD SED  08/30/2022   IR RADIOLOGIST EVAL & MGMT  08/18/2022   KNEE ARTHROSCOPY Left 1982   LIVER BIOPSY     NASAL SINUS SURGERY  2019   TEMPORARY PACEMAKER N/A 08/13/2019   Procedure: TEMPORARY PACEMAKER;  Surgeon: Wenona Hamilton, MD;  Location: [facility];  Service: Cardiovascular;  Laterality: N/A;   TONSILLECTOMY     Patient Active Problem List   Diagnosis Date Noted   Varicose veins of leg with swelling, bilateral 09/13/2023   Lymphedema 09/13/2023   Varicose veins of leg with pain,  right 11/01/2022   Cirrhosis of liver without ascites (HCC)    Colon cancer screening    Atrial fibrillation (HCC) 05/22/2020   Cardiac pacemaker in situ 04/03/2020   Hepatocellular carcinoma (HCC) 04/03/2020   Other acute postprocedural pain 04/03/2020   Myopathy 03/28/2020   Chronic pain of both knees 03/24/2020   Chronic pain syndrome 03/24/2020   Stimulation of muscle pocket due to and not concurrent with implantation of cardiac pacemaker 01/06/2020   Pericardial effusion 10/04/2019   Chest pain with high risk for cardiac etiology 09/25/2019   AV block, 3rd degree (HCC) 08/13/2019   Complete heart block by electrocardiogram (HCC) 08/13/2019   Chronic rhinitis 01/30/2019   Nasal polyps 01/30/2019   Generalized osteoarthritis 01/22/2019   Age related osteoporosis 01/15/2019   Closed compression fracture of third lumbar vertebra (HCC) 01/15/2019   Chronic venous insufficiency 08/13/2018   Hepatitis C 07/28/2018   Swelling of limb 07/28/2018   Portal hypertension (HCC) 07/19/2018   Myelopathy (HCC) 06/12/2018   Osteoarthritis of knee, unspecified 05/16/2018   Lumbar facet arthropathy 04/30/2018   Long term current use of opiate analgesic 04/30/2018   Cervical spinal stenosis 02/15/2018   Neck pain 02/15/2018   Muscular weakness 02/03/2018   Myalgia 02/03/2018   Bilateral primary osteoarthritis of knee 11/23/2017   Patellar instability of both knees 11/23/2017   Bilateral edema of lower extremity 11/10/2017   Positive ANA (antinuclear  antibody) 11/10/2017   Polyarthralgia 11/10/2017   Anxiety 10/25/2017   Other specified abnormal findings of blood chemistry 10/25/2017    PCP: Alexa Andrews, MD  REFERRING PROVIDER: Sharla Davis, NP  REFERRING DIAG: I89.0  THERAPY DIAG:  Lymphedema, not elsewhere classified  Rationale for Evaluation and Treatment: Rehabilitation  ONSET DATE: ~2018; gradual, insidious onset .  SUBJECTIVE:                                                                                                                                                                                           SUBJECTIVE STATEMENT: Devin Hale presents to OT for lymphedema treatment to RLE. Pt denies LE-related leg pain. Pt presents to clinic wearing multilayer compression wraps on R Leg as directed. Pt states he feels good about his progress thus far.  PERTINENT HISTORY:   Diagnosis Date Noted   Varicose veins of leg with swelling, bilateral 09/13/2023   Lymphedema 09/13/2023   Varicose veins of leg with pain, right 11/01/2022   Cirrhosis of liver without ascites (HCC)    Colon cancer screening    Atrial fibrillation (HCC) 05/22/2020   Cardiac pacemaker in situ 04/03/2020   Hepatocellular carcinoma (HCC) 04/03/2020   Other acute postprocedural pain 04/03/2020   Myopathy 03/28/2020   Chronic pain of both knees 03/24/2020   Chronic pain syndrome 03/24/2020   Stimulation of muscle pocket due to and not concurrent with implantation of cardiac pacemaker 01/06/2020   Pericardial effusion 10/04/2019   Chest pain with high risk for cardiac etiology 09/25/2019   AV block, 3rd degree (HCC) 08/13/2019   Complete heart block by electrocardiogram (HCC) 08/13/2019   Chronic rhinitis 01/30/2019   Nasal polyps 01/30/2019   Generalized osteoarthritis 01/22/2019   Age related osteoporosis 01/15/2019   Closed compression fracture of third lumbar vertebra (HCC) 01/15/2019   Chronic venous insufficiency 08/13/2018   Hepatitis C 07/28/2018   Swelling of limb 07/28/2018   Portal hypertension (HCC) 07/19/2018   Myelopathy (HCC) 06/12/2018   Osteoarthritis of knee, unspecified 05/16/2018   Lumbar facet arthropathy 04/30/2018   Long term current use of opiate analgesic 04/30/2018   Cervical spinal stenosis 02/15/2018   Neck pain 02/15/2018   Muscular weakness 02/03/2018   Myalgia 02/03/2018   Bilateral primary osteoarthritis of knee 11/23/2017   Patellar  instability of both knees 11/23/2017   Bilateral edema of lower extremity 11/10/2017   Positive ANA (antinuclear antibody) 11/10/2017   Polyarthralgia 11/10/2017   Anxiety 10/25/2017   Other specified abnormal findings of blood chemistry 10/25/2017    PAIN: tightness and irritation of skin  PRECAUTIONS: ICD/Pacemaker and Other: Lymphedema Precautions  WEIGHT BEARING RESTRICTIONS: No  FALLS:  Has patient fallen in last 6 months? No  LIVING ENVIRONMENT: Lives with: lives alone Lives in: House/apartment Stairs: No; External: 5 steps; on right going up and on left going up Has following equipment at home: None  OCCUPATION: Retired from [employer]  LEISURE: watch TV, read  HAND DOMINANCE: right   PRIOR LEVEL OF FUNCTION: Independent  PATIENT GOALS: Get swelling down in my legs, so I can wear shorts and be more active   OBJECTIVE: Note: Objective measures were completed at Evaluation unless otherwise noted.  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  SENSATION: WFL Denies numbness  POSTURE: WNL  LE ROM: WFL  LE MMT: TBA  LYMPHEDEMA ASSESSMENTS:   SURGERY TYPE/DATE: Non-Ca related- NA  INFECTIONS: Denies hx cellulitis  WOUNDS: Denies   BLE COMPARATIVE LIMB VOLUMETRICS INITIAL 10/28/23  LANDMARK RIGHT   R LEG (A-D) 3158 ml  R THIGH (E-G) ml  R FULL LIMB (A-G) ml  Limb Volume differential (LVD)  %  Volume change since initial %  Volume change overall V  (Blank rows = not tested)  LANDMARK LEFT    L LEG (A-D) 2946.1  L THIGH (E-G) ml  L FULL LIMB (A-G) ml  Limb Volume differential (LVD)   6.7 %, R>L  Volume change since initial %  Volume change overall %  (Blank rows = not tested)    Mild, Stage  II, Bilateral Lower Extremity Lymphedema 2/2 CVI and Obesity  Skin  Description Hyper-Keratosis Peau d'  Orange Shiny Tight  Fibrotic/ Indurated Fatty Doughy Spongy/ boggy      x mild   x   Skin dry Flaky WNL Macerated   mildly x     Color Redness Varicosities Blanching Hemosiderin Stain Mottled   x x x  x    Odor Malodorous Yeast Fungal infection  WNL      x   Temperature Warm Cool wnl    x     Pitting Edema   1+ 2+ 3+ 4+ Non-pitting   x         Girth Symmetrical Asymmetrical                   Distribution    x toes to groin    Stemmer Sign Positive Negative   +    Lymphorrhea History Of:  Present Absent     x    Wounds History Of Present Absent Venous Arterial Pressure Sheer     x        Signs of Infection Redness Warmth Erythema Acute Swelling Drainage Borders                    Sensation Light Touch Deep pressure Hypersensitivity   Present Impaired Present Impaired Absent Impaired   x x  x  x     Nails WNL   Fungus nail dystrophy     x   Hair Growth Symmetrical Asymmetrical       Skin Creases Base of toes  Ankles   Base of Fingers knees       Abdominal pannus Thigh Lobules  Face/neck   x x           LYMPHEDEMA LIFE IMPACT SCALE (LLIS):32.35% (The extent to which LE-related problems impacted your life last week.)  TREATMENT DATE:  Pt edu for LE self  care Completed anatomical measurements for RLE custom CircAid Juxtafit RLE compression bandaging  PATIENT EDUCATION:  Continued Pt/ CG edu for lymphedema self care home program throughout session. Topics include outcome of comparative limb volumetrics- starting limb volume differentials (LVDs), technology and gradient techniques used for short stretch, multilayer compression wrapping, simple self-MLD, therapeutic lymphatic pumping exercises, skin/nail care, LE precautions, compression garment recommendations and specifications, wear and care schedule and compression garment donning / doffing w assistive devices. Discussed progress towards all OT goals since commencing CDT. Discussed detrimental impact of obesity on  lower and upper extremity lymphedema over time. Reviewed OT goals for lymphedema care with Pt and discussed progress to date.  All questions answered to the Pt's satisfaction. Good return. Person educated: Patient  Education method: Explanation, Demonstration, and Handouts Education comprehension: verbalized understanding, returned demonstration, verbal cues required, and needs further education    LYMPHEDEMA SELF-CARE HOME PROGRAM: BLE lymphatic pumping there ex using- 1 sets of 10 reps, each exercise in order-  1-2 x daily, bilaterally Simple self MLD 1 x daily Daily skin care to increase hydration, skin mobility and decrease infection risk- can be done during MLD During Intensive Phase of CDT-Compression wraps 23/7 until garment fitting complete  Appropriate daytime compression garments full time. Consider HOS garments/ devices to reduce fibrosis formation and LE progression   ASSESSMENT: CLINICAL IMPRESSION: Completed measurements anatomical measurements for  BLE, knee length, CUSTOM, CircAid JUXTAFIT compression leggings are medically necessary to control lymphedema and limit progression. Adjustable, Velcro style, alternative leggings are necessary for ease of donning and doffing as Pt is unable to don and doff standard, elastic compression stockings.  4 pr black Mediven closed toe under socks  (4 for each leg ) are medically necessary for optimal hygiene to limit infection risk.  1 Mediven cover up for each JUXTAFIT legging is necessary to limit contact with Velcro hook with vulnerable skin  Custom-made gradient compression garments are medically necessary because they are uniquely sized and shaped to fit the exact dimensions of the affected extremities, and to provide appropriate medical grade, graduated compression essential for optimally managing chronic, progressive lymphedema. Multiple custom compression garments are needed to ensure proper hygiene to limit infection risk. Custom  compression garments should be replaced q 3-6 months When worn consistently for optimal lipo-lymphedema self-management over time. HOS devices, medically necessary to limit fibrosis buildup in tissue, should be replaced q 2 years and PRN when worn out.     Pt educated re process of accessing insurance benefits through a DME vendor. OT faxed order to vendor after session. Cont as per POC.   OBJECTIVE IMPAIRMENTS: Abnormal gait, decreased endurance, decreased knowledge of condition, decreased knowledge of use of DME, decreased mobility, difficulty walking, decreased ROM, decreased strength, increased edema, impaired flexibility, impaired sensation, pain, and chronic leg swelling and associated skin changes.   ACTIVITY LIMITATIONS: Basic and instrumental ADLs ( reaching feet to bathe and groom nails, inspect skin and perform LB skin care, fitting LB clothing and preferred street shoes, meal prep, cleaning, driving, shopping and yard work) Functional ambulation and mobility (carrying, lifting, bending, sitting, standing, squatting, and car transfers) Leisure pursuits (walking for exercise), productive activities (caring for others), social participation (community activities) , body image  PERSONAL FACTORS: Age, Past/current experiences, Time since onset of injury/illness/exacerbation, and 3+ comorbidities: varicose veins, OA, chronic pain are also affecting patient's functional outcome.   REHAB POTENTIAL: Good  EVALUATION COMPLEXITY: Moderate   GOALS: Goals reviewed with patient? Yes  GOALS: Goals reviewed  with patient? Yes   SHORT TERM GOALS: Target date: 4th OT Rx visit    Pt will demonstrate understanding of lymphedema precautions and prevention strategies with modified independence using a printed reference to identify at least 5 precautions and discussing how s/he may implement them into daily life to reduce risk of progression with modified assistance Baseline: max a Goal status:  INITIAL   2.  With modified independence (extra time) Pt will be able to apply multilayer, knee length, compression wraps using gradient techniques to decrease limb volume, to limit infection risk, and to limit lymphedema progression.   Baseline: Dependent Goal status: INITIAL   LONG TERM GOALS: Target date: 12/27/23  (12 WEEKS)     Given this patient's Intake score of 32.35 % on the Lymphedema Life Impact Scale (LLIS), patient will experience a reduction of at least 5% in her perceived level of functional impairment resulting from lymphedema to improve functional performance and quality of life (QOL).Baseline: tbd Baseline: max a Goal status: INITIAL   2.  With modified independence (extra time and assistive devices) Pt will be able to don and doff appropriate compression garments and/or devices to control BLE lymphedema and to limit progression.  Baseline: Dependent Goal status: INITIAL   3. Pt will achieve at least a 10% volume reductions bilaterally below the knees to return limb to more typical size and shape, to limit infection risk and LE progression, to decrease pain, to improve function, and to improve body image and QOL. Baseline: Dependent Goal status: INITIAL   4. Pt will achieve and sustain at least 85%  compliance with all LE self-care home program components throughout CDT, including modified simple self-MLD, daily skin care and inspection, lymphatic pumping the ex and appropriate compression to limit lymphedema progression and to limit further functional decline. Baseline: Dependent Goal status: INITIAL    PLAN:  OT FREQUENCY: 2x/week and PRN  OT DURATION: 12 weeks and PRN  PLANNED INTERVENTIONS: 97110-Therapeutic exercises, 97530- Therapeutic activity, 97535- Self Care, [id number]- Manual therapy, Patient/Family education, Manual lymph drainage, Compression bandaging, and skin care simultaneous w MLD, fit w compression garments  PLAN FOR NEXT SESSION:  BLE comparative  limb volumetrics Knee length wraps to one l at a time only Pt edu   Arnold Bicker, MS, OTR/L, CLT-LANA 11/30/23 2:00 PM

## 2023-12-02 ENCOUNTER — Ambulatory Visit: Admitting: Occupational Therapy

## 2023-12-02 ENCOUNTER — Ambulatory Visit: Payer: 59

## 2023-12-02 DIAGNOSIS — I442 Atrioventricular block, complete: Secondary | ICD-10-CM

## 2023-12-02 LAB — CUP PACEART REMOTE DEVICE CHECK
Battery Remaining Longevity: 37 mo
Battery Remaining Percentage: 41 %
Battery Voltage: 2.96 V
Brady Statistic AP VP Percent: 28 %
Brady Statistic AP VS Percent: 1 %
Brady Statistic AS VP Percent: 70 %
Brady Statistic AS VS Percent: 1 %
Brady Statistic RA Percent Paced: 26 %
Brady Statistic RV Percent Paced: 98 %
Date Time Interrogation Session: 20250613020014
Implantable Lead Connection Status: 753985
Implantable Lead Connection Status: 753985
Implantable Lead Implant Date: 20210224
Implantable Lead Implant Date: 20210224
Implantable Lead Location: 753859
Implantable Lead Location: 753860
Implantable Lead Model: 7842
Implantable Lead Serial Number: 1032895
Implantable Pulse Generator Implant Date: 20210224
Lead Channel Impedance Value: 360 Ohm
Lead Channel Impedance Value: 490 Ohm
Lead Channel Pacing Threshold Amplitude: 1 V
Lead Channel Pacing Threshold Amplitude: 1.25 V
Lead Channel Pacing Threshold Pulse Width: 1 ms
Lead Channel Pacing Threshold Pulse Width: 1 ms
Lead Channel Sensing Intrinsic Amplitude: 3.3 mV
Lead Channel Sensing Intrinsic Amplitude: 6.3 mV
Lead Channel Setting Pacing Amplitude: 1.5 V
Lead Channel Setting Pacing Amplitude: 2.5 V
Lead Channel Setting Pacing Pulse Width: 1 ms
Lead Channel Setting Sensing Sensitivity: 2 mV
Pulse Gen Model: 2272
Pulse Gen Serial Number: 9197143

## 2023-12-07 ENCOUNTER — Ambulatory Visit: Admitting: Occupational Therapy

## 2023-12-07 DIAGNOSIS — I890 Lymphedema, not elsewhere classified: Secondary | ICD-10-CM | POA: Diagnosis not present

## 2023-12-07 NOTE — Therapy (Signed)
 OUTPATIENT OCCUPATIONAL THERAPY  TREATMENT NOTE  LOWER EXTREMITY LYMPHEDEMA   Patient Name: Devin Hale MRN: [id number] DOB:17-Aug-1950, 73 y.o., male Today's Date: 12/07/2023  END OF SESSION:   OT End of Session - 12/07/23 1529     Visit Number 8    Number of Visits 36    Date for OT Re-Evaluation 12/27/23    OT Start Time 1109    OT Stop Time 1215    OT Time Calculation (min) 66 min    Activity Tolerance Patient tolerated treatment well;No increased pain    Behavior During Therapy WFL for tasks assessed/performed            Past Medical History:  Diagnosis Date   Complete heart block (HCC)    Hepatitis    Hepatitis C    Pacemaker    Past Surgical History:  Procedure Laterality Date   COLONOSCOPY WITH PROPOFOL  N/A 05/27/2020   Procedure: COLONOSCOPY WITH PROPOFOL ;  Surgeon: Marnee Sink, MD;  Location: [facility];  Service: Endoscopy;  Laterality: N/A;   ESOPHAGOGASTRODUODENOSCOPY (EGD) WITH PROPOFOL  N/A 05/27/2020   Procedure: ESOPHAGOGASTRODUODENOSCOPY (EGD) WITH PROPOFOL ;  Surgeon: Marnee Sink, MD;  Location: [facility];  Service: Endoscopy;  Laterality: N/A;   ESOPHAGOGASTRODUODENOSCOPY (EGD) WITH PROPOFOL  N/A 10/07/2022   Procedure: ESOPHAGOGASTRODUODENOSCOPY (EGD) WITH PROPOFOL ;  Surgeon: Marnee Sink, MD;  Location: [facility];  Service: Endoscopy;  Laterality: N/A;  9:30 ARRIVAL TIME DUE TO TRANSPORTATION   IR IVC FILTER RETRIEVAL / S&I /IMG GUID/MOD SED  08/30/2022   IR RADIOLOGIST EVAL & MGMT  08/18/2022   KNEE ARTHROSCOPY Left 1982   LIVER BIOPSY     NASAL SINUS SURGERY  2019   TEMPORARY PACEMAKER N/A 08/13/2019   Procedure: TEMPORARY PACEMAKER;  Surgeon: Wenona Hamilton, MD;  Location: [facility];  Service: Cardiovascular;  Laterality: N/A;   TONSILLECTOMY     Patient Active Problem List   Diagnosis Date Noted   Varicose veins of leg with swelling, bilateral 09/13/2023   Lymphedema 09/13/2023   Varicose veins of leg with pain, right  11/01/2022   Cirrhosis of liver without ascites (HCC)    Colon cancer screening    Atrial fibrillation (HCC) 05/22/2020   Cardiac pacemaker in situ 04/03/2020   Hepatocellular carcinoma (HCC) 04/03/2020   Other acute postprocedural pain 04/03/2020   Myopathy 03/28/2020   Chronic pain of both knees 03/24/2020   Chronic pain syndrome 03/24/2020   Stimulation of muscle pocket due to and not concurrent with implantation of cardiac pacemaker 01/06/2020   Pericardial effusion 10/04/2019   Chest pain with high risk for cardiac etiology 09/25/2019   AV block, 3rd degree (HCC) 08/13/2019   Complete heart block by electrocardiogram (HCC) 08/13/2019   Chronic rhinitis 01/30/2019   Nasal polyps 01/30/2019   Generalized osteoarthritis 01/22/2019   Age related osteoporosis 01/15/2019   Closed compression fracture of third lumbar vertebra (HCC) 01/15/2019   Chronic venous insufficiency 08/13/2018   Hepatitis C 07/28/2018   Swelling of limb 07/28/2018   Portal hypertension (HCC) 07/19/2018   Myelopathy (HCC) 06/12/2018   Osteoarthritis of knee, unspecified 05/16/2018   Lumbar facet arthropathy 04/30/2018   Long term current use of opiate analgesic 04/30/2018   Cervical spinal stenosis 02/15/2018   Neck pain 02/15/2018   Muscular weakness 02/03/2018   Myalgia 02/03/2018   Bilateral primary osteoarthritis of knee 11/23/2017   Patellar instability of both knees 11/23/2017   Bilateral edema of lower extremity 11/10/2017   Positive ANA (antinuclear antibody) 11/10/2017  Polyarthralgia 11/10/2017   Anxiety 10/25/2017   Other specified abnormal findings of blood chemistry 10/25/2017    PCP: Alexa Andrews, MD  REFERRING PROVIDER: Sharla Davis, NP  REFERRING DIAG: I89.0  THERAPY DIAG:  Lymphedema, not elsewhere classified  Rationale for Evaluation and Treatment: Rehabilitation  ONSET DATE: ~2018; gradual, insidious onset .  SUBJECTIVE:                                                                                                                                                                                           SUBJECTIVE STATEMENT: Devin Hale presents to OT for lymphedema treatment to RLE. Pt denies LE-related leg pain. Pt presents to clinic without multilayer compression wraps on R Leg as directed. Pt states he feels good about his progress thus far and he will not wear wraps in public. He has been unable to reach DME vendor about obtaining his compression garments, but I was able to tell him today that vendor let me know that his insurance will not cover them.   PERTINENT HISTORY:   Diagnosis Date Noted   Varicose veins of leg with swelling, bilateral 09/13/2023   Lymphedema 09/13/2023   Varicose veins of leg with pain, right 11/01/2022   Cirrhosis of liver without ascites (HCC)    Colon cancer screening    Atrial fibrillation (HCC) 05/22/2020   Cardiac pacemaker in situ 04/03/2020   Hepatocellular carcinoma (HCC) 04/03/2020   Other acute postprocedural pain 04/03/2020   Myopathy 03/28/2020   Chronic pain of both knees 03/24/2020   Chronic pain syndrome 03/24/2020   Stimulation of muscle pocket due to and not concurrent with implantation of cardiac pacemaker 01/06/2020   Pericardial effusion 10/04/2019   Chest pain with high risk for cardiac etiology 09/25/2019   AV block, 3rd degree (HCC) 08/13/2019   Complete heart block by electrocardiogram (HCC) 08/13/2019   Chronic rhinitis 01/30/2019   Nasal polyps 01/30/2019   Generalized osteoarthritis 01/22/2019   Age related osteoporosis 01/15/2019   Closed compression fracture of third lumbar vertebra (HCC) 01/15/2019   Chronic venous insufficiency 08/13/2018   Hepatitis C 07/28/2018   Swelling of limb 07/28/2018   Portal hypertension (HCC) 07/19/2018   Myelopathy (HCC) 06/12/2018   Osteoarthritis of knee, unspecified 05/16/2018   Lumbar facet arthropathy 04/30/2018   Long term current use of opiate  analgesic 04/30/2018   Cervical spinal stenosis 02/15/2018   Neck pain 02/15/2018   Muscular weakness 02/03/2018   Myalgia 02/03/2018   Bilateral primary osteoarthritis of knee 11/23/2017   Patellar instability of both knees 11/23/2017   Bilateral edema of lower extremity 11/10/2017   Positive ANA (antinuclear  antibody) 11/10/2017   Polyarthralgia 11/10/2017   Anxiety 10/25/2017   Other specified abnormal findings of blood chemistry 10/25/2017    PAIN: tightness and irritation of skin  PRECAUTIONS: ICD/Pacemaker and Other: Lymphedema Precautions   WEIGHT BEARING RESTRICTIONS: No  FALLS:  Has patient fallen in last 6 months? No  LIVING ENVIRONMENT: Lives with: lives alone Lives in: House/apartment Stairs: No; External: 5 steps; on right going up and on left going up Has following equipment at home: None  OCCUPATION: Retired from [employer]  LEISURE: watch TV, read  HAND DOMINANCE: right   PRIOR LEVEL OF FUNCTION: Independent  PATIENT GOALS: Get swelling down in my legs, so I can wear shorts and be more active   OBJECTIVE: Note: Objective measures were completed at Evaluation unless otherwise noted.  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  SENSATION: WFL Denies numbness  POSTURE: WNL  LE ROM: WFL  LE MMT: TBA  LYMPHEDEMA ASSESSMENTS:   SURGERY TYPE/DATE: Non-Ca related- NA  INFECTIONS: Denies hx cellulitis  WOUNDS: Denies   BLE COMPARATIVE LIMB VOLUMETRICS INITIAL 10/28/23  LANDMARK RIGHT   R LEG (A-D) 3158 ml  R THIGH (E-G) ml  R FULL LIMB (A-G) ml  Limb Volume differential (LVD)  %  Volume change since initial %  Volume change overall V  (Blank rows = not tested)  LANDMARK LEFT    L LEG (A-D) 2946.1  L THIGH (E-G) ml  L FULL LIMB (A-G) ml  Limb Volume differential (LVD)   6.7 %, R>L  Volume change since initial %   Volume change overall %  (Blank rows = not tested)    Mild, Stage  II, Bilateral Lower Extremity Lymphedema 2/2 CVI and Obesity  Skin  Description Hyper-Keratosis Peau d'  Orange Shiny Tight Fibrotic/ Indurated Fatty Doughy Spongy/ boggy      x mild   x   Skin dry Flaky WNL Macerated   mildly x     Color Redness Varicosities Blanching Hemosiderin Stain Mottled   x x x  x    Odor Malodorous Yeast Fungal infection  WNL      x   Temperature Warm Cool wnl    x     Pitting Edema   1+ 2+ 3+ 4+ Non-pitting   x         Girth Symmetrical Asymmetrical                   Distribution    x toes to groin    Stemmer Sign Positive Negative   +    Lymphorrhea History Of:  Present Absent     x    Wounds History Of Present Absent Venous Arterial Pressure Sheer     x        Signs of Infection Redness Warmth Erythema Acute Swelling Drainage Borders                    Sensation Light Touch Deep pressure Hypersensitivity   Present Impaired Present Impaired Absent Impaired   x x  x  x     Nails WNL   Fungus nail dystrophy     x   Hair Growth Symmetrical Asymmetrical       Skin Creases Base of toes  Ankles   Base of Fingers knees       Abdominal pannus Thigh Lobules  Face/neck  x x           LYMPHEDEMA LIFE IMPACT SCALE (LLIS):32.35% (The extent to which LE-related problems impacted your life last week.)  TREATMENT DATE:  Pt edu for LE self care Completed anatomical measurements for RLE custom CircAid Juxtafit RLE compression bandaging  PATIENT EDUCATION:  Continued Pt/ CG edu for lymphedema self care home program throughout session. Topics include outcome of comparative limb volumetrics- starting limb volume differentials (LVDs), technology and gradient techniques used for short stretch, multilayer compression wrapping, simple self-MLD, therapeutic lymphatic pumping exercises, skin/nail care, LE precautions, compression garment recommendations and  specifications, wear and care schedule and compression garment donning / doffing w assistive devices. Discussed progress towards all OT goals since commencing CDT. Discussed detrimental impact of obesity on lower and upper extremity lymphedema over time. Reviewed OT goals for lymphedema care with Pt and discussed progress to date.  All questions answered to the Pt's satisfaction. Good return. Person educated: Patient  Education method: Explanation, Demonstration, and Handouts Education comprehension: verbalized understanding, returned demonstration, verbal cues required, and needs further education    LYMPHEDEMA SELF-CARE HOME PROGRAM: BLE lymphatic pumping there ex using- 1 sets of 10 reps, each exercise in order-  1-2 x daily, bilaterally Simple self MLD 1 x daily Daily skin care to increase hydration, skin mobility and decrease infection risk- can be done during MLD During Intensive Phase of CDT-Compression wraps 23/7 until garment fitting complete  Appropriate daytime compression garments full time. Consider HOS garments/ devices to reduce fibrosis formation and LE progression   ASSESSMENT: CLINICAL IMPRESSION: Pt opts out of OT for CDT for BLE lymphedema today stating he will not wear compression wraps in public and it's too hot. He states he is pleased with volume reduction to date. We agree that he will call to schedule a follow up once recommended compression garments are fitted, and PRN. OT provided a copy of garment measurements.   OBJECTIVE IMPAIRMENTS: Abnormal gait, decreased endurance, decreased knowledge of condition, decreased knowledge of use of DME, decreased mobility, difficulty walking, decreased ROM, decreased strength, increased edema, impaired flexibility, impaired sensation, pain, and chronic leg swelling and associated skin changes.   ACTIVITY LIMITATIONS: Basic and instrumental ADLs ( reaching feet to bathe and groom nails, inspect skin and perform LB skin care,  fitting LB clothing and preferred street shoes, meal prep, cleaning, driving, shopping and yard work) Functional ambulation and mobility (carrying, lifting, bending, sitting, standing, squatting, and car transfers) Leisure pursuits (walking for exercise), productive activities (caring for others), social participation (community activities) , body image  PERSONAL FACTORS: Age, Past/current experiences, Time since onset of injury/illness/exacerbation, and 3+ comorbidities: varicose veins, OA, chronic pain are also affecting patient's functional outcome.   REHAB POTENTIAL: Good  EVALUATION COMPLEXITY: Moderate   GOALS: Goals reviewed with patient? Yes  GOALS: Goals reviewed with patient? Yes   SHORT TERM GOALS: Target date: 4th OT Rx visit    Pt will demonstrate understanding of lymphedema precautions and prevention strategies with modified independence using a printed reference to identify at least 5 precautions and discussing how s/he may implement them into daily life to reduce risk of progression with modified assistance Baseline: max a Goal status: 12/07/23 not met   2.  With modified independence (extra time) Pt will be able to apply multilayer, knee length, compression wraps using gradient techniques to decrease limb volume, to limit infection risk, and to limit lymphedema progression.   Baseline: Dependent Goal status:12/07/23 goal met   LONG TERM GOALS:  Target date: 12/27/23  (12 WEEKS)     Given this patient's Intake score of 32.35 % on the Lymphedema Life Impact Scale (LLIS), patient will experience a reduction of at least 5% in her perceived level of functional impairment resulting from lymphedema to improve functional performance and quality of life (QOL).Baseline: tbd Baseline: max a Goal status: 12/07/23 Unable to assess. Pt did not complete retest   2.  With modified independence (extra time and assistive devices) Pt will be able to don and doff appropriate compression  garments and/or devices to control BLE lymphedema and to limit progression.  Baseline: Dependent Goal status: 12/07/23 Not met. Pt did not obtain garments before opting out of CDT   3. Pt will achieve at least a 10% volume reductions bilaterally below the knees to return limb to more typical size and shape, to limit infection risk and LE progression, to decrease pain, to improve function, and to improve body image and QOL. Baseline: Dependent Goal status: 12/07/23 Unable to assess. Pt opted out of CDT before completing retest on 10 th visit   4. Pt will achieve and sustain at least 85%  compliance with all LE self-care home program components throughout CDT, including modified simple self-MLD, daily skin care and inspection, lymphatic pumping the ex and appropriate compression to limit lymphedema progression and to limit further functional decline. Baseline: Dependent Goal status: 12/07/23 Not met. Pt non compliant with compression   PLAN:  OT FREQUENCY: PRN  OT DURATION: PRN  PLANNED INTERVENTIONS: Pt will call to schedule follow up once he obtains compression garments.   PLAN FOR NEXT SESSION:  Follow up for garment fitting   Arnold Bicker, MS, OTR/L, CLT-LANA 12/07/23 3:31 PM

## 2023-12-09 ENCOUNTER — Ambulatory Visit: Admitting: Occupational Therapy

## 2023-12-14 ENCOUNTER — Ambulatory Visit: Admitting: Occupational Therapy

## 2023-12-15 ENCOUNTER — Encounter: Admitting: Cardiology

## 2023-12-16 ENCOUNTER — Ambulatory Visit: Attending: Cardiology | Admitting: Cardiology

## 2023-12-16 ENCOUNTER — Ambulatory Visit: Admitting: Occupational Therapy

## 2023-12-16 ENCOUNTER — Encounter: Payer: Self-pay | Admitting: Cardiology

## 2023-12-16 ENCOUNTER — Ambulatory Visit: Payer: Self-pay | Admitting: Cardiology

## 2023-12-16 VITALS — BP 90/60 | HR 71 | Ht 70.5 in | Wt 166.0 lb

## 2023-12-16 DIAGNOSIS — I5032 Chronic diastolic (congestive) heart failure: Secondary | ICD-10-CM

## 2023-12-16 DIAGNOSIS — I442 Atrioventricular block, complete: Secondary | ICD-10-CM | POA: Diagnosis not present

## 2023-12-16 DIAGNOSIS — Z950 Presence of cardiac pacemaker: Secondary | ICD-10-CM | POA: Diagnosis not present

## 2023-12-16 LAB — CUP PACEART INCLINIC DEVICE CHECK
Battery Remaining Longevity: 39 mo
Battery Voltage: 2.96 V
Brady Statistic RA Percent Paced: 26 %
Brady Statistic RV Percent Paced: 98 %
Date Time Interrogation Session: 20250627151327
Implantable Lead Connection Status: 753985
Implantable Lead Connection Status: 753985
Implantable Lead Implant Date: 20210224
Implantable Lead Implant Date: 20210224
Implantable Lead Location: 753859
Implantable Lead Location: 753860
Implantable Lead Model: 7842
Implantable Lead Serial Number: 1032895
Implantable Pulse Generator Implant Date: 20210224
Lead Channel Impedance Value: 375 Ohm
Lead Channel Impedance Value: 525 Ohm
Lead Channel Pacing Threshold Amplitude: 0.75 V
Lead Channel Pacing Threshold Amplitude: 0.75 V
Lead Channel Pacing Threshold Amplitude: 1 V
Lead Channel Pacing Threshold Amplitude: 1 V
Lead Channel Pacing Threshold Pulse Width: 1 ms
Lead Channel Pacing Threshold Pulse Width: 1 ms
Lead Channel Pacing Threshold Pulse Width: 1 ms
Lead Channel Pacing Threshold Pulse Width: 1 ms
Lead Channel Sensing Intrinsic Amplitude: 2.9 mV
Lead Channel Sensing Intrinsic Amplitude: 6.9 mV
Lead Channel Setting Pacing Amplitude: 1 V
Lead Channel Setting Pacing Amplitude: 2.5 V
Lead Channel Setting Pacing Pulse Width: 1 ms
Lead Channel Setting Sensing Sensitivity: 2 mV
Pulse Gen Model: 2272
Pulse Gen Serial Number: 9197143

## 2023-12-16 NOTE — Patient Instructions (Signed)
 Medication Instructions:  The current medical regimen is effective;  continue present plan and medications as directed. Please refer to the Current Medication list given to you today.   *If you need a refill on your cardiac medications before your next appointment, please call your pharmacy*  Follow-Up: At [facility], you and your health needs are our priority.  As part of our continuing mission to provide you with exceptional heart care, our providers are all part of one team.  This team includes your primary Cardiologist (physician) and Advanced Practice Providers or APPs (Physician Assistants and Nurse Practitioners) who all work together to provide you with the care you need, when you need it.  Your next appointment:   12 month(s)  Provider:   Harvie Liner, MD    We recommend signing up for the patient portal called "MyChart".  Sign up information is provided on this After Visit Summary.  MyChart is used to connect with patients for Virtual Visits (Telemedicine).  Patients are able to view lab/test results, encounter notes, upcoming appointments, etc.  Non-urgent messages can be sent to your provider as well.   To learn more about what you can do with MyChart, go to [email or web address].

## 2023-12-16 NOTE — Progress Notes (Signed)
 Electrophysiology Clinic Note    Date:  12/16/2023  Patient ID:  Devin Hale, Devin Hale 04-06-1951, MRN [id number] PCP:  Sampson Ethridge LABOR, MD  Cardiologist:  None Electrophysiologist: Elspeth Sage, MD   Discussed the use of AI scribe software for clinical note transcription with the patient, who gave verbal consent to proceed.   Patient Profile    Chief Complaint: routine PPM follow-up  History of Present Illness: Devin Hale is a 73 y.o. 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  He continues to see rheumatology for his polymositis, on steroids long term   Patient last saw Dr. Sage 03/2023.  Has chronic lower extremity edema that had improved with ablation of his greater saphenous vein.  On follow-up today, he is doing the same. His lower extremity edema has improved with lymphedema treatment with OT and wrapping his lower legs. He denies chest pain, chest pressure, palpitations, or SOB. He has no concerns with his PPM site.   He is very concerned about his recent lipid panel and A1c drawn by PCP and requests guidance related to treatment.      Arrhythmia/Device History St. Jude dual chamber PPM, dx CHB Bos Sci RV lead    ROS:  Please see the history of present illness. All other systems are reviewed and otherwise negative.    Physical Exam    VS:  BP 90/60   Pulse 71   Ht 5' 10.5 (1.791 m)   Wt 166 lb (75.3 kg)   SpO2 96%   BMI 23.48 kg/m  BMI: Body mass index is 23.48 kg/m.      Wt Readings from Last 3 Encounters:  12/16/23 166 lb (75.3 kg)  09/13/23 172 lb 12.8 oz (78.4 kg)  03/29/23 173 lb (78.5 kg)     GEN- The patient is well appearing, alert and oriented x 3 today.   Lungs- Clear to ausculation bilaterally, normal work of breathing.  Heart- Regular rate and rhythm, no  murmurs, rubs or gallops Extremities- 1+ peripheral edema, warm, dry Skin-   device pocket well-healed, no tethering   Device interrogation done today and reviewed by myself:  Battery 2.9 years Lead thresholds, impedence, sensing stable  Dependent down to VVI 40bpm No episodes No changes made today   Studies Reviewed   Previous EP, cardiology notes.    EKG is ordered. Personal review of EKG from today shows:    EKG Interpretation Date/Time:  Friday December 16 2023 13:31:29 EDT Ventricular Rate:  71 PR Interval:  200 QRS Duration:  148 QT Interval:  404 QTC Calculation: 439 R Axis:   63  Text Interpretation: ATRIAL SENSING VENTRICULAR PACED RHYTHM Confirmed by Sircharles Holzheimer [phone]) on 12/16/2023 1:45:52 PM     TTE, 01/05/2022     Assessment and Plan     #) CHB s/p PPM Device functioning well, see paceart for details High VP   #) h/o AFib No AFib episodes on device interrogation Continue to monitor via remotes  #) elevated cholesterol Recent [facility]work done at [facility] with total cholesterol of 204 Recommended patient discuss treatment with PCP Encourage regular physical activity  #) concern about diabetes Recent A1C normal at 5.4 Encouraged patient to discuss with PCP       Current medicines are reviewed at length with the patient today.   The patient  does not have concerns regarding his medicines.  The following changes were made today:  none  Labs/ tests ordered today include:  Orders Placed This Encounter  Procedures   EKG 12-Lead     Disposition: Follow up with Dr. Cindie or EP APP in 12 months, prefer MD to establish care   Signed, Chantal Needle, NP  12/16/23  2:15 PM  Electrophysiology [facility]

## 2023-12-19 ENCOUNTER — Telehealth: Payer: Self-pay | Admitting: Internal Medicine

## 2023-12-19 NOTE — Telephone Encounter (Signed)
 Pt requesting cb from Olam in ref to his appt on 6/27 (Thats all he would say)

## 2023-12-20 NOTE — Telephone Encounter (Signed)
 Left a message for the patient to call back. (Late post, call placed 12/19/23)

## 2023-12-21 ENCOUNTER — Encounter: Admitting: Occupational Therapy

## 2023-12-21 NOTE — Telephone Encounter (Signed)
 Returned the call to the patient. He asked that I call back at 2 pm.

## 2023-12-21 NOTE — Telephone Encounter (Signed)
 Called and spoke to the patient. He was calling to discuss his last office visit.   He does not want his PCP handling his cholesterol due to all of his comorbidities. He has been advised that at his next cardiology visit this can be dicussed.  He is also concerned about being seen every 12 months instead of every 6 months. He has been advised that he would see general cardiology next and that his EP visits would be yearly unless otherwise warranted.   Nothing further needed at this time.

## 2023-12-21 NOTE — Telephone Encounter (Signed)
 Pt requesting a c/b but he wont give any information. Please advise

## 2023-12-22 ENCOUNTER — Encounter: Admitting: Internal Medicine

## 2023-12-28 ENCOUNTER — Encounter: Admitting: Occupational Therapy

## 2023-12-30 ENCOUNTER — Encounter: Admitting: Occupational Therapy

## 2024-01-04 ENCOUNTER — Encounter: Admitting: Occupational Therapy

## 2024-01-06 ENCOUNTER — Encounter: Admitting: Occupational Therapy

## 2024-01-11 ENCOUNTER — Encounter: Payer: Self-pay | Admitting: Internal Medicine

## 2024-01-11 ENCOUNTER — Encounter: Admitting: Occupational Therapy

## 2024-01-12 ENCOUNTER — Encounter: Admitting: Internal Medicine

## 2024-01-13 ENCOUNTER — Telehealth: Payer: Self-pay | Admitting: Cardiology

## 2024-01-13 ENCOUNTER — Encounter: Admitting: Occupational Therapy

## 2024-01-13 NOTE — Telephone Encounter (Signed)
 Called patient on 07/25 1:51 PM. Patient expressed concern about the time gap between his last appointment with EP and the upcoming 40-month follow-up, specifically regarding the status of his pacemaker during that period. I reassured him that the device is monitored remotely every three months, and if any abnormalities are detected, the care team is automatically alerted and will arrange for him to be seen promptly. I also explained that if he is away from home, the pacemaker stores the data and transmits it to the care team once he returns and reconnects to the monitor.  Ok to follow up with Cindie in [place] unless otherwise informed. NP appt with Agbor-Etang scheduled in August.   Ok Molt

## 2024-01-13 NOTE — Telephone Encounter (Signed)
 New Message:      Patient he had sent several message via My Chart. He would like for you to please call him today.

## 2024-01-18 ENCOUNTER — Encounter: Admitting: Occupational Therapy

## 2024-01-19 NOTE — Progress Notes (Signed)
 Remote pacemaker transmission.

## 2024-01-20 ENCOUNTER — Encounter: Admitting: Occupational Therapy

## 2024-01-25 ENCOUNTER — Encounter: Admitting: Occupational Therapy

## 2024-01-27 ENCOUNTER — Encounter: Admitting: Occupational Therapy

## 2024-02-01 ENCOUNTER — Encounter: Admitting: Occupational Therapy

## 2024-02-03 ENCOUNTER — Encounter: Admitting: Occupational Therapy

## 2024-02-09 ENCOUNTER — Ambulatory Visit: Attending: Cardiology | Admitting: Cardiology

## 2024-02-09 ENCOUNTER — Encounter: Payer: Self-pay | Admitting: Cardiology

## 2024-02-09 VITALS — BP 112/80 | HR 78 | Ht 69.0 in | Wt 160.4 lb

## 2024-02-09 DIAGNOSIS — E7800 Pure hypercholesterolemia, unspecified: Secondary | ICD-10-CM

## 2024-02-09 DIAGNOSIS — I2510 Atherosclerotic heart disease of native coronary artery without angina pectoris: Secondary | ICD-10-CM

## 2024-02-09 DIAGNOSIS — Z950 Presence of cardiac pacemaker: Secondary | ICD-10-CM

## 2024-02-09 MED ORDER — EZETIMIBE 10 MG PO TABS: 10.0000 mg | ORAL_TABLET | Freq: Every day | ORAL | 3 refills | Status: DC

## 2024-02-09 NOTE — Progress Notes (Signed)
 Cardiology Office Note:    Date:  02/09/2024   ID:  Devin Hale, DOB 1951/06/06, MRN [id number]  PCP:  Sampson Ethridge LABOR, MD   [facility] Providers Cardiologist:  None Electrophysiologist:  Elspeth Sage, MD     Referring MD: Sampson Ethridge LABOR,*   Chief Complaint  Patient presents with   Follow-up    Establish care for general cardiology.  Doing well.     History of Present Illness:    Devin Hale is a 73 y.o. male with a hx of CAD (LAD, LCx calcification on chest CT), CHB s/p PPM in 2020, DVT on Eliquis  who presents to establish care.  Previously seen by EP due to history of CHB and pacemaker placement.  Overall doing okay, has frequent pacer checks via device clinic, appears to be functioning normally.  Had a recent blood work, cholesterol noted to be elevated.  Previously taken off statins due to having a history of muscular disease, and joint pains.  Cholesterol was previously controlled.   Echo 2021 EF 55 to 60%.  Past Medical History:  Diagnosis Date   Complete heart block (HCC)    Hepatitis    Hepatitis C    Pacemaker     Past Surgical History:  Procedure Laterality Date   COLONOSCOPY WITH PROPOFOL  N/A 05/27/2020   Procedure: COLONOSCOPY WITH PROPOFOL ;  Surgeon: Jinny Carmine, MD;  Location: [facility];  Service: Endoscopy;  Laterality: N/A;   ESOPHAGOGASTRODUODENOSCOPY (EGD) WITH PROPOFOL  N/A 05/27/2020   Procedure: ESOPHAGOGASTRODUODENOSCOPY (EGD) WITH PROPOFOL ;  Surgeon: Jinny Carmine, MD;  Location: [facility] ENDOSCOPY;  Service: Endoscopy;  Laterality: N/A;   ESOPHAGOGASTRODUODENOSCOPY (EGD) WITH PROPOFOL  N/A 10/07/2022   Procedure: ESOPHAGOGASTRODUODENOSCOPY (EGD) WITH PROPOFOL ;  Surgeon: Jinny Carmine, MD;  Location: [facility] END[facility]COPY;  Service: Endoscopy;  Laterality: N/A;  9:30 ARRIVAL TIME DUE TO TRANSPORTATION   IR IVC FILTER RETRIEVAL / S&I /IMG GUID/MOD SED  08/30/2022   IR RADIOLOGIST EVAL & MGMT  08/18/2022   KNEE ARTHROSCOPY Left 1982    LIVER BIOPSY     NASAL SINUS SURGERY  2019   TEMPORARY PACEMAKER N/A 08/13/2019   Procedure: TEMPORARY PACEMAKER;  Surgeon: Darron Deatrice LABOR, MD;  Location: [facility];  Service: Cardiovascular;  Laterality: N/A;   TONSILLECTOMY      Current Medications: Current Meds  Medication Sig   apixaban  (ELIQUIS ) 2.5 MG TABS tablet Take by mouth 2 (two) times daily.   ascorbic acid  (VITAMIN C) 500 MG tablet Take 500 mg by mouth 2 (two) times daily.    B Complex-Biotin-FA (VITAMIN B50 COMPLEX PO) Take by mouth daily.   cholecalciferol  (VITAMIN D3) 25 MCG (1000 UNIT) tablet Take 1,000 Units by mouth in the morning and at bedtime.    Coenzyme Q10 (CO Q 10) 100 MG CAPS Take 100 mg by mouth daily.   denosumab (PROLIA) 60 MG/ML SOSY injection Inject 60 mg into the skin every 6 (six) months.   ezetimibe  (ZETIA ) 10 MG tablet Take 1 tablet (10 mg total) by mouth daily.   magnesium  oxide (MAG-OX) 400 MG tablet Take 200 mg by mouth 2 (two) times daily.   Multiple Vitamin (MULTIVITAMIN WITH MINERALS) TABS tablet Take 1 tablet by mouth daily.   Omega-3 Fatty Acids (FISH OIL) 1000 MG CAPS Take 2 capsules by mouth daily.   predniSONE (DELTASONE) 5 MG tablet Take 5 mg by mouth daily with breakfast.   vitamin E  200 UNIT capsule Take 200 Units by mouth daily.   Whey Protein POWD Take  15 g by mouth daily.   [DISCONTINUED] metroNIDAZOLE (METROGEL) 1 % gel Apply 1 Application topically daily as needed.   [DISCONTINUED] PREDNISONE PO Take 7.5 mg by mouth daily.     Allergies:   Patient has no known allergies.   Social History   Socioeconomic History   Marital status: Single    Spouse name: Not on file   Number of children: Not on file   Years of education: Not on file   Highest education level: Not on file  Occupational History   Not on file  Tobacco Use   Smoking status: Never   Smokeless tobacco: Never  Vaping Use   Vaping status: Never Used  Substance and Sexual Activity   Alcohol use: No    Drug use: Never   Sexual activity: Not on file  Other Topics Concern   Not on file  Social History Narrative   Not on file   Social Drivers of Health   Financial Resource Strain: Not on file  Food Insecurity: No Food Insecurity (08/24/2023)   Received from [facility]   Hunger Vital Sign    Within the past 12 months, you worried that your food would run out before you got the money to buy more.: Never true    Within the past 12 months, the food you bought just didn't last and you didn't have money to get more.: Never true  Transportation Needs: No Transportation Needs (08/24/2023)   Received from [facility] - Transportation    In the past 12 months, has lack of transportation kept you from medical appointments or from getting medications?: No    Lack of Transportation (Non-Medical): No  Physical Activity: Inactive (02/08/2023)   [facility]   Exercise Vital Sign    On average, how many days per week do you engage in moderate to strenuous exercise (like a brisk walk)?: 0 days    On average, how many minutes do you engage in exercise at this level?: 0 min  Stress: Not on file  Social Connections: Not on file     Family History: The patient's family history includes Heart attack in his father; Non-Hodgkin's lymphoma in his mother.  ROS:   Please see the history of present illness.     All other systems reviewed and are negative.  EKGs/Labs/Other Studies Reviewed:    The following studies were reviewed today:       Recent Labs: No results found for requested labs within last 365 days.  Recent Lipid Panel No results found for: CHOL, TRIG, HDL, CHOLHDL, VLDL, LDLCALC, LDLDIRECT   Risk Assessment/Calculations:             Physical Exam:    VS:  BP 112/80 (BP Location: Left Arm, Patient Position: Sitting, Cuff Size: Normal)   Pulse 78   Ht 5' 9 (1.753 m)   Wt 160 lb 6 oz (72.7 kg)   SpO2 96%    BMI 23.68 kg/m     Wt Readings from Last 3 Encounters:  02/09/24 160 lb 6 oz (72.7 kg)  12/16/23 166 lb (75.3 kg)  09/13/23 172 lb 12.8 oz (78.4 kg)     GEN:  Well nourished, well developed in no acute distress HEENT: Normal NECK: No JVD; No carotid bruits CARDIAC: RRR, no murmurs, rubs, gallops RESPIRATORY:  Clear to auscultation without rales, wheezing or rhonchi  ABDOMEN: Soft, non-tender, non-distended MUSCULOSKELETAL:  No edema; No deformity  SKIN:  Warm and dry NEUROLOGIC:  Alert and oriented x 3 PSYCHIATRIC:  Normal affect   ASSESSMENT:    1. Coronary artery disease involving native coronary artery of native heart, unspecified whether angina present   2. Pure hypercholesterolemia   3. Pacemaker    PLAN:    In order of problems listed above:  CAD, LAD, LCx calcifications.  Denies chest pain.  On Eliquis  2.5 mg twice daily due to DVT.  Start Zetia  10 mg daily, obtain echo. Hyperlipidemia, history of myalgias.  Start Zetia  10 mg daily.  Repeat lipid panel in 3 months. CHB s/p pacemaker.  Monitor per device clinic.  Follow-up in 3 months      Medication Adjustments/Labs and Tests Ordered: Current medicines are reviewed at length with the patient today.  Concerns regarding medicines are outlined above.  Orders Placed This Encounter  Procedures   Lipid panel   Lipid panel   ECHOCARDIOGRAM COMPLETE   Meds ordered this encounter  Medications   ezetimibe  (ZETIA ) 10 MG tablet    Sig: Take 1 tablet (10 mg total) by mouth daily.    Dispense:  90 tablet    Refill:  3    Patient Instructions  Medication Instructions:  - START zetia  10 mg daily   *If you need a refill on your cardiac medications before your next appointment, please call your pharmacy*  Lab Work: Your provider would like for you to have following labs drawn today lipid panel.  Your provider would like for you to return in 3 months to have the following labs drawn: lipid pnael.   Please go to  [facility] [address] ([facility] [address] [facility]) [address], [place] [address] You do not need an appointment.  They are open from 8 am- 4:30 pm.  Lunch from 1:00 pm- 2:00 pm You DO need to be fasting.  If you have labs (blood work) drawn today and your tests are completely normal, you will receive your results only by: MyChart Message (if you have MyChart) OR A paper copy in the mail If you have any lab test that is abnormal or we need to change your treatment, we will call you to review the results.  Testing/Procedures: Your physician has requested that you have an echocardiogram. Echocardiography is a painless test that uses sound waves to create images of your heart. It provides your doctor with information about the size and shape of your heart and how well your heart's chambers and valves are working.   You may receive an ultrasound enhancing agent through an IV if needed to better visualize your heart during the echo. This procedure takes approximately one hour.  There are no restrictions for this procedure.  This will take place at [address] [facility]) [address], [place] [address]  Please note: We ask at that you not bring children with you during ultrasound (echo/ vascular) testing. Due to room size and safety concerns, children are not allowed in the ultrasound rooms during exams. Our front office staff cannot provide observation of children in our lobby area while testing is being conducted. An adult accompanying a patient to their appointment will only be allowed in the ultrasound room at the discretion of the ultrasound technician under special circumstances. We apologize for any inconvenience.   Follow-Up: At [facility], you and your health needs are our priority.  As part of our continuing mission to provide you with exceptional heart care, our providers are all part of one team.  This team includes your primary Cardiologist (physician) and  Advanced Practice Providers or APPs (Physician Assistants and Nurse Practitioners) who all work together to provide you with the care you need, when you need it.  Your next appointment:   4 month(s)  Provider:   You may see Dr. Darliss or one of the following Advanced Practice Providers on your designated Care Team:   Lonni Meager, NP Lesley Maffucci, PA-C Bernardino Bring, PA-C Cadence [place], PA-C Tylene Lunch, NP Barnie Hila, NP    We recommend signing up for the patient portal called MyChart.  Sign up information is provided on this After Visit Summary.  MyChart is used to connect with patients for Virtual Visits (Telemedicine).  Patients are able to view lab/test results, encounter notes, upcoming appointments, etc.  Non-urgent messages can be sent to your provider as well.   To learn more about what you can do with MyChart, go to [email or web address].          Signed, Redell Darliss, MD  02/09/2024 12:52 PM    [facility]

## 2024-02-09 NOTE — Patient Instructions (Signed)
 Medication Instructions:  - START zetia  10 mg daily   *If you need a refill on your cardiac medications before your next appointment, please call your pharmacy*  Lab Work: Your provider would like for you to have following labs drawn today lipid panel.  Your provider would like for you to return in 3 months to have the following labs drawn: lipid pnael.   Please go to [facility] [address] (Medical [facility]) [address], [place] [address] You do not need an appointment.  They are open from 8 am- 4:30 pm.  Lunch from 1:00 pm- 2:00 pm You DO need to be fasting.  If you have labs (blood work) drawn today and your tests are completely normal, you will receive your results only by: MyChart Message (if you have MyChart) OR A paper copy in the mail If you have any lab test that is abnormal or we need to change your treatment, we will call you to review the results.  Testing/Procedures: Your physician has requested that you have an echocardiogram. Echocardiography is a painless test that uses sound waves to create images of your heart. It provides your doctor with information about the size and shape of your heart and how well your heart's chambers and valves are working.   You may receive an ultrasound enhancing agent through an IV if needed to better visualize your heart during the echo. This procedure takes approximately one hour.  There are no restrictions for this procedure.  This will take place at [address] [facility]) [address], [place] [address]  Please note: We ask at that you not bring children with you during ultrasound (echo/ vascular) testing. Due to room size and safety concerns, children are not allowed in the ultrasound rooms during exams. Our front office staff cannot provide observation of children in our lobby area while testing is being conducted. An adult accompanying a patient to their appointment will only be allowed in the ultrasound  room at the discretion of the ultrasound technician under special circumstances. We apologize for any inconvenience.   Follow-Up: At [facility], you and your health needs are our priority.  As part of our continuing mission to provide you with exceptional heart care, our providers are all part of one team.  This team includes your primary Cardiologist (physician) and Advanced Practice Providers or APPs (Physician Assistants and Nurse Practitioners) who all work together to provide you with the care you need, when you need it.  Your next appointment:   4 month(s)  Provider:   You may see Dr. Darliss or one of the following Advanced Practice Providers on your designated Care Team:   Lonni Meager, NP Lesley Maffucci, PA-C Bernardino Bring, PA-C Cadence [place], PA-C Tylene Lunch, NP Barnie Hila, NP    We recommend signing up for the patient portal called MyChart.  Sign up information is provided on this After Visit Summary.  MyChart is used to connect with patients for Virtual Visits (Telemedicine).  Patients are able to view lab/test results, encounter notes, upcoming appointments, etc.  Non-urgent messages can be sent to your provider as well.   To learn more about what you can do with MyChart, go to [email or web address].

## 2024-02-10 ENCOUNTER — Ambulatory Visit: Payer: Self-pay | Admitting: Cardiology

## 2024-02-10 LAB — LIPID PANEL
Chol/HDL Ratio: 4.1 ratio (ref 0.0–5.0)
Cholesterol, Total: 209 mg/dL — ABNORMAL HIGH (ref 100–199)
HDL: 51 mg/dL (ref >39)
LDL Chol Calc (NIH): 140 mg/dL — ABNORMAL HIGH (ref 0–99)
Triglycerides: 98 mg/dL (ref 0–149)
VLDL Cholesterol Cal: 18 mg/dL (ref 5–40)

## 2024-02-20 ENCOUNTER — Encounter: Payer: Self-pay | Admitting: Cardiology

## 2024-02-23 ENCOUNTER — Other Ambulatory Visit: Payer: Self-pay | Admitting: Cardiology

## 2024-02-23 MED ORDER — BEMPEDOIC ACID 180 MG PO TABS: 180.0000 mg | ORAL_TABLET | Freq: Every day | ORAL | 3 refills | Status: DC

## 2024-02-23 NOTE — Addendum Note (Signed)
 Addended by: BRIEN SALM on: 02/23/2024 08:23 AM   Modules accepted: Orders

## 2024-02-23 NOTE — Telephone Encounter (Signed)
 Error

## 2024-03-02 ENCOUNTER — Ambulatory Visit

## 2024-03-02 DIAGNOSIS — I5032 Chronic diastolic (congestive) heart failure: Secondary | ICD-10-CM | POA: Diagnosis not present

## 2024-03-05 LAB — CUP PACEART REMOTE DEVICE CHECK
Battery Remaining Longevity: 35 mo
Battery Remaining Percentage: 38 %
Battery Voltage: 2.96 V
Brady Statistic AP VP Percent: 35 %
Brady Statistic AP VS Percent: 1 %
Brady Statistic AS VP Percent: 64 %
Brady Statistic AS VS Percent: 1 %
Brady Statistic RA Percent Paced: 34 %
Brady Statistic RV Percent Paced: 99 %
Date Time Interrogation Session: 20250912020017
Implantable Lead Connection Status: 753985
Implantable Lead Connection Status: 753985
Implantable Lead Implant Date: 20210224
Implantable Lead Implant Date: 20210224
Implantable Lead Location: 753859
Implantable Lead Location: 753860
Implantable Lead Model: 7842
Implantable Lead Serial Number: 1032895
Implantable Pulse Generator Implant Date: 20210224
Lead Channel Impedance Value: 380 Ohm
Lead Channel Impedance Value: 490 Ohm
Lead Channel Pacing Threshold Amplitude: 1 V
Lead Channel Pacing Threshold Amplitude: 1.125 V
Lead Channel Pacing Threshold Pulse Width: 1 ms
Lead Channel Pacing Threshold Pulse Width: 1 ms
Lead Channel Sensing Intrinsic Amplitude: 2.5 mV
Lead Channel Sensing Intrinsic Amplitude: 6.9 mV
Lead Channel Setting Pacing Amplitude: 1.375
Lead Channel Setting Pacing Amplitude: 2.5 V
Lead Channel Setting Pacing Pulse Width: 1 ms
Lead Channel Setting Sensing Sensitivity: 2 mV
Pulse Gen Model: 2272
Pulse Gen Serial Number: 9197143

## 2024-03-08 ENCOUNTER — Ambulatory Visit: Payer: Self-pay | Admitting: Cardiology

## 2024-03-09 NOTE — Progress Notes (Signed)
 Remote PPM Transmission

## 2024-03-15 ENCOUNTER — Telehealth: Payer: Self-pay | Admitting: Pharmacy Technician

## 2024-03-15 NOTE — Telephone Encounter (Signed)
 Pharmacy Patient Advocate Encounter  Received notification from [employer] that Prior Authorization for nexletol  has been APPROVED from 03/15/24 to 09/12/24   PA #/Case ID/Reference #: [id number]

## 2024-03-15 NOTE — Telephone Encounter (Signed)
   Pharmacy Patient Advocate Encounter   Received notification from CoverMyMeds that prior authorization for nexletol  is required/requested.   Insurance verification completed.   The patient is insured through [facility] .   Per test claim: PA required; PA submitted to above mentioned insurance via Latent Key/confirmation #/EOC [id number] Status is pending

## 2024-03-21 ENCOUNTER — Other Ambulatory Visit

## 2024-04-06 DIAGNOSIS — M810 Age-related osteoporosis without current pathological fracture: Principal | ICD-10-CM

## 2024-04-17 ENCOUNTER — Inpatient Hospital Stay: Admit: 2024-04-17 | Discharge: 2024-04-18 | Payer: Medicare (Managed Care)

## 2024-04-17 ENCOUNTER — Telehealth: Payer: Self-pay | Admitting: Cardiology

## 2024-04-17 NOTE — Telephone Encounter (Signed)
 Spoke with pt; verified pt using 2 identifiers; pt inquiring about a call from [facility] regarding approval of a 3D scan Code # 23623.  He wanted to know who ordered the test and what it was for.  In researching pt's chart, the only cardiology related procedures scheduled is an Echocardiogram on 10/31 and then mulitple Remote Pacer Checks.  I also researched in CareEverywhere to see if an outlying clinic had ordered any testing, but I found nothing that mentioned 3D imaging.  Pt stated that he will follow up with [facility].  Pt proceeded to complain about our call system and wanted to know why he couldn't call the clinic directly.  I explained to him that all of [facility] clinics calls were filtered through the call center and sent to the proper clinics.  He stated that would be fine if he didn't have to hold 30-45 minutes to talk with someone.  I told him that  I understood his frustration and that all I could do is pass along his concerns/complaints to our manager.  Pt stated that he had a direct line to our office manager and that Ok is helpful.  He stated that he had already spoken with her.  Pt thanked me for returning his call.

## 2024-04-17 NOTE — Telephone Encounter (Signed)
 Patient asking to speak with the nurse

## 2024-04-17 NOTE — Telephone Encounter (Signed)
 Returned pt call; no answer; left message to call our office between 8a - 5p @ [phone].

## 2024-04-19 ENCOUNTER — Telehealth: Payer: Self-pay | Admitting: Cardiology

## 2024-04-19 NOTE — Telephone Encounter (Signed)
 Notation placed in chart for communication

## 2024-04-19 NOTE — Telephone Encounter (Signed)
 Patient stated he wants only to be contacted by voice call or by letter and he does not want to be contact through MyChart and he is having an issue with that program.  Patient wants a call back to confirm.

## 2024-04-20 ENCOUNTER — Telehealth: Payer: Self-pay | Admitting: Cardiology

## 2024-04-20 ENCOUNTER — Ambulatory Visit: Attending: Cardiology

## 2024-04-20 DIAGNOSIS — I2510 Atherosclerotic heart disease of native coronary artery without angina pectoris: Secondary | ICD-10-CM | POA: Diagnosis not present

## 2024-04-20 LAB — ECHOCARDIOGRAM COMPLETE
AR max vel: 1.99 cm2
AV Area VTI: 1.86 cm2
AV Area mean vel: 1.92 cm2
AV Mean grad: 3 mmHg
AV Peak grad: 5.9 mmHg
Ao pk vel: 1.21 m/s
Area-P 1/2: 2.24 cm2
MV M vel: 2.45 m/s
MV Peak grad: 24 mmHg
S' Lateral: 3.3 cm

## 2024-04-20 NOTE — Telephone Encounter (Signed)
 Left a message for the patient to call back.

## 2024-04-20 NOTE — Telephone Encounter (Signed)
 Patient is requesting a call back from [place] or [place] regarding what is going on. Pt requests that someone please call him back after 3pm.

## 2024-04-23 ENCOUNTER — Inpatient Hospital Stay
Admit: 2024-04-23 | Discharge: 2024-04-24 | Payer: Medicare (Managed Care) | Attending: Radiation Oncology | Primary: Radiation Oncology

## 2024-04-23 ENCOUNTER — Ambulatory Visit: Admit: 2024-04-23 | Payer: Medicare (Managed Care) | Attending: Radiation Oncology | Primary: Radiation Oncology

## 2024-04-23 NOTE — Telephone Encounter (Signed)
Left a message to call back if anything further was needed.  

## 2024-04-24 NOTE — Telephone Encounter (Signed)
 Called the patient back to discuss his question concerning the notification from his insurance and the order of a 3D test. It was explained to him the difference between the 2D and a 3D echocardiogram. He understood and had no further questions but was concerned over the delay in getting the answer to him. He was advised that we had tried to call him back two times and left messages for him to call us  back.   His echo results have been given to him and he verbalized his understanding: Per Dr. Darliss: Echo shows normal systolic function, no significant structural abnormalities.

## 2024-04-25 ENCOUNTER — Ambulatory Visit
Admit: 2024-04-25 | Discharge: 2024-04-25 | Payer: Medicare (Managed Care) | Attending: "Endocrinology | Primary: "Endocrinology

## 2024-04-25 ENCOUNTER — Ambulatory Visit: Admit: 2024-04-25 | Discharge: 2024-04-25 | Payer: Medicare (Managed Care)

## 2024-04-25 ENCOUNTER — Telehealth: Payer: Self-pay | Admitting: Cardiology

## 2024-04-25 DIAGNOSIS — M810 Age-related osteoporosis without current pathological fracture: Principal | ICD-10-CM

## 2024-04-25 DIAGNOSIS — S32030D Wedge compression fracture of third lumbar vertebra, subsequent encounter for fracture with routine healing: Principal | ICD-10-CM

## 2024-04-25 DIAGNOSIS — C220 Liver cell carcinoma: Principal | ICD-10-CM

## 2024-04-25 DIAGNOSIS — G959 Disease of spinal cord, unspecified: Principal | ICD-10-CM

## 2024-04-25 NOTE — Telephone Encounter (Signed)
 Called patient to follow up on any additional questions or concerns regarding pre authorization testing from [facility]. Left msg on VM to return call.   Devin Hale

## 2024-04-30 ENCOUNTER — Ambulatory Visit: Payer: Self-pay | Admitting: Cardiology

## 2024-04-30 NOTE — Telephone Encounter (Signed)
 Pt returning call. Please advise.

## 2024-05-01 NOTE — Telephone Encounter (Signed)
 Returned patients call today at 3:22 PM

## 2024-05-08 ENCOUNTER — Ambulatory Visit
Admit: 2024-05-08 | Discharge: 2024-05-09 | Payer: Medicare (Managed Care) | Attending: Audiologist | Primary: Audiologist

## 2024-05-08 DIAGNOSIS — H903 Sensorineural hearing loss, bilateral: Principal | ICD-10-CM

## 2024-05-08 DIAGNOSIS — J310 Chronic rhinitis: Principal | ICD-10-CM

## 2024-05-08 MED ORDER — AZELASTINE 137 MCG (0.1 %) NASAL SPRAY
Freq: Two times a day (BID) | NASAL | 2 refills | 109.00000 days | Status: CP
Start: 2024-05-08 — End: 2025-05-08

## 2024-05-09 ENCOUNTER — Telehealth: Payer: Self-pay | Admitting: Cardiology

## 2024-05-10 ENCOUNTER — Telehealth: Payer: Self-pay | Admitting: Cardiology

## 2024-05-10 ENCOUNTER — Other Ambulatory Visit: Payer: Self-pay | Admitting: Emergency Medicine

## 2024-05-10 DIAGNOSIS — E7800 Pure hypercholesterolemia, unspecified: Secondary | ICD-10-CM

## 2024-05-10 DIAGNOSIS — I2510 Atherosclerotic heart disease of native coronary artery without angina pectoris: Secondary | ICD-10-CM

## 2024-05-10 NOTE — Telephone Encounter (Signed)
 Pt is requesting a c/b from a nurse. Pt is doing his lipid panel tomorrow and would like to know how many hours to fast please advise

## 2024-05-10 NOTE — Telephone Encounter (Signed)
 Called and spoke with patient. Answered all questions regarding upcoming lab draw. Patient verbalizes understanding.

## 2024-05-15 NOTE — Telephone Encounter (Signed)
 Patient returned call at 3:06 p.m.  Patient was concerned regarding the amount of time it took to answer his questions regarding testing and pre-cert.  I went over the process and explained that many of the people involved were new to their role.  He verbalized understanding and appreciated the follow up.  No further follow up needed.

## 2024-05-21 ENCOUNTER — Encounter: Payer: Self-pay | Admitting: Cardiology

## 2024-05-21 ENCOUNTER — Other Ambulatory Visit: Payer: Self-pay | Admitting: Internal Medicine

## 2024-05-21 DIAGNOSIS — R634 Abnormal weight loss: Secondary | ICD-10-CM

## 2024-05-29 ENCOUNTER — Ambulatory Visit

## 2024-05-31 NOTE — Progress Notes (Unsigned)
 Cardiology Office Note    Date:  06/05/2024   ID:  Devin Hale, DOB 05/20/1951, MRN [id number]  PCP:  Sampson Ethridge LABOR, MD  Cardiologist:  Redell Cave, MD  Electrophysiologist:  Elspeth Sage, MD (Inactive)   Chief Complaint: Follow-up  History of Present Illness:   Devin Hale is a 73 y.o. 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  He presented to [facility] ED in 08/2019 and was found to be in complete heart block requiring emergent temp wire.  He was transferred to [facility] with LHC showing nonobstructive CAD.  Echo at that time showed an EF of 50% with hypokinesis of the septal and inferior walls, moderate LVH, and trivial mitral regurgitation and aortic insufficiency.  He subsequently underwent PPM plantation in 2021 complicated by atrial lead revision for micro-dislodgment and chest pain felt to be consistent with pericarditis treated with colchicine .  Echo at that time demonstrated an EF of 55% with trivial pericardial effusion.  Echo 12/2021 showed an EF of 55 to 65%, normal diastolic function, normal RV systolic function and ventricular cavity size, and RVSP, and no pericardial effusion.  Note indicates the patient had postprocedure A-fib following PPM implantation.  Cardiac MRI in 02/2020 showed preserved LV systolic function, normal RV systolic function, trileaflet aortic valve, and abnormal delayed enhancement not consistent with cardiac amyloid, though could reflect cardiac sarcoid versus distal secondary branch CAD.  Myocardial PET/CT in 2022 showed normal perfusion with normal LV systolic function.  Repeat cardiac MRI in 2023 was not highly suggestive of cardiac sarcoid without evidence of acute/active disease.  He  was most recently seen in the office in 01/2024 and was doing well from a cardiac perspective.  It was recommended he start ezetimibe  10 mg and obtain an echo.  He subsequently notified our office was that ezetimibe  was causing stomach cramps and GI issues with recommendation to discontinue and start bempedoic acid .  Echo was obtained in 03/2024 and showed an EF of 55 to 60%, no regional wall motion abnormalities, mild LVH, grade 1 diastolic dysfunction, normal RV systolic function and ventricular cavity size, mild mitral regurgitation, and a normal CVP.  Pacemaker interrogations have shown no further evidence of A-fib.  He has been maintained on apixaban  2.5 mg twice daily, presumably in the setting of DVT history.  Per discussion with our EP team, no EP indication for anticoagulation.  He comes in today and is doing well from a cardiac perspective, without symptoms of angina or cardiac decompensation.  No palpitations, dizziness, presyncope, or syncope.  Follow-up labs obtained last month show improvement in cholesterol readings.  He has not significantly changed his diet when compared to labs taken in the spring 2025.  He did not start bempedoic acid  and is not currently on lipid-lowering pharmacotherapy.  No lower extremity swelling or progressive orthopnea.  No falls or symptoms concerning for bleeding.    Labs independently reviewed: 04/2024 - creatinine 0.79, TSH normal, Hgb 14.7, PLT 128, BUN 19, serum creatinine 0.78, potassium 4.9, albumin 4.4, AST/ALT normal, TC 191, TG 108, HDL 54, LDL 118 11/2023 - A1c 5.4   Past Medical History:  Diagnosis Date   Complete heart block (HCC)    Hepatitis    Hepatitis C  Hyperlipidemia    Pacemaker    Polymyositis [facility])     Past Surgical History:  Procedure Laterality Date   COLONOSCOPY WITH PROPOFOL  N/A 05/27/2020   Procedure: COLONOSCOPY WITH PROPOFOL ;  Surgeon: Jinny Carmine, MD;  Location: [facility] ENDOSCOPY;  Service: Endoscopy;  Laterality: N/A;    ESOPHAGOGASTRODUODENOSCOPY (EGD) WITH PROPOFOL  N/A 05/27/2020   Procedure: ESOPHAGOGASTRODUODENOSCOPY (EGD) WITH PROPOFOL ;  Surgeon: Jinny Carmine, MD;  Location: [facility] ENDOSCOPY;  Service: Endoscopy;  Laterality: N/A;   ESOPHAGOGASTRODUODENOSCOPY (EGD) WITH PROPOFOL  N/A 10/07/2022   Procedure: ESOPHAGOGASTRODUODENOSCOPY (EGD) WITH PROPOFOL ;  Surgeon: Jinny Carmine, MD;  Location: [facility] ENDOSCOPY;  Service: Endoscopy;  Laterality: N/A;  9:30 ARRIVAL TIME DUE TO TRANSPORTATION   IR IVC FILTER RETRIEVAL / S&I /IMG GUID/MOD SED  08/30/2022   IR RADIOLOGIST EVAL & MGMT  08/18/2022   KNEE ARTHROSCOPY Left 1982   LIVER BIOPSY     NASAL SINUS SURGERY  2019   TEMPORARY PACEMAKER N/A 08/13/2019   Procedure: TEMPORARY PACEMAKER;  Surgeon: Darron Deatrice LABOR, MD;  Location: [facility] INVASIVE CV LAB;  Service: Cardiovascular;  Laterality: N/A;   TONSILLECTOMY      Current Medications: Active Medications[1]  Allergies:   Zetia  [ezetimibe ]   Social History   Socioeconomic History   Marital status: Single    Spouse name: Not on file   Number of children: Not on file   Years of education: Not on file   Highest education level: Not on file  Occupational History   Not on file  Tobacco Use   Smoking status: Never   Smokeless tobacco: Never  Vaping Use   Vaping status: Never Used  Substance and Sexual Activity   Alcohol use: No   Drug use: Never   Sexual activity: Not on file  Other Topics Concern   Not on file  Social History Narrative   Not on file   Social Drivers of Health   Tobacco Use: Low Risk (06/05/2024)   Patient History    Smoking Tobacco Use: Never    Smokeless Tobacco Use: Never    Passive Exposure: Not on file  Financial Resource Strain: Not on file  Food Insecurity: No Food Insecurity (08/24/2023)   Received from [facility]   Epic    Within the past 12 months, you worried that your food would run out before you got the money to buy more.: Never true    Within  the past 12 months, the food you bought just didn't last and you didn't have money to get more.: Never true  Transportation Needs: No Transportation Needs (08/24/2023)   Received from [facility] - Transportation    In the past 12 months, has lack of transportation kept you from medical appointments or from getting medications?: No    Lack of Transportation (Non-Medical): No  Physical Activity: Inactive (02/08/2023)   Received from [facility]   Exercise Vital Sign    On average, how many days per week do you engage in moderate to strenuous exercise (like a brisk walk)?: 0 days    On average, how many minutes do you engage in exercise at this level?: 0 min  Stress: Not on file  Social Connections: Not on file  Depression (EYV7-0): Not on file  Alcohol Screen: Not on file  Housing: Unknown (08/06/2023)   Received from [facility]   Epic    Unable to Pay for Housing in the Last Year: Not on file  Number of Times Moved in the Last Year: Not on file    At any time in the past 12 months, were you homeless or living in a shelter (including now)?: No  Utilities: Not At Risk (02/16/2023)   Received from [facility] Utilities    Threatened with loss of utilities: No  Health Literacy: Not on file     Family History:  The patient's family history includes Heart attack in his father; Non-Hodgkin's lymphoma in his mother.  ROS:   12-point review of systems is negative unless otherwise noted in the HPI.   EKGs/Labs/Other Studies Reviewed:    Studies reviewed were summarized above. The additional studies were reviewed today:  2D echo 04/20/2024: 1. Left ventricular ejection fraction, by estimation, is 55 to 60%. Left  ventricular ejection fraction by PLAX is 57 %. The left ventricle has  normal function. The left ventricle has no regional wall motion  abnormalities. There is mild left ventricular  hypertrophy. Left ventricular diastolic  parameters are consistent with  Grade I diastolic dysfunction (impaired relaxation).   2. Right ventricular systolic function is normal. The right ventricular  size is normal.   3. The mitral valve is normal in structure. Mild mitral valve  regurgitation.   4. The aortic valve is normal in structure. Aortic valve regurgitation is  mild.   5. The inferior vena cava is normal in size with greater than 50%  respiratory variability, suggesting right atrial pressure of 3 mmHg.   Comparison(s): 08/14/2019 EF 55-60%.  __________  Cardiac MRI 08/12/2021 ([facility]): 1. The left ventricle is lower limits of normal in size.  There is mild concentric LV wall hypertrophy.  Regional and global LV systolic function are normal.  The LVEF is calculated at 62%.   2. The right ventricle is normal in cavity size, wall thickness, and systolic function.  The His bundle  lead inserts into the basal RV side of the anteroseptum/membranous portion of the interventricular septum.   3. Both atria are normal in size.  The RA lead inserts into the RA appendage.   4. The aortic valve is trileaflet in morphology. There is no significant aortic valve stenosis or  regurgitation.   5.  There is trivial mitral regurgitation, trivial tricuspid regurgitation, and trivial pulmonic  regurgitation.   6. Delayed enhancement imaging is abnormal.    a.  There is focal, non-specific hyperenhancement vs fat (contiguous with fat in the pericardial  space tracking along a pericardial reflection) on the RV side of basal anteroseptum.    b.   There is focal, non-specific hyperenhancement invovling the inferior RV insertion site.    c.   There is a small focal region of hyperenhancement involving the basal anterior wall and distal inferior wall (non-specific, but could reflect distal secondary branch coronary atherosclerosis).  The regions of hyperenhancement are unchanged in size. There are no new regions of hyperenhancement.   There  are no regions of elevated native T1.  There are no focal regions of localized hypertrophy.   These findings are non-specific in nature and are not highly suggestive of cardiac sarcoidosis.  There is  no evidence of acute/active disease.   7. There is no evidence of an intracardiac thrombus.   8.  The pericardium is normal in thickness.  There is no significant pericardial effusion.  __________  See CV studies and Care Everywhere in Epic for more remote imaging.   EKG:  EKG is ordered today.  The EKG  ordered today demonstrates A-sensed, V-paced, rare PVC  Recent Labs: No results found for requested labs within last 365 days.  Recent Lipid Panel    Component Value Date/Time   CHOL 209 (H) 02/09/2024 1223   TRIG 98 02/09/2024 1223   HDL 51 02/09/2024 1223   CHOLHDL 4.1 02/09/2024 1223   LDLCALC 140 (H) 02/09/2024 1223    PHYSICAL EXAM:    VS:  BP 98/60 (BP Location: Left Arm, Patient Position: Sitting, Cuff Size: Normal)   Pulse 72 Comment: 85 oximeter  Ht 5' 9 (1.753 m)   Wt 162 lb 9.6 oz (73.8 kg)   SpO2 99%   BMI 24.01 kg/m   BMI: Body mass index is 24.01 kg/m.  Physical Exam Vitals reviewed.  Constitutional:      Appearance: He is well-developed.  HENT:     Head: Normocephalic and atraumatic.  Eyes:     General:        Right eye: No discharge.        Left eye: No discharge.  Cardiovascular:     Rate and Rhythm: Normal rate and regular rhythm.     Heart sounds: Normal heart sounds, S1 normal and S2 normal. Heart sounds not distant. No midsystolic click and no opening snap. No murmur heard.    No friction rub.  Pulmonary:     Effort: Pulmonary effort is normal. No respiratory distress.     Breath sounds: Normal breath sounds. No decreased breath sounds, wheezing, rhonchi or rales.  Musculoskeletal:     Cervical back: Normal range of motion.     Right lower leg: No edema.     Left lower leg: No edema.  Skin:    General: Skin is warm and dry.     Nails:  There is no clubbing.  Neurological:     Mental Status: He is alert and oriented to person, place, and time.  Psychiatric:        Speech: Speech normal.        Behavior: Behavior normal.        Thought Content: Thought content normal.        Judgment: Judgment normal.     Wt Readings from Last 3 Encounters:  06/05/24 162 lb 9.6 oz (73.8 kg)  02/09/24 160 lb 6 oz (72.7 kg)  12/16/23 166 lb (75.3 kg)     ASSESSMENT & PLAN:   Nonobstructive CAD: No symptoms suggestive of angina.  On apixaban  and lieu of aspirin  as outlined below.  Not on statin therapy given history of polymyositis with consideration to initiate nonstatin lipid-lowering pharmacotherapy and follow-up based on updated labs.  Prior cardiac cath and subsequent stress testing have shown no evidence of obstructive/ischemic heart disease.  Given lack of anginal symptoms at this time, and in the context of echo showing preserved LV systolic function with normal wall motion, no indication for further ischemic testing currently.  Complete heart block: Status post PPM.  Followed by EP.  No evidence of infiltrative cardiac disease on cardiac MRI.  History of Afib: Reported episode following PPM implantation in 2021.  No objective evidence of recurrence since.  CHADS2VASc at least 2 (age x 1, vascular disease).  After discussion with our EP team today, no EP indication for anticoagulation with recommendation to continue to monitor for A-fib recurrence.  Will defer ongoing management to EP.  History of DVT: Has been maintained on anticoagulation indefinitely with management of apixaban  deferred to PCP.  HLD: LDL 118 with target less  than 70.  Unable to take statins due to history of polymyositis.  He will work on improving heart healthy diet with follow-up fasting labs in 3 to 4 months.  If LDL remains above goal of less than 70 at that time revisit initiation of PCSK9 inhibitor versus bempedoic acid .  History of polymyositis: Stable.   Remains on low-dose prednisone.  Followed by rheumatology.  History of of hepatitis C with hepatocellular carcinoma: Child-Pugh class A.  Follow[facility] by [facility].      Disposition: F/u with Dr. Darliss or an APP in 4 months and EP as directed.    Medication Adjustments/Labs and Tests Ordered: Current medicines are reviewed at length with the patient today.  Concerns regarding medicines are outlined above. Medication changes, Labs and Tests ordered today are summarized above and listed in the Patient Instructions accessible in Encounters.   Signed, Bernardino Bring, PA-C 06/05/2024 5:15 PM     [facility] [address] [place], [place] [address] [phone]     [1]  Current Meds  Medication Sig   apixaban  (ELIQUIS ) 2.5 MG TABS tablet Take by mouth 2 (two) times daily.   ascorbic acid  (VITAMIN C) 500 MG tablet Take 500 mg by mouth 2 (two) times daily.    B Complex-Biotin-FA (VITAMIN B50 COMPLEX PO) Take by mouth daily.   cholecalciferol  (VITAMIN D3) 25 MCG (1000 UNIT) tablet Take 1,000 Units by mouth in the morning and at bedtime.    Coenzyme Q10 (CO Q 10) 100 MG CAPS Take 100 mg by mouth daily.   denosumab (PROLIA) 60 MG/ML SOSY injection Inject 60 mg into the skin every 6 (six) months.   magnesium  oxide (MAG-OX) 400 MG tablet Take 200 mg by mouth 2 (two) times daily.   Multiple Vitamin (MULTIVITAMIN WITH MINERALS) TABS tablet Take 1 tablet by mouth daily.   Omega-3 Fatty Acids (FISH OIL) 1000 MG CAPS Take 2 capsules by mouth daily.   predniSONE (DELTASONE) 5 MG tablet Take 5 mg by mouth daily with breakfast.   vitamin E  200 UNIT capsule Take 200 Units by mouth daily.   Whey Protein POWD Take 15 g by mouth daily.

## 2024-06-01 ENCOUNTER — Ambulatory Visit: Admitting: Cardiology

## 2024-06-01 ENCOUNTER — Ambulatory Visit

## 2024-06-01 DIAGNOSIS — I5032 Chronic diastolic (congestive) heart failure: Secondary | ICD-10-CM

## 2024-06-05 ENCOUNTER — Ambulatory Visit: Admission: RE | Admit: 2024-06-05 | Source: Ambulatory Visit

## 2024-06-05 ENCOUNTER — Encounter: Payer: Self-pay | Admitting: Physician Assistant

## 2024-06-05 ENCOUNTER — Other Ambulatory Visit: Payer: Self-pay | Admitting: Emergency Medicine

## 2024-06-05 ENCOUNTER — Ambulatory Visit

## 2024-06-05 ENCOUNTER — Ambulatory Visit
Admission: RE | Admit: 2024-06-05 | Discharge: 2024-06-05 | Disposition: A | Discharge status: Home / Self Care | Source: Ambulatory Visit | Attending: Internal Medicine | Admitting: Internal Medicine

## 2024-06-05 ENCOUNTER — Ambulatory Visit: Admitting: Physician Assistant

## 2024-06-05 VITALS — BP 98/60 | HR 72 | Ht 69.0 in | Wt 162.6 lb

## 2024-06-05 DIAGNOSIS — E7800 Pure hypercholesterolemia, unspecified: Secondary | ICD-10-CM

## 2024-06-05 DIAGNOSIS — I442 Atrioventricular block, complete: Secondary | ICD-10-CM

## 2024-06-05 DIAGNOSIS — I4891 Unspecified atrial fibrillation: Secondary | ICD-10-CM

## 2024-06-05 DIAGNOSIS — I2510 Atherosclerotic heart disease of native coronary artery without angina pectoris: Secondary | ICD-10-CM | POA: Diagnosis not present

## 2024-06-05 DIAGNOSIS — Z86718 Personal history of other venous thrombosis and embolism: Secondary | ICD-10-CM

## 2024-06-05 DIAGNOSIS — C220 Liver cell carcinoma: Secondary | ICD-10-CM | POA: Diagnosis not present

## 2024-06-05 DIAGNOSIS — R634 Abnormal weight loss: Secondary | ICD-10-CM

## 2024-06-05 DIAGNOSIS — E785 Hyperlipidemia, unspecified: Secondary | ICD-10-CM | POA: Diagnosis not present

## 2024-06-05 DIAGNOSIS — Z79899 Other long term (current) drug therapy: Secondary | ICD-10-CM

## 2024-06-05 DIAGNOSIS — Z9889 Other specified postprocedural states: Secondary | ICD-10-CM | POA: Insufficient documentation

## 2024-06-05 DIAGNOSIS — M3320 Polymyositis, organ involvement unspecified: Secondary | ICD-10-CM

## 2024-06-05 DIAGNOSIS — I868 Varicose veins of other specified sites: Secondary | ICD-10-CM | POA: Diagnosis not present

## 2024-06-05 DIAGNOSIS — K766 Portal hypertension: Secondary | ICD-10-CM | POA: Diagnosis not present

## 2024-06-05 DIAGNOSIS — Z950 Presence of cardiac pacemaker: Secondary | ICD-10-CM

## 2024-06-05 LAB — CUP PACEART REMOTE DEVICE CHECK
Battery Remaining Longevity: 32 mo
Battery Remaining Percentage: 35 %
Battery Voltage: 2.95 V
Brady Statistic AP VP Percent: 37 %
Brady Statistic AP VS Percent: 1 %
Brady Statistic AS VP Percent: 62 %
Brady Statistic AS VS Percent: 1 %
Brady Statistic RA Percent Paced: 36 %
Brady Statistic RV Percent Paced: 99 %
Date Time Interrogation Session: 20251212020013
Implantable Lead Connection Status: 753985
Implantable Lead Connection Status: 753985
Implantable Lead Implant Date: 20210224
Implantable Lead Implant Date: 20210224
Implantable Lead Location: 753859
Implantable Lead Location: 753860
Implantable Lead Model: 7842
Implantable Lead Serial Number: 1032895
Implantable Pulse Generator Implant Date: 20210224
Lead Channel Impedance Value: 360 Ohm
Lead Channel Impedance Value: 510 Ohm
Lead Channel Pacing Threshold Amplitude: 1.125 V
Lead Channel Pacing Threshold Amplitude: 1.25 V
Lead Channel Pacing Threshold Pulse Width: 1 ms
Lead Channel Pacing Threshold Pulse Width: 1 ms
Lead Channel Sensing Intrinsic Amplitude: 2.9 mV
Lead Channel Sensing Intrinsic Amplitude: 5.4 mV
Lead Channel Setting Pacing Amplitude: 1.375
Lead Channel Setting Pacing Amplitude: 2.5 V
Lead Channel Setting Pacing Pulse Width: 1 ms
Lead Channel Setting Sensing Sensitivity: 2 mV
Pulse Gen Model: 2272
Pulse Gen Serial Number: 9197143

## 2024-06-05 MED ORDER — IOHEXOL 300 MG/ML  SOLN
85.0000 mL | Freq: Once | INTRAMUSCULAR | Status: AC | PRN
  Administered 2024-06-05: 13:00:00 85 mL via INTRAVENOUS

## 2024-06-05 NOTE — Patient Instructions (Signed)
 Medication Instructions:  Your physician recommends that you continue on your current medications as directed. Please refer to the Current Medication list given to you today.   *If you need a refill on your cardiac medications before your next appointment, please call your pharmacy*  Lab Work: Your provider would like for you to return in 4 months to have the following labs drawn: fasting Lipid panel.   Please go to [facility] [address] ([facility] [address], [place] [address] You do not need an appointment.  They are open from 8 am- 4:30 pm.  Lunch from 1:00 pm- 2:00 pm You DO need to be fasting.   You may also go to one of the following [facility]:  [address] [place], [place] [address] Phone: [phone] Lab hours: Mon-Fri 8 am- 5 pm    Lunch 12 pm- 1 pm  [address] [place],  [place]  [address]  [place]  Phone: [phone] Lab hours: 7 am- 4 pm Lunch 12 pm-1 pm   [address] [place],  [place]  [address]  [place]  Phone: [phone] Lab hours: Mon-Fri 8 am- 5 pm    Lunch 12 pm- 1 pm  If you have labs (blood work) drawn today and your tests are completely normal, you will receive your results only by: MyChart Message (if you have MyChart) OR A paper copy in the mail If you have any lab test that is abnormal or we need to change your treatment, we will call you to review the results.  Follow-Up: At [facility], you and your health needs are our priority.  As part of our continuing mission to provide you with exceptional heart care, our providers are all part of one team.  This team includes your primary Cardiologist (physician) and Advanced Practice Providers or APPs (Physician Assistants and Nurse Practitioners) who all work together to provide you with the care you need, when you need it.  Your next appointment:   4 month(s)  Provider:   You may see Redell Cave, MD or Bernardino Bring, PA-C   Other Instructions Attachment for heart  healthy diet, DASH, and Med diet added

## 2024-06-07 ENCOUNTER — Ambulatory Visit: Payer: Self-pay | Admitting: Cardiology

## 2024-06-08 NOTE — Progress Notes (Signed)
 Remote PPM Transmission

## 2024-07-13 ENCOUNTER — Encounter: Payer: Self-pay | Admitting: Cardiology

## 2024-07-13 NOTE — Telephone Encounter (Signed)
 Regarding the patient's cardiac history, I am not aware of an absolute contraindication.  However, if there are any concerns, I again recommended the patient to reach out to the prescribing provider for ongoing discussion and monitoring of medication, as well as potential adverse effects/downstream abnormalities associated with medication use as we do not prescribe this medication.  Marcus, please have clinical pharmacy team review as well.

## 2024-07-18 DIAGNOSIS — C220 Liver cell carcinoma: Principal | ICD-10-CM

## 2024-08-31 ENCOUNTER — Ambulatory Visit

## 2024-09-05 ENCOUNTER — Ambulatory Visit: Admitting: Cardiology

## 2024-10-09 ENCOUNTER — Ambulatory Visit: Admitting: Physician Assistant

## 2024-11-30 ENCOUNTER — Ambulatory Visit

## 2025-03-01 ENCOUNTER — Ambulatory Visit
